# Patient Record
Sex: Male | Born: 1960 | Hispanic: Yes | Marital: Married | Smoking: Never smoker
Health system: Southern US, Community
[De-identification: ages and names within clinical notes are randomized; demographics above are authoritative.]

## PROBLEM LIST (undated history)

## (undated) DIAGNOSIS — E669 Obesity, unspecified: Secondary | ICD-10-CM

## (undated) DIAGNOSIS — M25512 Pain in left shoulder: Secondary | ICD-10-CM

## (undated) DIAGNOSIS — R61 Generalized hyperhidrosis: Secondary | ICD-10-CM

## (undated) DIAGNOSIS — N62 Hypertrophy of breast: Secondary | ICD-10-CM

## (undated) DIAGNOSIS — E785 Hyperlipidemia, unspecified: Secondary | ICD-10-CM

## (undated) DIAGNOSIS — K219 Gastro-esophageal reflux disease without esophagitis: Secondary | ICD-10-CM

## (undated) DIAGNOSIS — J45909 Unspecified asthma, uncomplicated: Secondary | ICD-10-CM

## (undated) DIAGNOSIS — G4730 Sleep apnea, unspecified: Secondary | ICD-10-CM

## (undated) DIAGNOSIS — R0602 Shortness of breath: Secondary | ICD-10-CM

## (undated) DIAGNOSIS — I10 Essential (primary) hypertension: Secondary | ICD-10-CM

## (undated) DIAGNOSIS — E119 Type 2 diabetes mellitus without complications: Secondary | ICD-10-CM

## (undated) DIAGNOSIS — Z9989 Dependence on other enabling machines and devices: Secondary | ICD-10-CM

## (undated) DIAGNOSIS — G4733 Obstructive sleep apnea (adult) (pediatric): Secondary | ICD-10-CM

## (undated) DIAGNOSIS — S3991XA Unspecified injury of abdomen, initial encounter: Secondary | ICD-10-CM

## (undated) HISTORY — DX: Pain in left shoulder: M25.512

## (undated) HISTORY — DX: Obstructive sleep apnea (adult) (pediatric): G47.33

## (undated) HISTORY — DX: Obesity, unspecified: E66.9

## (undated) HISTORY — DX: Shortness of breath: R06.02

## (undated) HISTORY — DX: Unspecified asthma, uncomplicated: J45.909

## (undated) HISTORY — DX: Type 2 diabetes mellitus without complications: E11.9

## (undated) HISTORY — DX: Hyperlipidemia, unspecified: E78.5

## (undated) HISTORY — DX: Gastro-esophageal reflux disease without esophagitis: K21.9

## (undated) HISTORY — DX: Unspecified injury of abdomen, initial encounter: S39.91XA

## (undated) HISTORY — DX: Sleep apnea, unspecified: G47.30

## (undated) HISTORY — DX: Generalized hyperhidrosis: R61

## (undated) HISTORY — DX: Dependence on other enabling machines and devices: Z99.89

## (undated) HISTORY — DX: Hypertrophy of breast: N62

---

## 2005-03-06 ENCOUNTER — Ambulatory Visit: Payer: Self-pay | Admitting: Internal Medicine

## 2005-03-07 ENCOUNTER — Ambulatory Visit: Payer: Self-pay | Admitting: Internal Medicine

## 2005-03-11 ENCOUNTER — Encounter: Admission: RE | Admit: 2005-03-11 | Discharge: 2005-03-11 | Payer: Self-pay | Admitting: Internal Medicine

## 2005-03-11 ENCOUNTER — Ambulatory Visit: Payer: Self-pay | Admitting: Internal Medicine

## 2005-04-03 ENCOUNTER — Ambulatory Visit: Payer: Self-pay | Admitting: Cardiology

## 2005-04-05 ENCOUNTER — Ambulatory Visit: Payer: Self-pay

## 2005-04-08 ENCOUNTER — Ambulatory Visit: Payer: Self-pay

## 2005-04-12 ENCOUNTER — Ambulatory Visit: Payer: Self-pay | Admitting: Cardiology

## 2005-04-16 ENCOUNTER — Ambulatory Visit: Payer: Self-pay | Admitting: Internal Medicine

## 2005-04-16 ENCOUNTER — Ambulatory Visit: Payer: Self-pay | Admitting: Cardiology

## 2005-04-16 ENCOUNTER — Ambulatory Visit (HOSPITAL_COMMUNITY): Admission: RE | Admit: 2005-04-16 | Discharge: 2005-04-16 | Payer: Self-pay | Admitting: Internal Medicine

## 2005-04-22 ENCOUNTER — Ambulatory Visit: Payer: Self-pay | Admitting: Cardiology

## 2005-04-22 ENCOUNTER — Inpatient Hospital Stay (HOSPITAL_BASED_OUTPATIENT_CLINIC_OR_DEPARTMENT_OTHER): Admission: RE | Admit: 2005-04-22 | Discharge: 2005-04-22 | Payer: Self-pay | Admitting: Cardiology

## 2005-05-08 ENCOUNTER — Ambulatory Visit: Payer: Self-pay | Admitting: Cardiology

## 2005-05-20 ENCOUNTER — Ambulatory Visit: Payer: Self-pay | Admitting: Hospitalist

## 2005-06-03 ENCOUNTER — Ambulatory Visit: Payer: Self-pay | Admitting: Internal Medicine

## 2006-06-07 DIAGNOSIS — R079 Chest pain, unspecified: Secondary | ICD-10-CM | POA: Insufficient documentation

## 2006-06-07 DIAGNOSIS — R0602 Shortness of breath: Secondary | ICD-10-CM | POA: Insufficient documentation

## 2006-06-07 DIAGNOSIS — E785 Hyperlipidemia, unspecified: Secondary | ICD-10-CM | POA: Insufficient documentation

## 2006-06-07 DIAGNOSIS — N62 Hypertrophy of breast: Secondary | ICD-10-CM | POA: Insufficient documentation

## 2006-09-03 ENCOUNTER — Telehealth: Payer: Self-pay | Admitting: Internal Medicine

## 2006-09-29 ENCOUNTER — Ambulatory Visit: Payer: Self-pay | Admitting: Hospitalist

## 2006-09-29 ENCOUNTER — Encounter (INDEPENDENT_AMBULATORY_CARE_PROVIDER_SITE_OTHER): Payer: Self-pay | Admitting: Unknown Physician Specialty

## 2006-09-29 DIAGNOSIS — R61 Generalized hyperhidrosis: Secondary | ICD-10-CM

## 2006-09-29 DIAGNOSIS — E1159 Type 2 diabetes mellitus with other circulatory complications: Secondary | ICD-10-CM | POA: Insufficient documentation

## 2006-09-29 DIAGNOSIS — I10 Essential (primary) hypertension: Secondary | ICD-10-CM

## 2006-09-29 DIAGNOSIS — M25519 Pain in unspecified shoulder: Secondary | ICD-10-CM

## 2006-09-29 LAB — CONVERTED CEMR LAB
ALT: 41 units/L (ref 0–53)
Albumin: 4.2 g/dL (ref 3.5–5.2)
CO2: 25 meq/L (ref 19–32)
Glucose, Bld: 91 mg/dL (ref 70–99)
LDL Cholesterol: 78 mg/dL (ref 0–99)
Potassium: 4 meq/L (ref 3.5–5.3)
Sodium: 140 meq/L (ref 135–145)
TSH: 1.215 microintl units/mL (ref 0.350–5.50)
Total Protein: 7.2 g/dL (ref 6.0–8.3)

## 2006-10-28 ENCOUNTER — Ambulatory Visit: Payer: Self-pay | Admitting: Pulmonary Disease

## 2006-11-18 ENCOUNTER — Ambulatory Visit (HOSPITAL_BASED_OUTPATIENT_CLINIC_OR_DEPARTMENT_OTHER): Admission: RE | Admit: 2006-11-18 | Discharge: 2006-11-18 | Payer: Self-pay | Admitting: Pulmonary Disease

## 2006-11-18 ENCOUNTER — Ambulatory Visit: Payer: Self-pay | Admitting: Pulmonary Disease

## 2006-12-18 ENCOUNTER — Ambulatory Visit: Payer: Self-pay | Admitting: Pulmonary Disease

## 2007-01-19 ENCOUNTER — Ambulatory Visit (HOSPITAL_BASED_OUTPATIENT_CLINIC_OR_DEPARTMENT_OTHER): Admission: RE | Admit: 2007-01-19 | Discharge: 2007-01-19 | Payer: Self-pay | Admitting: Pulmonary Disease

## 2007-01-19 ENCOUNTER — Ambulatory Visit: Payer: Self-pay | Admitting: Pulmonary Disease

## 2007-03-05 ENCOUNTER — Ambulatory Visit: Payer: Self-pay | Admitting: Pulmonary Disease

## 2007-05-21 ENCOUNTER — Encounter (INDEPENDENT_AMBULATORY_CARE_PROVIDER_SITE_OTHER): Payer: Self-pay | Admitting: *Deleted

## 2007-11-17 ENCOUNTER — Telehealth: Payer: Self-pay | Admitting: *Deleted

## 2007-11-27 ENCOUNTER — Encounter (INDEPENDENT_AMBULATORY_CARE_PROVIDER_SITE_OTHER): Payer: Self-pay | Admitting: *Deleted

## 2007-11-27 ENCOUNTER — Ambulatory Visit: Payer: Self-pay | Admitting: Internal Medicine

## 2007-11-27 DIAGNOSIS — G4733 Obstructive sleep apnea (adult) (pediatric): Secondary | ICD-10-CM

## 2007-11-27 LAB — CONVERTED CEMR LAB
AST: 13 units/L (ref 0–37)
Albumin: 4.4 g/dL (ref 3.5–5.2)
BUN: 13 mg/dL (ref 6–23)
Calcium: 9.4 mg/dL (ref 8.4–10.5)
Chloride: 106 meq/L (ref 96–112)
Creatinine, Ser: 0.91 mg/dL (ref 0.40–1.50)
Potassium: 4.2 meq/L (ref 3.5–5.3)
Sodium: 142 meq/L (ref 135–145)
Total Protein: 7.9 g/dL (ref 6.0–8.3)

## 2007-12-02 ENCOUNTER — Ambulatory Visit: Payer: Self-pay | Admitting: Hospitalist

## 2007-12-02 ENCOUNTER — Encounter (INDEPENDENT_AMBULATORY_CARE_PROVIDER_SITE_OTHER): Payer: Self-pay | Admitting: *Deleted

## 2007-12-02 LAB — CONVERTED CEMR LAB
Cholesterol: 140 mg/dL (ref 0–200)
HDL: 36 mg/dL — ABNORMAL LOW (ref >39)
Triglycerides: 130 mg/dL (ref <150)

## 2007-12-04 ENCOUNTER — Telehealth: Payer: Self-pay | Admitting: *Deleted

## 2008-07-06 ENCOUNTER — Telehealth: Payer: Self-pay | Admitting: Internal Medicine

## 2008-07-12 ENCOUNTER — Telehealth: Payer: Self-pay | Admitting: Internal Medicine

## 2008-10-18 ENCOUNTER — Telehealth: Payer: Self-pay | Admitting: Internal Medicine

## 2009-01-18 ENCOUNTER — Telehealth: Payer: Self-pay | Admitting: Internal Medicine

## 2009-02-23 ENCOUNTER — Encounter: Payer: Self-pay | Admitting: Internal Medicine

## 2009-02-23 ENCOUNTER — Ambulatory Visit: Payer: Self-pay | Admitting: Internal Medicine

## 2009-02-23 ENCOUNTER — Ambulatory Visit (HOSPITAL_COMMUNITY): Admission: RE | Admit: 2009-02-23 | Discharge: 2009-02-23 | Payer: Self-pay | Admitting: Internal Medicine

## 2010-03-21 ENCOUNTER — Ambulatory Visit: Payer: Self-pay | Admitting: Internal Medicine

## 2010-03-21 ENCOUNTER — Encounter: Payer: Self-pay | Admitting: Internal Medicine

## 2010-03-21 DIAGNOSIS — K219 Gastro-esophageal reflux disease without esophagitis: Secondary | ICD-10-CM

## 2010-03-21 LAB — CONVERTED CEMR LAB
ALT: 33 units/L (ref 0–53)
Albumin: 4.3 g/dL (ref 3.5–5.2)
Alkaline Phosphatase: 90 units/L (ref 39–117)
BUN: 11 mg/dL (ref 6–23)
CO2: 26 meq/L (ref 19–32)
Calcium: 9.5 mg/dL (ref 8.4–10.5)
Chloride: 105 meq/L (ref 96–112)
Cholesterol: 201 mg/dL — ABNORMAL HIGH (ref 0–200)
Creatinine, Ser: 0.89 mg/dL (ref 0.40–1.50)
Potassium: 4.6 meq/L (ref 3.5–5.3)
Sodium: 141 meq/L (ref 135–145)
Total Protein: 7.4 g/dL (ref 6.0–8.3)
VLDL: 36 mg/dL (ref 0–40)

## 2010-04-10 ENCOUNTER — Encounter: Payer: Self-pay | Admitting: Internal Medicine

## 2010-05-11 ENCOUNTER — Encounter: Payer: Self-pay | Admitting: Internal Medicine

## 2010-09-02 ENCOUNTER — Encounter: Payer: Self-pay | Admitting: Internal Medicine

## 2010-09-09 LAB — CONVERTED CEMR LAB
ALT: 29 U/L
ALT: 29 units/L (ref 0–53)
AST: 14 U/L
Albumin: 4.3 g/dL
Albumin: 4.3 g/dL (ref 3.5–5.2)
Alkaline Phosphatase: 94 U/L
BUN: 9 mg/dL
CO2: 26 meq/L
CO2: 26 meq/L (ref 19–32)
Calcium: 9.4 mg/dL
Calcium: 9.4 mg/dL (ref 8.4–10.5)
Chloride: 104 meq/L
Cholesterol: 147 mg/dL
Cholesterol: 147 mg/dL (ref 0–200)
Creatinine, Ser: 0.92 mg/dL
Glucose, Bld: 88 mg/dL
Glucose, Bld: 88 mg/dL (ref 70–99)
HCT: 49 %
HCT: 49 % (ref 39.0–52.0)
HDL: 41 mg/dL
Hemoglobin: 15.7 g/dL
Hemoglobin: 15.7 g/dL (ref 13.0–17.0)
LDL Cholesterol: 89 mg/dL
Lipase: 13 U/L
Lipase: 13 units/L (ref 0–75)
MCHC: 32 g/dL
MCV: 84.2 fL
Platelets: 273 K/uL
Potassium: 4.5 meq/L
RBC: 5.82 M/uL — ABNORMAL HIGH
RDW: 14.9 %
Sodium: 142 meq/L
Sodium: 142 meq/L (ref 135–145)
TSH: 0.774 u[IU]/mL
Total Bilirubin: 0.7 mg/dL
Total Bilirubin: 0.7 mg/dL (ref 0.3–1.2)
Total CHOL/HDL Ratio: 3.6
Total CHOL/HDL Ratio: 3.6
Total Protein: 7.2 g/dL
Total Protein: 7.2 g/dL (ref 6.0–8.3)
Triglycerides: 87 mg/dL
VLDL: 17 mg/dL
VLDL: 17 mg/dL (ref 0–40)
WBC: 11.7 10*3/microliter — ABNORMAL HIGH

## 2010-09-11 NOTE — Consult Note (Signed)
Summary: [facility]   Imported By: Louretta Parma 05/24/2010 15:57:11  _____________________________________________________________________  External Attachment:    Type:   Image     Comment:   External Document
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
## 2010-09-11 NOTE — Consult Note (Signed)
Summary: [facility]   Imported By: Louretta Parma 06/13/2010 12:21:48  _____________________________________________________________________  External Attachment:    Type:   Image     Comment:   External Document

## 2010-10-09 ENCOUNTER — Other Ambulatory Visit: Payer: Self-pay | Admitting: *Deleted

## 2010-10-09 MED ORDER — METOPROLOL TARTRATE 25 MG PO TABS: 25.0000 mg | ORAL_TABLET | Freq: Two times a day (BID) | ORAL | Status: DC

## 2010-10-09 MED ORDER — PRAVASTATIN SODIUM 80 MG PO TABS: 80.0000 mg | ORAL_TABLET | Freq: Every day | ORAL | Status: DC

## 2010-10-09 NOTE — Telephone Encounter (Signed)
Pt is 6 months overdue for appt, his daughter was instructed to make an appt for asap

## 2010-11-13 ENCOUNTER — Encounter: Payer: Self-pay | Admitting: Internal Medicine

## 2010-11-21 ENCOUNTER — Ambulatory Visit (INDEPENDENT_AMBULATORY_CARE_PROVIDER_SITE_OTHER): Payer: PRIVATE HEALTH INSURANCE | Admitting: Internal Medicine

## 2010-11-21 ENCOUNTER — Encounter: Payer: Self-pay | Admitting: Internal Medicine

## 2010-11-21 DIAGNOSIS — D72829 Elevated white blood cell count, unspecified: Secondary | ICD-10-CM

## 2010-11-21 DIAGNOSIS — E785 Hyperlipidemia, unspecified: Secondary | ICD-10-CM

## 2010-11-21 DIAGNOSIS — I10 Essential (primary) hypertension: Secondary | ICD-10-CM

## 2010-11-21 DIAGNOSIS — L83 Acanthosis nigricans: Secondary | ICD-10-CM

## 2010-11-21 DIAGNOSIS — K219 Gastro-esophageal reflux disease without esophagitis: Secondary | ICD-10-CM

## 2010-11-21 LAB — COMPREHENSIVE METABOLIC PANEL
Albumin: 4.3 g/dL (ref 3.5–5.2)
Calcium: 9.4 mg/dL (ref 8.4–10.5)
Total Protein: 6.9 g/dL (ref 6.0–8.3)

## 2010-11-21 LAB — GLUCOSE, CAPILLARY: Glucose-Capillary: 100 mg/dL — ABNORMAL HIGH (ref 70–99)

## 2010-11-21 MED ORDER — ATORVASTATIN CALCIUM 20 MG PO TABS: 20.0000 mg | ORAL_TABLET | Freq: Every day | ORAL | Status: DC

## 2010-11-21 MED ORDER — PANTOPRAZOLE SODIUM 40 MG PO TBEC: 40.0000 mg | DELAYED_RELEASE_TABLET | Freq: Every day | ORAL | Status: DC

## 2010-11-21 NOTE — Assessment & Plan Note (Signed)
Patient has been having acanthosis nigrans (AN) for the past 3 months, Some people are born with AN. It is sometimes caused by a hormonal or glandular disorder, such as diabetes. Eating too much of the wrong foods, especially starches and sugars, raises insulin levels. Most patients with AN have a high insulin level. Increased insulin activates insulin receptors in the skin and forces them to grow abnormally. This may help cause AN. Reducing insulin by a special diet can lead to a rapid improvement of the skin problem. Both sexes are affected equally. Rarely, AN is associated with a tumor. The type of AN associated with malignancy more often occurs in elderly people. However, cases have been reported in children with a rare kidney cancer called Wilms' tumor. Malignant AN affects all races equally.  We'll check a basic metabolic panel, complete blood count and liver function tests as a basic start, and we'll act further based on the results.

## 2010-11-21 NOTE — Assessment & Plan Note (Signed)
The patient was on pravastatin, however his LDL remains uncontrolled, I switched him to Lipitor, we'll recheck his fasting lipid panel in several months.

## 2010-11-21 NOTE — Assessment & Plan Note (Signed)
Patient has not been taking his medications regularly, I have encouraged patient to take metoprolol twice a day we'll reevaluate her next followup as patient may need adjustment of medication to obtain a target blood pressure

## 2010-11-21 NOTE — Progress Notes (Signed)
  Subjective:    Patient ID: Brian Reilly, male    DOB: November 12, 1960, 50 y.o.   MRN: [id number]  HPI  Patient is a 50 year old male with a past medical history below presents to clinic for routine followup, today he complains of darkening under his axillary region consistent with acanthosis nigricans. The patient is nondiabetic. Denies any fever chills, denies any weight loss or any other red flag symptoms.   Review of Systems  [all other systems reviewed and are negative       Objective:   Physical Exam  Constitutional: He is oriented to person, place, and time. He appears well-developed and well-nourished.  HENT:  Head: Normocephalic and atraumatic.  Eyes: Pupils are equal, round, and reactive to light.  Neck: Normal range of motion. No JVD present. No thyromegaly present.  Cardiovascular: Normal rate, regular rhythm and normal heart sounds.   Pulmonary/Chest: Effort normal and breath sounds normal. He has no wheezes. He has no rales.  Abdominal: Soft. Bowel sounds are normal. There is no tenderness. There is no rebound.  Musculoskeletal: Normal range of motion. He exhibits no edema.  Neurological: He is alert and oriented to person, place, and time.  Skin: Skin is warm and dry.             Assessment & Plan:

## 2010-11-21 NOTE — Patient Instructions (Signed)
Acanthosis Nigricans (AN) Acanthosis nigricans (AN) is a disorder that may begin at any age, including birth. It causes velvety, light brown, black, or grayish markings on the skin. They are usually found on the:  Face.   Neck.   Armpits.   Inner thighs.   Groin.  AN can be noncancerous (benign) or associated with cancer (malignant). Most often, AN is a benign condition. Benign AN is primarily associated with being overweight. In young people, insulin resistance is the most common association with AN. Insulin is the hormone that controls your blood sugar. Insulin resistance occurs when the body does not use its insulin properly. Benign AN may cause social problems, since the person may appear as if he or she has poor hygiene.  CAUSES Some people are born with AN. It is sometimes caused by a hormonal or glandular disorder, such as diabetes. Eating too much of the wrong foods, especially starches and sugars, raises insulin levels. Most patients with AN have a high insulin level. Increased insulin activates insulin receptors in the skin and forces them to grow abnormally. This may help cause AN. Reducing insulin by a special diet can lead to a rapid improvement of the skin problem. Both sexes are affected equally. Rarely, AN is associated with a tumor. The type of AN associated with malignancy more often occurs in elderly people. However, cases have been reported in children with a rare kidney cancer called Wilms' tumor. Malignant AN affects all races equally. SYMPTOMS AN usually does not cause symptoms. Most people who have AN are bothered primarily by its appearance. DIAGNOSIS When AN develops in people who are not overweight, medical tests are often done to find the cause. When AN is associated with malignancy, it is unusually severe. In those cases, AN can be seen in additional places, such as the lips or hands. AN associated with malignancy is linked to major problems because it is caused by the  presence of a cancer. The tumor is often aggressive and destructive. Benign AN has a good outcome. It is easily treated with good results. TREATMENT  Treatment to improve the appearance of AN includes prescription medicines (retinoids, 20% urea, alpha hydroxy acids, salicylic acid).   If overweight, avoiding starchy foods and sugars that raise the insulin level can help. Losing weight will also help decrease the appearance of AN tremendously.   Oral medicines are available that help decrease high insulin.  HOME CARE INSTRUCTIONS  If you are overweight, exercise and watch your diet to lose the extra weight.   Use medicines prescribed by your caregiver as instructed.  SEEK MEDICAL CARE IF: You develop an unexplained case of AN in adulthood. Document Released: 07/29/2005 Document Re-Released: 01/16/2010 [facility] Patient Information 2011 [place], [place].

## 2010-11-22 LAB — CBC WITH DIFFERENTIAL/PLATELET
Basophils Absolute: 0 10*3/uL (ref 0.0–0.1)
Basophils Relative: 0 % (ref 0–1)
Eosinophils Relative: 3 % (ref 0–5)
Lymphocytes Relative: 28 % (ref 12–46)
Lymphs Abs: 3.3 10*3/uL (ref 0.7–4.0)
MCH: 27.2 pg (ref 26.0–34.0)
MCHC: 32.8 g/dL (ref 30.0–36.0)
Neutrophils Relative %: 60 % (ref 43–77)
Platelets: 269 10*3/uL (ref 150–400)

## 2010-12-25 NOTE — Assessment & Plan Note (Signed)
[facility]                             PULMONARY OFFICE NOTE   Brian Reilly, Brian Reilly                      MRN:          [id number]  DATE:03/05/2007                            DOB:          26-Dec-1960    I saw Brian Reilly in followup today for his severe obstructive sleep  apnea.   Since his last visit, he has undergone a CPAP titration study which was  done on January 19, 2007, and he was titrated to a pressure setting of 12  with a reduction in his apnea-hypopnea index to 0.  He was observed in  both REM sleep and supine sleep at this pressure setting.   He has since been started on CPAP with a full face mask and heated  humidification.  He says that he feels like the mask is too tight around  his cheeks but otherwise is not having any difficulty using his mask.  He says he is using the machine on a nightly basis for the entire night  and has noticed a significant improvement in his sleep quality as well  as his energy level during the day.  He has also started a diet and  exercise program and has lost approximately 10 pounds since his initial  visit in May.  I have discussed with him various techniques to try and  improve the fitting of his mask.  If he is still having difficulty with  this, I have advised him to contact his home care company to try an  alternative mask.  Otherwise I have encouraged him to maintain  compliance with his CPAP as well as to keep up with his diet, exercise,  and weight reduction program.   I will follow up with him in 4-6 months.     Coralyn Helling, MD  Electronically Signed    VS/MedQ  DD: 03/05/2007  DT: 03/05/2007  Job #: [id number]   cc:   Artist Beach, MD

## 2010-12-25 NOTE — Assessment & Plan Note (Signed)
[facility]                             PULMONARY OFFICE NOTE   RIAD, WAGLEY                      MRN:          [id number]  DATE:12/18/2006                            DOB:          1960-10-06    PULMONARY FOLLOWUP VISIT   I saw Mr. Stratton in followup today after he had undergone his overnight  polysomnogram, which was done on November 18, 2006.   This showed that he had evidence for severe obstructive sleep apnea with  an overall apnea/hypopnea index of 51.9 and an oxygen nadir of 73%.  He  was scheduled for a split night study protocol, but did not meet  protocol criteria.   I reviewed the results of his sleep study with him.  I had discussed the  adverse consequences of untreated sleep apnea, including increased risk  of hypertension, coronary artery disease, cerebrovascular disease, and  diabetes.  I had also discussed with him the importance of diet,  exercise, and weight reduction, as well as the avoidance of alcohol and  sedatives.  Driving precautions were reviewed as well.  I had also  reviewed various treatment options for his sleep apnea, including CPAP  therapy, oral appliance, and surgical intervention.  Given the severity  of his sleep apnea with the significance of his oxygen desaturation, I  feel that CPAP therapy would be his best option.   I will, therefore, refer him back to the sleep lab for a CPAP titration  study.  I will initiate him on CPAP after review of this, and follow up  with him in the office to discuss this issue further.     Coralyn Helling, MD  Electronically Signed    VS/MedQ  DD: 12/18/2006  DT: 12/18/2006  Job #: [id number]   cc:   Artist Beach, MD

## 2010-12-25 NOTE — Procedures (Signed)
NAME:  Brian Reilly, POUNDERS NO.:  [id number]   MEDICAL RECORD NO.:  [id number]          PATIENT TYPE:  OUT   LOCATION:  SLEEP CENTER                 FACILITY:  [facility]   PHYSICIAN:  Coralyn Helling, MD        DATE OF BIRTH:  [birth year]   DATE OF STUDY:  01/19/2007                            NOCTURNAL POLYSOMNOGRAM   REFERRING PHYSICIAN:  Coralyn Helling, MD   INDICATION FOR STUDY:  This is an individual who had undergone an  overnight polysomnogram November 18, 2006 and was found to have an  apnea/hypopnea index of 51.9 and an oxygen saturation nadir of 73%.  He  is referred to the sleep lab for a CPAP titration study.   EPWORTH SLEEPINESS SCORE:  Fifteen.   MEDICATIONS:  Protonix, nitroglycerin, Lipitor, aspirin, and Metoprolol.   SLEEP ARCHITECTURE:  Total recording time was 453 minutes.  Total sleep  time was 306 minutes.  Sleep efficiency was 68% which is reduced.  Sleep  latency is 21 minutes. REM latency was 153 minutes.  The study was  notable for the lack of slow wave sleep.  The patient appeared to have  difficulty with sleep initiation initially due to respiratory events.  The patient slept in both the supine and non supine position.   RESPIRATORY DATA:  The average respiratory was 12.  The patient was  titrated from a CPAP pressure setting of 6 to 12 cm of water.  At a CPAP  pressure setting of 12 cm of water the apnea/hypopnea index was reduced  to 0.  At this pressure setting he was observed in both REM sleep and  supine sleep and snoring was eliminated.  It did however appear that he  continued to have some degree of air flow limitation during REM sleep  but this did not meet criteria to score as an apneic or hypopneic event.   OXYGEN DATA:  The baseline oxygenation was 95%. The oxygen saturation  nadir was 89%.  At a CPAP pressure setting of 12 cm of water the mean  oxygenation during non REM sleep was 94.5%, the mean oxygenation during  REM sleep was 94.5%, the  minimal oxygenation during non REM sleep was  92%, and the minimal oxygenation during REM sleep was 92%.   CARDIAC DATA:  The average heart rate was 63 and the rhythm strip showed  normal sinus rhythm with sinus bradycardic and PACs.   MOVEMENT-PARASOMNIA:  The periodic limb movement index was 0.  No other  abnormal behaviors were noted.   IMPRESSIONS-RECOMMENDATIONS:  This is a CPAP titration study.  At a CPAP  pressure setting of 12 cm of water the apnea/hypopnea index was reduced  to 0. At this pressure setting the patient was observed in both REM  sleep and supine sleep.  He did appear to have continued air flow  limitation during REM sleep but this did not meet  criteria to score as an apneic/hypopneic event.  If he is still having  difficulty after being initiated on CPAP therapy then he may need to  have a further titration study done.      Coralyn Helling, MD  Diplomat,  American Board of Sleep Medicine  Electronically Signed     VS/MEDQ  D:  01/24/2007 11:34:03  T:  01/24/2007 21:52:34  Job:  [id number]

## 2010-12-28 NOTE — Procedures (Signed)
NAME:  Brian Reilly, Brian Reilly NO.:  [id number]   MEDICAL RECORD NO.:  [id number]          PATIENT TYPE:  OUT   LOCATION:  SLEEP CENTER                 FACILITY:  [facility]   PHYSICIAN:  Coralyn Helling, MD        DATE OF BIRTH:  [birth year]   DATE OF STUDY:  11/18/2006                            NOCTURNAL POLYSOMNOGRAM   REFERRING PHYSICIAN:  Coralyn Helling, MD   FACILITY:  [facility].   INDICATION FOR STUDY:  This individual has a history of hypertension,  snoring, and excessive daytime sleepiness.  He is referred to the sleep  lab for evaluation of hypersomnia with obstructive sleep apnea.   EPWORTH SLEEPINESS SCORE:  15.   MEDICATIONS:  Protonix, Lipitor, metoprolol, nitroglycerin, and aspirin.   SLEEP ARCHITECTURE:  Total recording time was 475 minutes.  Total sleep  time was 329 minutes.  Sleep efficiency was 69% which is reduced.  Sleep  latency was 9.5 minutes. REM latency was 306 minutes which is prolonged.  This study was notable for a lack of slow wave sleep and a reduction in  the percentage of REM sleep to 12% of the study.  The patient was  scheduled for a split night study protocol; however, due to insufficient  sleep during the first half of the study, he did not meet protocol  criteria.   RESPIRATORY DATA:  The average respiratory rate was 15.  The overall  apnea/hypopnea index was 51.9.  The events were exclusively obstructive  in nature.  Loud snoring was noted by the technician.  The non-supine  Apnea/hypopnea index was 55.6.  The supine Apnea/hypopnea index was  34.9.  The non-REM Apnea/hypopnea index was 47.1.  The REM  Apnea/hypopnea index was 85.9.   OXYGEN DATA:  The baseline oxygenation was 92%.  The oxygen saturation  nadir was 73%.  The patient spent a total of 369 minutes with an oxygen  saturation between 91-100%, 104 minutes with an oxygen saturation  between 81-90%, and 0.4 minutes with an oxygen saturation between 71-  80%.   CARDIAC DATA:  The average heart rate was 56, and the rhythm strip  showed normal sinus rhythm with sinus bradycardia.   MOVEMENT-PARASOMNIA:  The periodic limb movement index was zero.   IMPRESSIONS-RECOMMENDATIONS:  This study shows evidence for severe  obstructive sleep apnea as demonstrated by an Apnea/hypopnea index of  51.9 and oxygen saturation nadir of 73%.  The patient should be  counseled with regards to the importance of diet, exercise, and weight  reduction.  Given the severity of his sleep  apnea in addition to the significant oxygen desaturations, consideration  should be given to having patient undergo CPAP therapy.      Coralyn Helling, MD  Diplomat, American Board of Sleep Medicine  Electronically Signed     VS/MEDQ  D:  11/23/2006 15:03:16  T:  11/23/2006 17:13:15  Job:  [id number]

## 2010-12-28 NOTE — Cardiovascular Report (Signed)
NAME:  Brian Reilly, TOOKER NO.:  [id number]   MEDICAL RECORD NO.:  [id number]          PATIENT TYPE:  OIB   LOCATION:  6501                         FACILITY:  [facility]   PHYSICIAN:  Rollene Rotunda, M.D.   DATE OF BIRTH:  1961-07-15   DATE OF PROCEDURE:  04/22/2005  DATE OF DISCHARGE:                              CARDIAC CATHETERIZATION   PRIMARY CARE PHYSICIAN:  Artist Beach, M.D., [facility].   PROCEDURE:  Left heart catheterization/coronary arteriography.   INDICATION:  Patient with an abnormal Cardiolite, suggesting mild ischemia  inferior wall at the base.  The patient had chest pain.   DESCRIPTION OF PROCEDURE:  Left heart catheterization was performed via the  right femoral artery and the artery was cannulated using an anterior wall  puncture.  A #4 French arterial sheath was inserted via modified Seldinger  technique.  Preformed Judkins and pigtail catheters were utilized.  The  patient tolerated the procedure well and left the lab in stable condition.   HEMODYNAMIC RESULTS:  LV 124/102, aorta 129/88.   Coronaries:  The left main was normal.  The LAD was normal.  The first  diagonal was large and normal.  The second diagonal was small and normal.  The third diagonal was small and normal.  The circumflex in the AV groove  was normal.  There was a large ramus intermedius which was normal.  The OM1  was large and normal.  The OM2 was large and normal.  The right coronary  artery was a large dominant vessel.  There was a moderate sized PDA which  was normal.  There was a moderate to small posterolateral which was normal.   LEFT VENTRICULOGRAM:  A left ventriculogram was obtained in the RAO  projection.  EF was 65% with normal wall motion.   CONCLUSION:  Normal coronaries.  Normal left ventricular function.   PLAN:  No further cardiac workup is suggested.  The patient can return to  their primary care physician for further workup of  non-anginal chest  discomfort.  He should continue aggressive coronary risk reduction.           ______________________________  Rollene Rotunda, M.D.     JH/MEDQ  D:  04/22/2005  T:  04/22/2005  Job:  [id number]
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
## 2011-06-07 ENCOUNTER — Other Ambulatory Visit: Payer: Self-pay | Admitting: Internal Medicine

## 2011-07-05 ENCOUNTER — Other Ambulatory Visit: Payer: Self-pay | Admitting: Internal Medicine

## 2011-07-09 ENCOUNTER — Other Ambulatory Visit: Payer: Self-pay | Admitting: *Deleted

## 2011-07-10 MED ORDER — METOPROLOL TARTRATE 25 MG PO TABS: 25.0000 mg | ORAL_TABLET | Freq: Two times a day (BID) | ORAL | Status: DC

## 2011-10-01 ENCOUNTER — Other Ambulatory Visit: Payer: Self-pay | Admitting: Internal Medicine

## 2011-10-16 ENCOUNTER — Ambulatory Visit (INDEPENDENT_AMBULATORY_CARE_PROVIDER_SITE_OTHER): Payer: PRIVATE HEALTH INSURANCE | Admitting: Internal Medicine

## 2011-10-16 ENCOUNTER — Encounter: Payer: Self-pay | Admitting: Internal Medicine

## 2011-10-16 VITALS — BP 140/83 | HR 66 | Temp 98.4°F | Ht 74.0 in | Wt 358.7 lb

## 2011-10-16 DIAGNOSIS — K219 Gastro-esophageal reflux disease without esophagitis: Secondary | ICD-10-CM

## 2011-10-16 DIAGNOSIS — E785 Hyperlipidemia, unspecified: Secondary | ICD-10-CM

## 2011-10-16 DIAGNOSIS — I10 Essential (primary) hypertension: Secondary | ICD-10-CM

## 2011-10-16 LAB — COMPLETE METABOLIC PANEL WITH GFR
ALT: 25 U/L (ref 0–53)
AST: 15 U/L (ref 0–37)
Alkaline Phosphatase: 103 U/L (ref 39–117)
BUN: 8 mg/dL (ref 6–23)
CO2: 24 mEq/L (ref 19–32)
Calcium: 9.1 mg/dL (ref 8.4–10.5)
Chloride: 106 mEq/L (ref 96–112)
Creat: 0.73 mg/dL (ref 0.50–1.35)
GFR, Est Non African American: >89 mL/min
Glucose, Bld: 106 mg/dL — ABNORMAL HIGH (ref 70–99)
Sodium: 142 mEq/L (ref 135–145)
Total Protein: 6.8 g/dL (ref 6.0–8.3)

## 2011-10-16 LAB — LIPID PANEL
HDL: 39 mg/dL — ABNORMAL LOW (ref >39)
Total CHOL/HDL Ratio: 3.9 Ratio
Triglycerides: 92 mg/dL (ref <150)

## 2011-10-16 MED ORDER — PANTOPRAZOLE SODIUM 40 MG PO TBEC: 40.0000 mg | DELAYED_RELEASE_TABLET | Freq: Every day | ORAL | Status: DC

## 2011-10-16 MED ORDER — METOPROLOL TARTRATE 25 MG PO TABS: 25.0000 mg | ORAL_TABLET | Freq: Two times a day (BID) | ORAL | Status: DC

## 2011-10-16 MED ORDER — SIMVASTATIN 40 MG PO TABS: 40.0000 mg | ORAL_TABLET | Freq: Every evening | ORAL | Status: DC

## 2011-10-16 NOTE — Assessment & Plan Note (Signed)
Refill medication today. 

## 2011-10-16 NOTE — Progress Notes (Signed)
  Subjective:    Patient ID: Brian Reilly, male    DOB: 1960-12-29, 51 y.o.   MRN: [id number]  HPI  Mr Brian Reilly is here for a regular check up.  His BP is well controlled at this time.  Patient had difficulty with metoprolol refills and is requesting a year supply.   Review of Systems  Constitutional: Negative for fever, activity change and appetite change.  HENT: Negative for sore throat.   Respiratory: Negative for cough and shortness of breath.   Cardiovascular: Negative for chest pain and leg swelling.  Gastrointestinal: Negative for nausea, abdominal pain, diarrhea, constipation and abdominal distention.  Genitourinary: Negative for frequency, hematuria and difficulty urinating.  Neurological: Negative for dizziness and headaches.  Psychiatric/Behavioral: Negative for suicidal ideas and behavioral problems.       Objective:   Physical Exam  Constitutional: He is oriented to person, place, and time. He appears well-developed and well-nourished.  HENT:  Head: Normocephalic and atraumatic.  Eyes: Conjunctivae and EOM are normal. Pupils are equal, round, and reactive to light. No scleral icterus.  Neck: Normal range of motion. Neck supple. No JVD present. No thyromegaly present.  Cardiovascular: Normal rate, regular rhythm, normal heart sounds and intact distal pulses.  Exam reveals no gallop and no friction rub.   No murmur heard. Pulmonary/Chest: Effort normal and breath sounds normal. No respiratory distress. He has no wheezes. He has no rales.  Abdominal: Soft. Bowel sounds are normal. He exhibits no distension and no mass. There is no tenderness. There is no rebound and no guarding.  Musculoskeletal: Normal range of motion. He exhibits no edema and no tenderness.  Lymphadenopathy:    He has no cervical adenopathy.  Neurological: He is alert and oriented to person, place, and time.  Psychiatric: He has a normal mood and affect. His behavior is normal.            Assessment & Plan:

## 2011-10-16 NOTE — Patient Instructions (Signed)
Obesidad (Obesity) Se define la obesidad como tener un ndice de [facility]) de 30 o ms. Para calcular su IMC, divida su peso en libras sobre su altura en pulgadas al cuadrado y multiplique eso por 703. Las enfermedades ms importantes relacionadas con la obesidad a largo plazo incluyen:  Ictus.   Enfermedades cardacas.   Diabetes.   Distintos tipo de cncer.   Artritis.  La obesidad tambin complica la recuperacin de otros problemas mdicos.  CAUSAS  Historia de obesidad en sus padres.   Desequilibrio de hormona tiroidea.   Factores ambientales tales como el exceso de ingesta de caloras y sedentarismo.  TRATAMIENTO Un programa de prdida de peso saludable incluye:  Una dieta baja en caloras basada en las necesidades calricas individuales.   Aumento de la actividad fsica (ejercicio).  Un programa de ejercicios es tan importante como una dieta baja en caloras.  Los medicamentos para bajar de peso deberan utilizarse slo bajo supervisin de su mdico. Estos medicamentos ayudan, pero slo si se utilizan con programas ejercicio y Production assistant, radio. Los medicamentos pueden tener efectos adversos que incluyen nerviosismo, nuseas, dolor abdominal, diarrea, dolor de [place], adormecimiento y depresin.  Un programa de prdida de peso no saludable incluye:  Ayunar.   Programas de dieta que estn a la moda.   Suplementos y drogas.  Estas opciones fallan al lograr controlar el peso a largo plazo.  INSTRUCCIONES PARA EL CUIDADO DOMICILIARIO Para ayudarlo a realizar los cambios necesarios en su dieta:   Realice los ejercicios slo en la forma indicada por el profesional que lo asiste.   Mantenga un registro diario de todo lo que come. Existen muchos sitios web gratuitos que lo ayudarn a Administrator, sports. Puede ser til [employer] que consume para determinar si las porciones que est comiendo son del tamao correcto.   Utilice recetarios de alimentos bajos en caloras o tome  clases especiales de cocina.   Evite el alcohol. Tome ms agua y bebidas sin caloras.   Tome las vitaminas y los suplementos segn se lo ha prescrito el profesional que lo asiste.   Los grupos de apoyo de prdida de [place], los dietistas registrados, orientadores y Publishing rights manager para reduccin del estrs tambin puede ser de [place].  Document Released: 07/29/2005 Document Revised: 07/18/2011 [facility] Patient Information 2012 [place], [place].

## 2011-10-16 NOTE — Assessment & Plan Note (Signed)
Well controlled. BMP today. Patient advised to lose weight. RTC in 1 year.

## 2011-10-16 NOTE — Assessment & Plan Note (Signed)
Check lipid profile today and hepatic function. Will change to zocor 40 as patient is concerned about the cost of medication.

## 2012-02-08 ENCOUNTER — Ambulatory Visit (INDEPENDENT_AMBULATORY_CARE_PROVIDER_SITE_OTHER): Payer: Commercial Managed Care - PPO | Admitting: Internal Medicine

## 2012-02-08 VITALS — BP 148/93 | HR 70 | Temp 98.4°F | Resp 24 | Ht 74.0 in | Wt 359.0 lb

## 2012-02-08 DIAGNOSIS — I10 Essential (primary) hypertension: Secondary | ICD-10-CM

## 2012-02-08 DIAGNOSIS — R079 Chest pain, unspecified: Secondary | ICD-10-CM

## 2012-02-08 DIAGNOSIS — M79601 Pain in right arm: Secondary | ICD-10-CM

## 2012-02-08 DIAGNOSIS — R209 Unspecified disturbances of skin sensation: Secondary | ICD-10-CM

## 2012-02-08 LAB — COMPREHENSIVE METABOLIC PANEL
ALT: 47 U/L (ref 0–53)
AST: 28 U/L (ref 0–37)
Albumin: 4.3 g/dL (ref 3.5–5.2)
Alkaline Phosphatase: 102 U/L (ref 39–117)
CO2: 27 mEq/L (ref 19–32)
Calcium: 9.4 mg/dL (ref 8.4–10.5)
Chloride: 105 mEq/L (ref 96–112)
Total Bilirubin: 0.8 mg/dL (ref 0.3–1.2)
Total Protein: 7.2 g/dL (ref 6.0–8.3)

## 2012-02-08 LAB — POCT CBC
Granulocyte percent: 61.4 %G (ref 37–80)
MCH, POC: 26.4 pg — AB (ref 27–31.2)
MID (cbc): 0.6 (ref 0–0.9)
POC Granulocyte: 5.8 (ref 2–6.9)
POC MID %: 6.8 %M (ref 0–12)
RBC: 6.03 M/uL (ref 4.69–6.13)

## 2012-02-08 MED ORDER — MELOXICAM 15 MG PO TABS: 15.0000 mg | ORAL_TABLET | Freq: Every day | ORAL | Status: DC

## 2012-02-08 MED ORDER — CYCLOBENZAPRINE HCL 10 MG PO TABS: 10.0000 mg | ORAL_TABLET | Freq: Every day | ORAL | Status: AC

## 2012-02-08 NOTE — Progress Notes (Addendum)
Subjective:    Patient ID: Brian Reilly, male    DOB: [birth year], 51 y.o.   MRN: [id number] Called to see patient urgently due to chest pain and numbness HPIThree-day history of chest tightness with numbness in the left shoulder arm and hand that comes and goes/also has some numbness in the right arm and hand intermittently Has mild dyspnea on exertion but this is unchanged History of hypertension and hyperlipidemia but no heart problems today History of sleep apnea uses mask Has morbid obesity Has reflux No history of diabetes Current symptoms do not include diaphoresis, actual chest pain, shortness of breath at rest, weakness or dizziness, voice changes, dysphasia, memory changes, loss of strength in the upper or lower extremities No history of a neck injury   Review of Systems Primary care [facility] Patient Active Problem List  Diagnosis  . HYPERLIPIDEMIA  . MORBID OBESITY  . OBSTRUCTIVE SLEEP APNEA  . HYPERTENSION, ESSENTIAL NOS  . GERD  . Acanthosis nigricans       Objective:   Physical Exam Filed Vitals:   02/08/12 0925  BP: 148/93  Pulse: 70  Temp: 98.4 F (36.9 C)  Resp: 24  Weight 359 pounds In no acute distress Pupils equal round reactive to light and accommodation/EOMs conjugate No thyromegaly Neck has discomfort with extension but has good flexion Mildly tender in the left trapezius Heart is regular without murmurs rubs or gallops Lungs are clear There is 1+ pitting edema of the lower Extremities without hyperpigmentation Peripheral pulses full Regulars 2 through 12 intact There is no decrease in sensation at the current moment to exam Of either arm Tinel's is negative Motor function good in all extremities Deep tendon reflexes are symmetrical Cerebellar intact Gait normal  EKG within normal limits Results for orders placed in visit on 02/08/12  POCT CBC      Component Value Range   WBC 9.4  4.6 - 10.2 K/uL   Lymph, poc 3.0   0.6 - 3.4   POC LYMPH PERCENT 31.8  10 - 50 %L   MID (cbc) 0.6  0 - 0.9   POC MID % 6.8  0 - 12 %M   POC Granulocyte 5.8  2 - 6.9   Granulocyte percent 61.4  37 - 80 %G   RBC 6.03  4.69 - 6.13 M/uL   Hemoglobin 15.9  14.1 - 18.1 g/dL   HCT, POC 40.9  81.1 - 53.7 %   MCV 87.1  80 - 97 fL   MCH, POC 26.4 (*) 27 - 31.2 pg   MCHC 30.3 (*) 31.8 - 35.4 g/dL   RDW, POC 91.4     Platelet Count, POC 225  142 - 424 K/uL   MPV 11.4  0 - 99.8 fL  GLUCOSE, POCT (MANUAL RESULT ENTRY)      Component Value Range   POC Glucose 119 (*) 70 - 99 mg/dl    Assessment & Plan:  Problem #1 chest pain Problem #2 paresthesias-uncertain etiology  Patient Instructions  Your EKG is normal and there is no indication of heart problems your neurological exam is intact and there is no indication of a stroke These intermittent symptoms suggests I nerve pressure and the most likely origin would be your neck with a bony problem like arthritis We have done 2 other tests to consider metabolic causes and we'll call you with those results in the next 2 days for mail you a letter if everything is normal If the medications  prescribed do not work quickly, then the next step would be a neurological evaluation And you may arrange this by going back to the internal medicine clinic at [facility] to see your regular doctor  Awaiting TSH and CMET Meds ordered this encounter  Medications  . meloxicam (MOBIC) 15 MG tablet    Sig: Take 1 tablet (15 mg total) by mouth daily.    Dispense:  30 tablet    Refill:  0  . cyclobenzaprine (FLEXERIL) 10 MG tablet    Sig: Take 1 tablet (10 mg total) by mouth at bedtime.    Dispense:  30 tablet    Refill:  0   Reevaluate symptoms in one week if not improved with medication and range of motion exercises

## 2012-02-08 NOTE — Addendum Note (Signed)
Addended by: Tonye Pearson on: 02/08/2012 12:27 PM   Modules accepted: Level of Service

## 2012-02-08 NOTE — Patient Instructions (Addendum)
Your EKG is normal and there is no indication of heart problems your neurological exam is intact and there is no indication of a stroke These intermittent symptoms suggests I nerve pressure and the most likely origin would be your neck with a bony problem like arthritis We have done 2 other tests to consider metabolic causes and we'll call you with those results in the next 2 days for mail you a letter if everything is normal If the medications prescribed do not work quickly, then the next step would be a neurological evaluation And you may arrange this by going back to the internal medicine clinic at [facility] to see your regular doctor

## 2012-02-11 ENCOUNTER — Encounter: Payer: Self-pay | Admitting: Internal Medicine

## 2012-04-20 ENCOUNTER — Emergency Department (HOSPITAL_COMMUNITY)
Admission: EM | Admit: 2012-04-20 | Discharge: 2012-04-20 | Disposition: A | Discharge status: Nursing Home (Includes ICF) | Payer: Commercial Managed Care - PPO | Source: Home / Self Care | Attending: Family Medicine | Admitting: Family Medicine

## 2012-04-20 ENCOUNTER — Encounter (HOSPITAL_COMMUNITY): Payer: Self-pay | Admitting: *Deleted

## 2012-04-20 DIAGNOSIS — K219 Gastro-esophageal reflux disease without esophagitis: Secondary | ICD-10-CM | POA: Insufficient documentation

## 2012-04-20 DIAGNOSIS — E669 Obesity, unspecified: Secondary | ICD-10-CM | POA: Insufficient documentation

## 2012-04-20 DIAGNOSIS — L03119 Cellulitis of unspecified part of limb: Secondary | ICD-10-CM | POA: Insufficient documentation

## 2012-04-20 DIAGNOSIS — E785 Hyperlipidemia, unspecified: Secondary | ICD-10-CM | POA: Insufficient documentation

## 2012-04-20 DIAGNOSIS — R600 Localized edema: Secondary | ICD-10-CM

## 2012-04-20 DIAGNOSIS — L02419 Cutaneous abscess of limb, unspecified: Secondary | ICD-10-CM | POA: Insufficient documentation

## 2012-04-20 DIAGNOSIS — R609 Edema, unspecified: Secondary | ICD-10-CM

## 2012-04-20 DIAGNOSIS — G4730 Sleep apnea, unspecified: Secondary | ICD-10-CM | POA: Insufficient documentation

## 2012-04-20 LAB — URINALYSIS, ROUTINE W REFLEX MICROSCOPIC
Hgb urine dipstick: NEGATIVE
Leukocytes, UA: NEGATIVE
Protein, ur: 30 mg/dL — AB
Specific Gravity, Urine: 1.036 — ABNORMAL HIGH (ref 1.005–1.030)
pH: 6 (ref 5.0–8.0)

## 2012-04-20 LAB — COMPREHENSIVE METABOLIC PANEL
AST: 27 U/L (ref 0–37)
BUN: 12 mg/dL (ref 6–23)
Chloride: 101 mEq/L (ref 96–112)
Creatinine, Ser: 0.76 mg/dL (ref 0.50–1.35)
GFR calc Af Amer: >90 mL/min (ref >90)
Glucose, Bld: 214 mg/dL — ABNORMAL HIGH (ref 70–99)
Potassium: 3.4 mEq/L — ABNORMAL LOW (ref 3.5–5.1)
Sodium: 137 mEq/L (ref 135–145)

## 2012-04-20 LAB — CBC WITH DIFFERENTIAL/PLATELET
Eosinophils Absolute: 0.2 10*3/uL (ref 0.0–0.7)
Eosinophils Relative: 2 % (ref 0–5)
MCHC: 33.3 g/dL (ref 30.0–36.0)
Neutrophils Relative %: 60 % (ref 43–77)

## 2012-04-20 NOTE — ED Notes (Signed)
Pt  Reports  About  1  Week  Of  Swelling  Of  both his  L;ower  Legs    -  He  denys  Any  Chest pain  He  denys  Any     Shortness of  Breath         He  Ambulated to  The  Exam  Room  With a  Slow  Steady  Gait   -  He  Is  Speaking in  Complete  Slow  sentances      He  Has    A  blister to his  r  Lower  Shin area  denys  Any  specefic  Burn or  Event    He  Has  Some  Edema of the  extremitys  present
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
## 2012-04-20 NOTE — ED Notes (Signed)
The pt has had bi-lateral lower extremity swelling and redness for several days.  Blister to the rt lower leg for 3 days

## 2012-04-21 ENCOUNTER — Emergency Department (HOSPITAL_COMMUNITY)
Admission: EM | Admit: 2012-04-21 | Discharge: 2012-04-21 | Disposition: A | Discharge status: Home / Self Care | Payer: Commercial Managed Care - PPO | Attending: Emergency Medicine | Admitting: Emergency Medicine

## 2012-04-21 DIAGNOSIS — L03115 Cellulitis of right lower limb: Secondary | ICD-10-CM

## 2012-04-21 LAB — PRO B NATRIURETIC PEPTIDE: Pro B Natriuretic peptide (BNP): 12.3 pg/mL (ref 0–125)

## 2012-04-21 MED ORDER — CEPHALEXIN 500 MG PO CAPS: 500.0000 mg | ORAL_CAPSULE | Freq: Four times a day (QID) | ORAL | Status: AC

## 2012-04-21 MED ORDER — SULFAMETHOXAZOLE-TRIMETHOPRIM 800-160 MG PO TABS: 1.0000 | ORAL_TABLET | Freq: Two times a day (BID) | ORAL | Status: AC

## 2012-04-21 MED ORDER — CEFTRIAXONE SODIUM 1 G IJ SOLR
1.0000 g | Freq: Once | INTRAMUSCULAR | Status: AC
  Administered 2012-04-21: 1 g via INTRAMUSCULAR
  Filled 2012-04-21: qty 10

## 2012-04-21 MED ORDER — LIDOCAINE HCL (PF) 1 % IJ SOLN
INTRAMUSCULAR | Status: AC
  Administered 2012-04-21: 2 mL
  Filled 2012-04-21: qty 5

## 2012-04-21 MED ORDER — NAPROXEN 500 MG PO TABS: 500.0000 mg | ORAL_TABLET | Freq: Two times a day (BID) | ORAL | Status: DC

## 2012-04-21 MED ORDER — SULFAMETHOXAZOLE-TMP DS 800-160 MG PO TABS
1.0000 | ORAL_TABLET | Freq: Once | ORAL | Status: AC
  Administered 2012-04-21: 1 via ORAL
  Filled 2012-04-21: qty 1
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
## 2012-04-21 NOTE — ED Notes (Signed)
Pt. Sent here for eval of bilateral leg swelling. Pt. Legs swollen, blister on right lower extremity, with redness surrounding.

## 2012-05-08 ENCOUNTER — Encounter: Payer: Self-pay | Admitting: Internal Medicine

## 2012-05-08 ENCOUNTER — Ambulatory Visit (INDEPENDENT_AMBULATORY_CARE_PROVIDER_SITE_OTHER): Payer: Commercial Managed Care - PPO | Admitting: Internal Medicine

## 2012-05-08 ENCOUNTER — Encounter: Payer: Commercial Managed Care - PPO | Admitting: Internal Medicine

## 2012-05-08 ENCOUNTER — Other Ambulatory Visit: Payer: Self-pay | Admitting: Internal Medicine

## 2012-05-08 VITALS — BP 131/84 | HR 75 | Temp 98.3°F | Ht 74.0 in | Wt 379.1 lb

## 2012-05-08 DIAGNOSIS — R600 Localized edema: Secondary | ICD-10-CM

## 2012-05-08 DIAGNOSIS — I10 Essential (primary) hypertension: Secondary | ICD-10-CM

## 2012-05-08 DIAGNOSIS — R609 Edema, unspecified: Secondary | ICD-10-CM

## 2012-05-08 DIAGNOSIS — G4733 Obstructive sleep apnea (adult) (pediatric): Secondary | ICD-10-CM

## 2012-05-08 DIAGNOSIS — E876 Hypokalemia: Secondary | ICD-10-CM

## 2012-05-08 DIAGNOSIS — K219 Gastro-esophageal reflux disease without esophagitis: Secondary | ICD-10-CM

## 2012-05-08 DIAGNOSIS — E785 Hyperlipidemia, unspecified: Secondary | ICD-10-CM

## 2012-05-08 MED ORDER — POTASSIUM CHLORIDE ER 10 MEQ PO TBCR: 10.0000 meq | EXTENDED_RELEASE_TABLET | Freq: Every day | ORAL | Status: DC

## 2012-05-08 MED ORDER — FUROSEMIDE 40 MG PO TABS: 40.0000 mg | ORAL_TABLET | Freq: Every day | ORAL | Status: DC

## 2012-05-08 NOTE — Patient Instructions (Addendum)
Call clinic Monday to schedule follow up appointment First week October  Please go get your echo and b/l venous doppler before your visit with me  Take the Lasix daily 40mg  daily and potassium pills daily until you come see [place] next week  Stop taking Metoprolol until your follow up visit  If you get dizzy or light-headed call or come to clinic sooner  If swelling gets worse, call or come to clinic or go to ED

## 2012-05-09 LAB — COMPREHENSIVE METABOLIC PANEL
ALT: 67 U/L — ABNORMAL HIGH (ref 0–53)
AST: 34 U/L (ref 0–37)
CO2: 26 mEq/L (ref 19–32)
Calcium: 9.2 mg/dL (ref 8.4–10.5)
Chloride: 105 mEq/L (ref 96–112)
Sodium: 139 mEq/L (ref 135–145)
Total Protein: 7 g/dL (ref 6.0–8.3)

## 2012-05-09 LAB — CBC
Platelets: 257 10*3/uL (ref 150–400)
RBC: 5.65 MIL/uL (ref 4.22–5.81)
RDW: 14 % (ref 11.5–15.5)
WBC: 10.3 10*3/uL (ref 4.0–10.5)

## 2012-05-09 LAB — TSH: TSH: 1.71 u[IU]/mL (ref 0.350–4.500)

## 2012-05-10 DIAGNOSIS — R600 Localized edema: Secondary | ICD-10-CM | POA: Insufficient documentation

## 2012-05-10 DIAGNOSIS — E876 Hypokalemia: Secondary | ICD-10-CM | POA: Insufficient documentation

## 2012-05-10 NOTE — Progress Notes (Signed)
I saw, examined, and discussed the patient with Dr Virgina Organ and agree with the note contained here. Increase LE edema and DOE. He has had a recent UA with only 30 protein so doubt nephrotic syndrome as cause of volume overload. LFT's nl except new slight increase in ALT. TSH recently nl. Recent EKG did not meet criteria for chamber enlargement but 2010 CXR suggested vol overload in pattern suggesting Pul HTN. Needs ECHO. OK to check LE dopplers to R/O DVT since R leg edema worse than left. Recent pro-BNP normal but will try trial of low dose lasix since if this is r heart failure he will be pre-load dependent. Close F/U needed.

## 2012-05-10 NOTE — Assessment & Plan Note (Signed)
On zocor, advised on weight loss and modifying diet. LDL 95 7 months prior.   -continue zocor

## 2012-05-10 NOTE — Assessment & Plan Note (Addendum)
Uses cpap  F/u echo
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
## 2012-05-10 NOTE — Assessment & Plan Note (Signed)
Potassium noted to be 3.4 on 04/20/12, will recheck today  -f/u K -Kdur daily until f/u visit next visit-recheck on f/u -continue to monitor

## 2012-05-10 NOTE — Assessment & Plan Note (Addendum)
x2 months, right worse than left. Tight skin lower extremities. Doppler obtained distal pulses. Dried up lesions x2 on RLE. Able to walk. Concerning for possible vascular insufficiency vs cellulitis vs. chf?.  Seen in ED 04/21/12, left same day, apparently completed 10 day course of Keflex since ED visit.   -f/u lower extremity doppler -lasix 40mg  daily until follow up visit early next week--if worsening, advised to go to ED or call or come to clinic asap -f/u echo and labs

## 2012-05-10 NOTE — Assessment & Plan Note (Signed)
Continue protonix  

## 2012-05-10 NOTE — Assessment & Plan Note (Signed)
Starting lasix 40mg  daily until follow up given lower extremity edema, will hold metoprolol until follow up  -continue to monitor

## 2012-05-11 NOTE — Addendum Note (Signed)
Addended by: Army Fossa on: 05/11/2012 12:19 PM   Modules accepted: Orders

## 2012-05-11 NOTE — Addendum Note (Signed)
Addended by: Bufford Spikes on: 05/11/2012 02:40 PM   Modules accepted: Orders

## 2012-05-12 ENCOUNTER — Other Ambulatory Visit: Payer: Self-pay | Admitting: Internal Medicine

## 2012-05-12 DIAGNOSIS — R748 Abnormal levels of other serum enzymes: Secondary | ICD-10-CM

## 2012-05-19 ENCOUNTER — Ambulatory Visit (HOSPITAL_COMMUNITY)
Admission: RE | Admit: 2012-05-19 | Discharge: 2012-05-19 | Disposition: A | Discharge status: Home / Self Care | Payer: Commercial Managed Care - PPO | Source: Ambulatory Visit | Attending: Internal Medicine | Admitting: Internal Medicine

## 2012-05-19 ENCOUNTER — Encounter (HOSPITAL_COMMUNITY): Payer: Self-pay | Admitting: *Deleted

## 2012-05-19 ENCOUNTER — Telehealth: Payer: Self-pay | Admitting: *Deleted

## 2012-05-19 DIAGNOSIS — R600 Localized edema: Secondary | ICD-10-CM

## 2012-05-19 DIAGNOSIS — I10 Essential (primary) hypertension: Secondary | ICD-10-CM

## 2012-05-19 DIAGNOSIS — M79609 Pain in unspecified limb: Secondary | ICD-10-CM

## 2012-05-19 DIAGNOSIS — R0989 Other specified symptoms and signs involving the circulatory and respiratory systems: Secondary | ICD-10-CM | POA: Insufficient documentation

## 2012-05-19 DIAGNOSIS — R0609 Other forms of dyspnea: Secondary | ICD-10-CM | POA: Insufficient documentation

## 2012-05-19 DIAGNOSIS — M7989 Other specified soft tissue disorders: Secondary | ICD-10-CM

## 2012-05-19 NOTE — Progress Notes (Signed)
  Echocardiogram 2D Echocardiogram has been performed.  Brian Reilly 05/19/2012, 3:52 PM

## 2012-05-19 NOTE — Progress Notes (Signed)
*  Preliminary Results* Bilateral lower extremity venous duplex completed. Technically limited study due to patient body habitus and depth of vessels. There is no obvious evidence of deep vein thrombosis bilaterally, however most of bilateral femoral veins were not visualized, therefore deep vein thrombosis cannot be excluded in these vessels.  05/19/2012 5:24 PM Gertie Fey, RDMS, RDCS

## 2012-05-19 NOTE — Telephone Encounter (Signed)
Call from [place] from Vascular lab.  Pt's Doppler was limited.  No obvious evidence of DVT bilaterally.  No evidence of Baker Cyst.  Dr. Rogelia Boga was called and report given.  RTC to [place] pt was allowed to leave given to follow up as planned.  Angelina Ok, RN 05/19/2012 4:10 PM.

## 2012-05-20 ENCOUNTER — Ambulatory Visit (INDEPENDENT_AMBULATORY_CARE_PROVIDER_SITE_OTHER): Payer: Commercial Managed Care - PPO | Admitting: Internal Medicine

## 2012-05-20 ENCOUNTER — Encounter: Payer: Self-pay | Admitting: Internal Medicine

## 2012-05-20 VITALS — BP 152/98 | HR 92 | Temp 98.5°F | Ht 74.0 in | Wt 370.4 lb

## 2012-05-20 DIAGNOSIS — I10 Essential (primary) hypertension: Secondary | ICD-10-CM

## 2012-05-20 DIAGNOSIS — R748 Abnormal levels of other serum enzymes: Secondary | ICD-10-CM

## 2012-05-20 DIAGNOSIS — R609 Edema, unspecified: Secondary | ICD-10-CM

## 2012-05-20 DIAGNOSIS — Z79899 Other long term (current) drug therapy: Secondary | ICD-10-CM

## 2012-05-20 DIAGNOSIS — R600 Localized edema: Secondary | ICD-10-CM

## 2012-05-20 LAB — BASIC METABOLIC PANEL
BUN: 10 mg/dL (ref 6–23)
CO2: 28 mEq/L (ref 19–32)
Calcium: 9.4 mg/dL (ref 8.4–10.5)
Chloride: 103 mEq/L (ref 96–112)
Creat: 0.87 mg/dL (ref 0.50–1.35)
Glucose, Bld: 183 mg/dL — ABNORMAL HIGH (ref 70–99)
Potassium: 4.2 mEq/L (ref 3.5–5.3)
Sodium: 139 mEq/L (ref 135–145)

## 2012-05-20 LAB — HEPATIC FUNCTION PANEL
ALT: 67 U/L — ABNORMAL HIGH (ref 0–53)
AST: 42 U/L — ABNORMAL HIGH (ref 0–37)
Albumin: 4.1 g/dL (ref 3.5–5.2)
Alkaline Phosphatase: 132 U/L — ABNORMAL HIGH (ref 39–117)
Bilirubin, Direct: 0.1 mg/dL (ref 0.0–0.3)
Indirect Bilirubin: 0.4 mg/dL (ref 0.0–0.9)
Total Bilirubin: 0.5 mg/dL (ref 0.3–1.2)
Total Protein: 7.4 g/dL (ref 6.0–8.3)

## 2012-05-20 MED ORDER — METOPROLOL TARTRATE 25 MG PO TABS: 25.0000 mg | ORAL_TABLET | Freq: Two times a day (BID) | ORAL | Status: DC

## 2012-05-20 MED ORDER — FUROSEMIDE 40 MG PO TABS: 40.0000 mg | ORAL_TABLET | Freq: Every day | ORAL | Status: DC

## 2012-05-20 NOTE — Progress Notes (Signed)
  Subjective:    Patient ID: Brian Reilly, male    DOB: 1961-02-11, 51 y.o.   MRN: [id number]  HPI Pt has hypertension, OSA, hyperlipidemia and was recently evaluated in clinic for LE edema and dyspnea.  Had LE dopplers to r/o DVT and 2D ECHO to assess for CHF and presents today for results.   Review of Systems  Constitutional: Negative for fatigue.  HENT: Negative for congestion.   Respiratory: Negative for shortness of breath.   Cardiovascular: Negative for chest pain and leg swelling.       LE edema improved  Musculoskeletal: Negative for arthralgias.  Neurological: Negative for dizziness and weakness.       Objective:   Physical Exam  Constitutional: He is oriented to person, place, and time. He appears well-developed and well-nourished.       Obese, predominately Spanish speaking but understands English to a large extent  HENT:  Head: Normocephalic and atraumatic.  Eyes: Conjunctivae normal and EOM are normal. Pupils are equal, round, and reactive to light.  Cardiovascular: Normal rate, regular rhythm and normal heart sounds.   Pulmonary/Chest: Effort normal and breath sounds normal.  Abdominal: Soft. Bowel sounds are normal.       obese  Musculoskeletal: Normal range of motion. He exhibits no edema and no tenderness.  Neurological: He is alert and oriented to person, place, and time.  Skin: Skin is warm and dry.  Psychiatric: He has a normal mood and affect.          Assessment & Plan:  1. Hypertension: resume metoprolol, unclear why this was held initially 2. LE edema: resolved with Lasix -cont Lasix 3. Dispo: f/u with PCP

## 2012-05-20 NOTE — Assessment & Plan Note (Signed)
Above goal today 152/98 pulse 92 bpm, not on antihypertensives today, advised to hold metoprolol at prior visit, ECHO unremarkable -resume metoprolol 25 mg qd -cont Lasix 40 mg qd

## 2012-05-20 NOTE — Assessment & Plan Note (Signed)
Negative LE Dopplers, only trace LE edema today on exam -cont Lasix 40 mg -check kidney function

## 2012-05-20 NOTE — Patient Instructions (Signed)
The ultrasound of your legs and heart did not show problems. Continue the Lasix medication. Restart the metoprolol. Follow-up with PCP in 3 months.

## 2012-05-26 ENCOUNTER — Ambulatory Visit (HOSPITAL_COMMUNITY): Payer: Commercial Managed Care - PPO

## 2012-06-15 NOTE — Addendum Note (Signed)
Addended by: Bufford Spikes on: 06/15/2012 04:19 PM   Modules accepted: Orders

## 2012-07-28 ENCOUNTER — Encounter: Payer: Self-pay | Admitting: Internal Medicine

## 2012-08-31 ENCOUNTER — Ambulatory Visit (INDEPENDENT_AMBULATORY_CARE_PROVIDER_SITE_OTHER): Payer: Commercial Managed Care - PPO | Admitting: Physician Assistant

## 2012-08-31 VITALS — BP 164/105 | HR 88 | Temp 98.7°F | Resp 18 | Wt 335.0 lb

## 2012-08-31 DIAGNOSIS — N4889 Other specified disorders of penis: Secondary | ICD-10-CM

## 2012-08-31 DIAGNOSIS — Z113 Encounter for screening for infections with a predominantly sexual mode of transmission: Secondary | ICD-10-CM

## 2012-08-31 DIAGNOSIS — N489 Disorder of penis, unspecified: Secondary | ICD-10-CM

## 2012-08-31 DIAGNOSIS — B009 Herpesviral infection, unspecified: Secondary | ICD-10-CM

## 2012-08-31 DIAGNOSIS — I10 Essential (primary) hypertension: Secondary | ICD-10-CM

## 2012-08-31 MED ORDER — VALACYCLOVIR HCL 1 G PO TABS: 1000.0000 mg | ORAL_TABLET | Freq: Two times a day (BID) | ORAL | Status: DC

## 2012-08-31 NOTE — Patient Instructions (Addendum)
Begin taking the valacyclovir today.  Be sure to take all of the medication.  I will let you know when your labs are back.  If you are not better after you finish this medication, come back in.  Make sure that any sexual partners are aware that they may have been exposed.     Herpes genital (Genital Herpes) El herpes genital es una enfermedad de transmisin sexual. Esto significa que se transmite al Pharmacologist relaciones sexuales con una persona infectada. No hay cura para el herpes genital. El tiempo que transcurre entre un episodio y Therapist, art puede ser desde algunos meses [employer]. El virus puede vivir en una persona, pero no ocasionar sntomas (problemas). Esta infeccin puede pasarse a un beb cuando desciende por el canal del parto (vagina). En un recin nacido puede causar daos en el sistema nervioso central y en los ojos, e incluso la [place]. Generalmente el virus que causa el herpes genital es el virus VHS 2. El virus que ocasiona el herpes bucal casi siempre es el VHS 1. El diagnstico (determinar cul es el problema) puede realizarse por medio de un cultivo. SNTOMAS Generalmente los sntomas de dolor y picazn aparecen [address] a una semana despus del contacto. Comienza como diminutas ampollas que progresan a pequeas lceras dolorosas que luego forman [place] y se curan despus de [employer]. Afecta la zona genital externa, la vagina, el cuello del tero, el pene, la zona anal, las nalgas y los muslos. INSTRUCCIONES PARA EL CUIDADO DOMICILIARIO  Mantenga la regin de las lceras seca y limpia.  Tome los medicamentos como se le indic. Los medicamentos antivirales pueden acelerar la curacin. Ellos no evitarn las recurrencias ni curarn esta infeccin. Estos medicamentos tambin pueden tomarse para la supresin si se producen recurrencias frecuentes.  Mientras la infeccin est activa, es contagiosa. Evite todos los contactos sexuales durante las infecciones en [place].  Los  preservativos pueden ser tiles para prevenir la diseminacin del virus del herpes.  Practique el sexo seguro.  Lvese las manos cuidadosamente despus de tocar la zona genital.  Evite tocarse los ojos despus de tocarse la zona genital.  Informe al profesional que lo asiste si tiene herpes genital y [place]. Es su responsabilidad asegurar que en este embarazo el beb llegue a un buen trmino.  Utilice los medicamentos de venta libre o de prescripcin para Chief Technology Officer, Environmental health practitioner o la [place], segn se lo indique el profesional que lo asiste. SOLICITE ATENCIN MDICA SI:  Tiene recurrencia de la infeccin.  No responde o no obtiene [employer].  Aparece un nuevo dolor o secreciones que sean diferentes de la infeccin original.  Usted tiene una temperatura oral de ms de 102 F (38.9 C).  Presenta dolor abdominal.  Comienza a sentir dolor en los ojos o signos de infeccin ocular. Document Released: 05/08/2005 Document Revised: 10/21/2011 [facility] Patient Information 2013 [place], [place].

## 2012-08-31 NOTE — Progress Notes (Signed)
Subjective:    Patient ID: Brian Reilly, male    DOB: [birth year], 52 y.o.   MRN: [id number]  HPI   Brian Reilly is a 52 yr old Spanish speaking male.  His English is limited, and it is difficult to obtain the history.  He comes today with several concerns.  (1)  Has concern for infection in his right leg (?)  He states that he was treated for an infection of his leg several months ago.  Occasionally has swelling in his legs for which he takes lasix.  Denies pain, redness, warmth but states he feels like there is an infection in the right leg.  Left leg is ok.  States he had a fever of 100.70F about two weeks ago, but has not had fever or chills since.  Able to bear weight.     (2)  Also has concern for an infection of his penis.  States he noted some bumps about two weeks ago.  They are painful.  He has never had this before.  Denies discharge.  Denies urinary symptoms, except for frequency associated with Lasix.  States there are "no problems inside".  It is difficult to obtain a sexual history due to the language barrier, but he states he is only sexually active with his wife. States that he hasn't had sex in [place], but then states that he is currently sexually active.  (3)  Reported to triager that he had a sore throat.  States this happens occasionally, but that his throat is not bothering him today.  (4)  Pt is hypertensive today, states he takes his medications faithfully.  Denies HA, blurred vision, CP, DOE.  Review of Systems  Constitutional: Positive for fever (100.70F, two weeks ago).  HENT: Positive for sore throat (occasionally). Negative for ear pain, congestion, rhinorrhea, sneezing and sinus pressure.   Respiratory: Negative.   Cardiovascular: Negative.   Gastrointestinal: Negative.   Genitourinary: Positive for frequency, genital sores and penile pain. Negative for dysuria, hematuria, penile swelling, scrotal swelling, difficulty urinating and testicular pain.  Musculoskeletal:  Negative.   Skin: Negative.   Neurological: Negative.        Objective:   Physical Exam  Vitals reviewed. Constitutional: He is oriented to person, place, and time. He appears well-developed and well-nourished. No distress.  HENT:  Head: Normocephalic and atraumatic.  Mouth/Throat: Uvula is midline, oropharynx is clear and moist and mucous membranes are normal.  Eyes: Conjunctivae normal are normal. No scleral icterus.  Neck: Neck supple.  Cardiovascular: Normal rate, regular rhythm and normal heart sounds.  Exam reveals no gallop and no friction rub.   No murmur heard. Pulses:      Radial pulses are 2+ on the right side, and 2+ on the left side.       Dorsalis pedis pulses are 2+ on the right side, and 2+ on the left side.       Posterior tibial pulses are 2+ on the right side, and 2+ on the left side.  Pulmonary/Chest: Effort normal and breath sounds normal. He has no wheezes. He has no rales.  Genitourinary: Testes normal.    Penile tenderness present. No discharge found.       Several tender erythematous lesions at the superior coronal ridge and at the frenulum; no vesicles; no chancre; no discharge from the meatus  Musculoskeletal:       Right lower leg: Normal.       Left lower leg: Normal.  Negative Homan sign  Lymphadenopathy:    He has no cervical adenopathy.  Neurological: He is alert and oriented to person, place, and time. He has normal strength. No sensory deficit.  Reflex Scores:      Patellar reflexes are 2+ on the right side and 2+ on the left side.      Achilles reflexes are 2+ on the right side and 2+ on the left side. Skin: Skin is warm and dry.  Psychiatric: He has a normal mood and affect. His behavior is normal.     Filed Vitals:   08/31/12 1436  BP: 164/105  Pulse: 88  Temp: 98.7 F (37.1 C)  Resp: 18        Assessment & Plan:   1. Penile lesion  RPR, Herpes simplex virus culture  2. Screen for sexually transmitted diseases   GC/chlamydia probe amp, urine, HIV antibody, RPR, Herpes simplex virus culture  3. Herpetic lesion  valACYclovir (VALTREX) 1000 MG tablet  4. Hypertension      Brian Reilly is a pleasant 52 yr old male here with several concerns.    The right leg appears normal - ROM, strength, sensation, and DTRs all normal.  Negative Homan's.  There is no evidence of cellulitis.  Reassured pt that I do think he has infection.  Encouraged him to continue using the Lasix as directed for LE edema.    On GU exam, pt with several erythematous lesions on the glans of the penis.  They are not vesicular at this time, but I wonder if they were vesicular at the onset and have now been unroofed and ulcerated.  Viral culture taken.  Discussed with pt that I feel this could be an HSV infection.  He then agreed to further STD testing.  HIV, uriprobe, and RPR also sent.  Encouraged him to share this with any sexual partners, so that they can be tested.  Will treat HSV presumptively with Valtrex 1000mg  BID x 10 days.  Will follow up on labs.  Instructed pt to RTC if not improving with Valtrex.  Pt hypertensive today at 164/105.  Asymptomatic.  Encouraged pt to follow-up with PCP in the next 2-4 weeks to discuss HTN.

## 2012-09-01 LAB — GC/CHLAMYDIA PROBE AMP, URINE
Chlamydia, Swab/Urine, PCR: NEGATIVE
GC Probe Amp, Urine: NEGATIVE

## 2012-09-02 LAB — HERPES SIMPLEX VIRUS CULTURE: Organism ID, Bacteria: NOT DETECTED

## 2012-10-01 ENCOUNTER — Ambulatory Visit (INDEPENDENT_AMBULATORY_CARE_PROVIDER_SITE_OTHER): Payer: Commercial Managed Care - PPO | Admitting: Internal Medicine

## 2012-10-01 ENCOUNTER — Encounter: Payer: Self-pay | Admitting: Internal Medicine

## 2012-10-01 VITALS — BP 147/98 | HR 75 | Temp 98.2°F | Ht 75.0 in | Wt 329.0 lb

## 2012-10-01 DIAGNOSIS — IMO0002 Reserved for concepts with insufficient information to code with codable children: Secondary | ICD-10-CM

## 2012-10-01 DIAGNOSIS — B009 Herpesviral infection, unspecified: Secondary | ICD-10-CM

## 2012-10-01 DIAGNOSIS — I10 Essential (primary) hypertension: Secondary | ICD-10-CM

## 2012-10-01 DIAGNOSIS — N476 Balanoposthitis: Secondary | ICD-10-CM

## 2012-10-01 DIAGNOSIS — E118 Type 2 diabetes mellitus with unspecified complications: Secondary | ICD-10-CM

## 2012-10-01 DIAGNOSIS — K219 Gastro-esophageal reflux disease without esophagitis: Secondary | ICD-10-CM

## 2012-10-01 DIAGNOSIS — R7302 Impaired glucose tolerance (oral): Secondary | ICD-10-CM

## 2012-10-01 DIAGNOSIS — N481 Balanitis: Secondary | ICD-10-CM

## 2012-10-01 DIAGNOSIS — E785 Hyperlipidemia, unspecified: Secondary | ICD-10-CM

## 2012-10-01 LAB — CBC WITH DIFFERENTIAL/PLATELET
Basophils Absolute: 0 10*3/uL (ref 0.0–0.1)
Basophils Relative: 0 % (ref 0–1)
HCT: 49.2 % (ref 39.0–52.0)
Lymphocytes Relative: 28 % (ref 12–46)
MCHC: 33.1 g/dL (ref 30.0–36.0)
Monocytes Absolute: 0.7 10*3/uL (ref 0.1–1.0)
Neutro Abs: 5.5 10*3/uL (ref 1.7–7.7)
Neutrophils Relative %: 63 % (ref 43–77)
Platelets: 224 10*3/uL (ref 150–400)
RDW: 13.5 % (ref 11.5–15.5)
WBC: 8.9 10*3/uL (ref 4.0–10.5)

## 2012-10-01 LAB — COMPLETE METABOLIC PANEL WITH GFR
Albumin: 4 g/dL (ref 3.5–5.2)
Alkaline Phosphatase: 131 U/L — ABNORMAL HIGH (ref 39–117)
BUN: 8 mg/dL (ref 6–23)
CO2: 27 mEq/L (ref 19–32)
Calcium: 9.5 mg/dL (ref 8.4–10.5)
Chloride: 103 mEq/L (ref 96–112)
GFR, Est African American: >89 mL/min
GFR, Est Non African American: >89 mL/min
Glucose, Bld: 268 mg/dL — ABNORMAL HIGH (ref 70–99)
Potassium: 4.4 mEq/L (ref 3.5–5.3)
Sodium: 140 mEq/L (ref 135–145)
Total Protein: 6.8 g/dL (ref 6.0–8.3)

## 2012-10-01 LAB — LIPID PANEL
Cholesterol: 195 mg/dL (ref 0–200)
LDL Cholesterol: 134 mg/dL — ABNORMAL HIGH (ref 0–99)
VLDL: 20 mg/dL (ref 0–40)

## 2012-10-01 MED ORDER — HYDROCHLOROTHIAZIDE 12.5 MG PO CAPS: 12.5000 mg | ORAL_CAPSULE | Freq: Every day | ORAL | Status: DC

## 2012-10-01 MED ORDER — CLOTRIMAZOLE 1 % EX CREA: TOPICAL_CREAM | Freq: Two times a day (BID) | CUTANEOUS | Status: DC

## 2012-10-01 MED ORDER — VALACYCLOVIR HCL 1 G PO TABS: 1000.0000 mg | ORAL_TABLET | Freq: Two times a day (BID) | ORAL | Status: DC

## 2012-10-01 MED ORDER — PANTOPRAZOLE SODIUM 40 MG PO TBEC: 40.0000 mg | DELAYED_RELEASE_TABLET | Freq: Every day | ORAL | Status: DC

## 2012-10-01 MED ORDER — SIMVASTATIN 40 MG PO TABS: 40.0000 mg | ORAL_TABLET | Freq: Every evening | ORAL | Status: DC

## 2012-10-01 MED ORDER — METOPROLOL TARTRATE 25 MG PO TABS: 25.0000 mg | ORAL_TABLET | Freq: Two times a day (BID) | ORAL | Status: DC

## 2012-10-01 NOTE — Progress Notes (Signed)
  Subjective:    Patient ID: Brian Reilly, male    DOB: Oct 30, 1960, 52 y.o.   MRN: [id number]  HPI Today is the first time I establish a patient-physician relationship with Brian Reilly.   States he went to urgent care for lesions on his penis.  He states they started about a month ago.  He states they were painful.  He states they were vesicular in nature, then flattened out into a red base.  He also states he had diffuse erythema of the head of the penis and some maceration of his foreskin. He states he took Valtrex and it improved slightly.  He states he is married, but his wife recently arrived from [place].  He remembers having sexual relations with her once, then these lesions occurred.   Denies CP, SOB.  Denies melena, hematochezia. Denies tobacco use. Denies alcohol use currently, but quit 20 years ago (was a heavy drinker).  Review of Systems     Objective:   Physical Exam Filed Vitals:   10/01/12 0910  BP: 147/98  Pulse: 75  Temp:    GEN: AAOx3, NAD. HEENT: EOMI, PERRLA, no icterus. CV: S1S2, no m/r/g, RRR PULM: CTA bilat. ABD/GI: Soft, NT, obese, +BS, no guarding. LE/UE: 2/4 pulses, no c/c/e. NEURO: CN II-XII intact, no focal deficits. GU: Scattered erythema, some scattered macerated skin on foreskin.       Assessment & Plan:  52 yr. Old male w/ pmhx significant for HL, morbid besity Body mass index is 41.12 kg/(m^2)., HTN, presents for follow up. 1) HSV infection vs. Balanitis: repeat course Valtrex 1000 mg po q12hrs x 7 days.  I am also concerned about candidal infection. Clotrimazole 1% solution applied twice daily for 10 days.   2) Possible DM: Check A1C. Check BMP. 3) HTN: Add HCTZ 12.5 mg PO qdaily. 4) HL: LFT's, Lipid panel today. Continue statin. 5) Health Maintenance: States he's had colonoscopy, I need records.  Does not want flu vaccine.  Return in 1 month.  [name]  [facility] 10/02/12 Reviewed laboratory studies from yesterday.  He has  type 2 DM with a HbA1c of 11.7%.  His penile infection is likely a candidal balanitis due to his uncontrolled DM. I discussed therapy for this with him. He is willing to start oral therapy with metformin, but I advised him he will likely need insulin until we control him better.  He is willing to speak with our diabetes educator next Monday at around 4:30 pm, I will send her a message to see if we can arrange this. He will need to see me sooner, preferably in two weeks to further discuss therapy changes including cholesterol and hypertension control.  [name]

## 2012-10-01 NOTE — Patient Instructions (Addendum)
Take clotrimazole cream twice daily for 10 days. Return in 1 month. Start HCTZ for blood pressure. Take another course of Valtrex twice daily for 10 days. Blood work today.

## 2012-10-02 DIAGNOSIS — E1165 Type 2 diabetes mellitus with hyperglycemia: Secondary | ICD-10-CM | POA: Insufficient documentation

## 2012-10-02 LAB — URINALYSIS, MICROSCOPIC ONLY: Squamous Epithelial / LPF: NONE SEEN

## 2012-10-02 LAB — URINALYSIS, ROUTINE W REFLEX MICROSCOPIC
Bilirubin Urine: NEGATIVE
Nitrite: NEGATIVE
Protein, ur: NEGATIVE mg/dL
Specific Gravity, Urine: >1.03 — ABNORMAL HIGH (ref 1.005–1.030)
Urobilinogen, UA: 1 mg/dL (ref 0.0–1.0)

## 2012-10-02 MED ORDER — METFORMIN HCL 850 MG PO TABS: 850.0000 mg | ORAL_TABLET | Freq: Two times a day (BID) | ORAL | Status: DC

## 2012-10-05 ENCOUNTER — Ambulatory Visit (INDEPENDENT_AMBULATORY_CARE_PROVIDER_SITE_OTHER): Payer: Commercial Managed Care - PPO | Admitting: Dietician

## 2012-10-05 DIAGNOSIS — E118 Type 2 diabetes mellitus with unspecified complications: Secondary | ICD-10-CM

## 2012-10-05 DIAGNOSIS — E1165 Type 2 diabetes mellitus with hyperglycemia: Secondary | ICD-10-CM

## 2012-10-06 ENCOUNTER — Other Ambulatory Visit: Payer: Self-pay | Admitting: Dietician

## 2012-10-06 DIAGNOSIS — E1165 Type 2 diabetes mellitus with hyperglycemia: Secondary | ICD-10-CM

## 2012-10-06 MED ORDER — GLUCOSE BLOOD VI STRP: ORAL_STRIP | Status: DC

## 2012-10-06 MED ORDER — ONETOUCH DELICA LANCETS FINE MISC: 1.0000 | Freq: Every day | Status: DC

## 2012-10-06 MED ORDER — ONETOUCH ULTRA MINI W/DEVICE KIT: PACK | Status: DC

## 2012-10-06 NOTE — Telephone Encounter (Signed)
Patient provided with meter sample yesterday. Needs strips & lancets. Front office in process of rescheduling.

## 2012-10-06 NOTE — Telephone Encounter (Signed)
Great, thank you!

## 2012-10-06 NOTE — Progress Notes (Signed)
Diabetes Self-Management Education Visit Number: First/Initial  10/06/2012 Brian Reilly, identified by name and date of birth, is a 52 y.o. male with Diabetes Type: Type 2.  Other people present during visit:      ASSESSMENT  Patient Concerns:  Glycemic Control  Lab Results: LDL Cholesterol  Date Value Range Status  10/01/2012 134* 0 - 99 mg/dL Final           Total Cholesterol/HDL Ratio:CHD Risk                            Coronary Heart Disease Risk Table                                            Men       Women              1/2 Average Risk              3.4        3.3                  Average Risk              5.0        4.4               2X Average Risk              9.6        7.1               3X Average Risk             23.4       11.0     Use the calculated Patient Ratio above and the CHD Risk table      to determine the patient's CHD Risk.     ATP III Classification (LDL):           < 100        mg/dL         Optimal          100 - 129     mg/dL         Near or Above Optimal          130 - 159     mg/dL         Borderline High          160 - 189     mg/dL         High           > 190        mg/dL         Very High           Hemoglobin A1C  Date Value Range Status  10/01/2012 11.7* <5.7 % Final                                                                                According to the ADA Clinical Practice Recommendations  for 2011, when     HbA1c is used as a screening test:             >=6.5%   Diagnostic of Diabetes Mellitus                (if abnormal result is confirmed)           5.7-6.4%   Increased risk of developing Diabetes Mellitus           References:Diagnosis and Classification of Diabetes Mellitus,Diabetes     Care,2011,34(Suppl 1):S62-S69 and Standards of Medical Care in             Diabetes - 2011,Diabetes Care,2011,34 (Suppl 1):S11-S61.           No family history on file. History  Substance Use Topics  . Smoking status: Never  Smoker   . Smokeless tobacco: Not on file  . Alcohol Use: No    Support Systems:    need to assess at future visit Special Needs:  Other (comment)  Prior DM Education:  no Daily Foot Exams: No Patient Belief / Attitude about Diabetes:  Diabetes can be controlled  Assessment comments:  Patient had not gotten diabetes medicine and had a hard time believing he has diabetes.   Diet Recall: does not eat vegetables, mostly rice beans, plantains, yucca and meat- chicken and beef  Individualized Plan for Diabetes Self-Management Training:  Patient individualized diabetes plan discussed today with patient and includes: what is Diabetes, Nutrition, medications, monitoring, physical activity, how to handle highs and lows, Special care for my body when I have diabetes, Dealing daily with diabetes  Education Topics Reviewed with Patient Today:  Topic Points Discussed  Disease State Factors that contribute to the development of diabetes  Nutrition Management    Physical Activity and Exercise    Medications    Monitoring Taught/evaluated SMBG with ONETOUCH ULTRAMINI meter. Purpose and frequency of SMBG.;Identified appropriate SMBG and A1C goals.  Acute Complications    Chronic Complications    Psychosocial Adjustment    Goal Setting    Preconception Care (if applicable)      PATIENTS GOALS   Learning Objective(s):   (state what blood sugar range is healthy)  Goal The patient agrees to:  Nutrition Other (comment)  Physical Activity    Medications    Monitoring test my blood glucose as discussed (note x per day with comment)  Problem Solving    Reducing Risk    Health Coping     Patient Self-Evaluation of Goals (Subsequent Visits)  Goal The patient rates self as meeting goals (% of time)  Nutrition    Physical Activity    Medications    Monitoring    Problem Solving    Reducing Risk    Health Coping     PERSONALIZED PLAN / SUPPORT  Self-Management Support:    need to assess  at future visit   ______________________________________________________________________  Outcomes  Expected Outcomes:  Demonstrated interest in learning. Expect positive outcomes Self-Care Barriers:  English as a second language;Lack of material resources;Other (comment) Education material provided: yes If problems or questions, patient to contact team via:  Phone Time in: 1630     Time out: 1715 Future DSME appointment: - 4-6 wks   Plyler, Lupita Leash 10/06/2012 9:58 AM

## 2012-10-08 ENCOUNTER — Encounter: Payer: Self-pay | Admitting: Internal Medicine

## 2012-10-08 ENCOUNTER — Ambulatory Visit (INDEPENDENT_AMBULATORY_CARE_PROVIDER_SITE_OTHER): Payer: Commercial Managed Care - PPO | Admitting: Internal Medicine

## 2012-10-08 VITALS — BP 140/96 | HR 65 | Temp 98.0°F | Ht 75.0 in | Wt 328.1 lb

## 2012-10-08 DIAGNOSIS — E118 Type 2 diabetes mellitus with unspecified complications: Secondary | ICD-10-CM

## 2012-10-08 DIAGNOSIS — K219 Gastro-esophageal reflux disease without esophagitis: Secondary | ICD-10-CM

## 2012-10-08 DIAGNOSIS — E119 Type 2 diabetes mellitus without complications: Secondary | ICD-10-CM

## 2012-10-08 DIAGNOSIS — B009 Herpesviral infection, unspecified: Secondary | ICD-10-CM

## 2012-10-08 DIAGNOSIS — I10 Essential (primary) hypertension: Secondary | ICD-10-CM

## 2012-10-08 DIAGNOSIS — E785 Hyperlipidemia, unspecified: Secondary | ICD-10-CM

## 2012-10-08 DIAGNOSIS — N481 Balanitis: Secondary | ICD-10-CM

## 2012-10-08 DIAGNOSIS — Z79899 Other long term (current) drug therapy: Secondary | ICD-10-CM

## 2012-10-08 LAB — GLUCOSE, CAPILLARY: Glucose-Capillary: 186 mg/dL — ABNORMAL HIGH (ref 70–99)

## 2012-10-08 LAB — POCT GLYCOSYLATED HEMOGLOBIN (HGB A1C): Hemoglobin A1C: 10.8

## 2012-10-08 MED ORDER — SIMVASTATIN 40 MG PO TABS: 40.0000 mg | ORAL_TABLET | Freq: Every evening | ORAL | Status: DC

## 2012-10-08 MED ORDER — PANTOPRAZOLE SODIUM 40 MG PO TBEC: 40.0000 mg | DELAYED_RELEASE_TABLET | Freq: Every day | ORAL | Status: DC

## 2012-10-08 NOTE — Progress Notes (Signed)
  Subjective:    Patient ID: Brian Reilly, male    DOB: [birth year], 52 y.o.   MRN: [id number]  HPI He presents today to further discuss his diabetic therapy.  He has spoken to our diabetes educator and understands the possible need for insulin therapy in the near future. He is reluctant to start this if necessary. States he has only started taking HCTZ this morning, as he just filled it. He has also not yet started to take metformin 850 mg po bid, but I see he had it filled on 2/26.He brings me a log of his BG, I see fasting levels of 146-202.  From looking at his values, and comparing this to his A1C, his uncontrolled BG may be mostly secondary to post meal hyperglycemia. He states his penile rash is improving with previously prescribed antifungal cream.  Review of Systems Limited review of systems today otherwise negative except for that stated in the HPI.    Objective:   Physical Exam Filed Vitals:   10/08/12 1121  BP: 140/96  Pulse: 65  Temp: 98 F (36.7 C)   GEN: AAOx,3, NAD HEENT: EOMI, PERRLA, no icterus. CV: S1S2, no m/r/g, RRR PULM: CTA bilat. ABD/GI: Soft, NT, +BS, no guarding. LE/UE: 2/4 pulses, no c/c/e. No lesions. NEURO: CN II-XII intact, no focal deficits.     Assessment & Plan:  52 yr. Old male with recently diagnosed DM type 2, HTN, presents for follow up discussion of newly diagnosed DM. 1) Type 2 DM: His HgbA1C was 11.7% but this does not correlate with the log he has brought me.  Repeated HgbA1C shows similar value.  This is likely mostly elevated due to post-meal hyperglycemia.  He is reluctant to start insulin, and for now wishes to try oral therapy first. He will likely not get more than a 2% drop in A1C with just metformin monotherapy and he undertstands this. We will slowly escalate therapy and will discuss insulin therapy with him on next visit. I have instructed him to check his BG at least three times daily. He has been educated on hypoglycemia. I have asked  him to please start taking metformin at prescribed dose. He will return in 2 months. I have given him instructions in spanish both spoken and written. He has no further questions.  [name]

## 2012-10-08 NOTE — Patient Instructions (Addendum)
Metformin 850 mg dos veces al dia. Brian Reilly el aparato para chequear la azucar tres veces al dia (cuando te levantes, Brian Reilly y media despues del Corporate treasurer, y [name] y media despues de la cena) Si la azucar es menos de 70, llama a clinica y come algo para Data processing manager. Regresa [employer].

## 2012-10-28 ENCOUNTER — Other Ambulatory Visit: Payer: Self-pay | Admitting: Internal Medicine

## 2012-11-05 ENCOUNTER — Ambulatory Visit: Payer: Commercial Managed Care - PPO | Admitting: Internal Medicine

## 2012-11-16 ENCOUNTER — Encounter: Payer: Self-pay | Admitting: Internal Medicine

## 2012-11-16 DIAGNOSIS — R269 Unspecified abnormalities of gait and mobility: Secondary | ICD-10-CM | POA: Insufficient documentation

## 2012-12-08 ENCOUNTER — Ambulatory Visit: Payer: Commercial Managed Care - PPO

## 2012-12-08 ENCOUNTER — Ambulatory Visit (INDEPENDENT_AMBULATORY_CARE_PROVIDER_SITE_OTHER): Payer: Commercial Managed Care - PPO | Admitting: Family Medicine

## 2012-12-08 VITALS — BP 135/85 | HR 102 | Temp 99.2°F | Resp 20 | Ht 74.0 in | Wt 326.0 lb

## 2012-12-08 DIAGNOSIS — R109 Unspecified abdominal pain: Secondary | ICD-10-CM

## 2012-12-08 DIAGNOSIS — N12 Tubulo-interstitial nephritis, not specified as acute or chronic: Secondary | ICD-10-CM

## 2012-12-08 LAB — POCT URINALYSIS DIPSTICK
Bilirubin, UA: NEGATIVE
Glucose, UA: NEGATIVE
Nitrite, UA: NEGATIVE
Urobilinogen, UA: 0.2

## 2012-12-08 LAB — POCT CBC
Granulocyte percent: 80.3 %G — AB (ref 37–80)
HCT, POC: 48.1 % (ref 43.5–53.7)
Hemoglobin: 15.6 g/dL (ref 14.1–18.1)
Lymph, poc: 2.6 (ref 0.6–3.4)
MCV: 84.9 fL (ref 80–97)
POC LYMPH PERCENT: 13.9 %L (ref 10–50)
RDW, POC: 14.8 %

## 2012-12-08 LAB — POCT UA - MICROSCOPIC ONLY: Yeast, UA: NEGATIVE

## 2012-12-08 MED ORDER — HYDROCODONE-ACETAMINOPHEN 5-325 MG PO TABS: ORAL_TABLET | ORAL | Status: DC

## 2012-12-08 MED ORDER — CIPROFLOXACIN HCL 500 MG PO TABS: 500.0000 mg | ORAL_TABLET | Freq: Two times a day (BID) | ORAL | Status: DC

## 2012-12-08 NOTE — Patient Instructions (Addendum)
You have a kidney infection called a polynephritis. Drink lots of water to make yourself pee very often.  If you are doing worse at anytime, or start running a high fever, come back in here or go to the emergency room if necessary  If you do not feel a lot better by Thursday come back here to get rechecked. I will be off but wanted the other doctors we'll recheck you.  If you are doing better, I want you to return in about one week for a recheck of your urine.  Take the ciprofloxacin antibiotic one pill twice a day, at breakfast and supper  Take the hydrocodone pain pills only when needed for bad pain.  Stay off of work for Wednesday and Thursday.   Pielonefritis - Adultos  (Pyelonephritis, Adult)  La pielonefritis es una infeccin del rin. Hay dos tipos principales de pielonefritis:   Una infeccin que se inicia rpidamente sin sntomas previos (pielonefritis [place]).  Infecciones que persisten por un largo perodo (pielonefritis crnica). CAUSAS  Hay dos causas principales:   Pasaje de bacterias desde la vejiga al rin. Este problema aparece especialmente en mujeres embarazadas. La orina en la vejiga puede infectarse por diferentes causas, por ejemplo:  Inflamacin de la prstata (prostatitis).  Durante las [employer].  Infeccin en la vejiga (cistitis).  Pasaje de bacterias desde la sangre hacia el rin. Las enfermedades que aumentan el riesgo son:   Diabetes.  Clculos renales o en la vescula.  Cncer.  Un catter colocado en la vejiga.  Otras anormalidades del rin o de Engineer, mining. SNTOMAS   Dolor abdominal  Dolor en la zona del costado o flanco.  Grant Ruts.  Escalofros.  Malestar estomacal.  Sangre en la orina Larose Kells).  Necesidad frecuente de orinar  Necesidad intensa o persistente de Geographical information systems officer.  Sensacin de ardor o pinchazos al [employer]. DIAGNSTICO  El mdico diagnosticar una infeccin en su rin basndose en los  sntomas. Tambin tomar [place].  TRATAMIENTO  Generalmente el tratamiento depende de la gravedad de la infeccin.   Si la infeccin es leve y se diagnostica a tiempo, el mdico lo tratar con antibiticos por va oral y lo dejar irse a su casa.  Si la infeccin es ms grave, la bacteria podra haber ingresado al torrente sanguneo. Esto requerir antibiticos por va intravenosa y Administrator, arts en el hospital. Los sntomas pueden incluir:  Fiebre alta.  Dolor intenso en un costado del cuerpo.  Escalofros  An despus de Financial risk analyst hospital, el mdico podr indicarle antibiticos por va oral durante cierto perodo de [place].  Podr prescribirle otros tratamientos segn la causa de la infeccin. INSTRUCCIONES PARA EL CUIDADO EN EL HOGAR   Tome los antibiticos como se le indic. Tmelos todos, aunque se sienta mejor.  Concurra para Education officer, environmental un control y asegurarse de que la infeccin ha desaparecido.  Debe ingerir gran cantidad de lquido para mantener la orina de tono claro o color amarillo plido.  Tome medicamentos para la vejiga si siente urgencia para Geographical information systems officer o lo hace con mucha frecuencia. SOLICITE ATENCIN MDICA DE INMEDIATO SI:   Tiene fiebre o sntomas persistentes durante ms de 2  3 das.  Tiene fiebre y los sntomas [address].  No puede tomar los antibiticos ni ingerir lquidos.  Comienza a sentir escalofros.  Siente debilidad extrema o se desmaya.  No mejora despus de 2 das de [place]. ASEGRESE DE QUE:   Comprende estas instrucciones.  Controlar su  enfermedad.  Solicitar ayuda de inmediato si no mejora o empeora. Document Released: 05/08/2005 Document Revised: 01/28/2012 [facility] Patient Information 2013 [place], [place].

## 2012-12-08 NOTE — Progress Notes (Signed)
Subjective: 3 days ago the patient has some blood through his urine. He then had flank pain in the left flank. He's had some pain in his left lower abdomen. He has some pain in the right lower abdomen. He's not been running any fevers. No nausea or vomiting. He didn't feel bad enough to work today.  Objective:   Mild to moderate left CVA tenderness. Mild right lower quadrant tenderness moderate left lower quadrant tenderness  [facility] reading (PRIMARY) by  Dr. Alwyn Ren Nonspecific KUB. One little calcification visible in the right renal area. No ureteral calcifications can be noted.   Results for orders placed in visit on 12/08/12  POCT UA - MICROSCOPIC ONLY      Result Value Range   WBC, Ur, HPF, POC tntc     RBC, urine, microscopic tntc     Bacteria, U Microscopic neg     Mucus, UA neg     Epithelial cells, urine per micros 0-3     Crystals, Ur, HPF, POC neg     Casts, Ur, LPF, POC neg     Yeast, UA neg    POCT URINALYSIS DIPSTICK      Result Value Range   Color, UA yellow     Clarity, UA cloudy     Glucose, UA neg     Bilirubin, UA neg     Ketones, UA neg     Spec Grav, UA 1.015     Blood, UA large     pH, UA 6.0     Protein, UA 100     Urobilinogen, UA 0.2     Nitrite, UA neg     Leukocytes, UA moderate (2+)    POCT CBC      Result Value Range   WBC 19.0 (*) 4.6 - 10.2 K/uL   Lymph, poc 2.6  0.6 - 3.4   POC LYMPH PERCENT 13.9  10 - 50 %L   MID (cbc) 1.1 (*) 0 - 0.9   POC MID % 5.8  0 - 12 %M   POC Granulocyte 15.3 (*) 2 - 6.9   Granulocyte percent 80.3 (*) 37 - 80 %G   RBC 5.66  4.69 - 6.13 M/uL   Hemoglobin 15.6  14.1 - 18.1 g/dL   HCT, POC 16.1  09.6 - 53.7 %   MCV 84.9  80 - 97 fL   MCH, POC 27.6  27 - 31.2 pg   MCHC 32.4  31.8 - 35.4 g/dL   RDW, POC 04.5     Platelet Count, POC 335  142 - 424 K/uL   MPV 10.4  0 - 99.8 fL   diagnoses: Acute polynephritis Possible kidney stones  Plan: Urine culture Ciprofloxacin 500 mg twice a day Norco 5 for  pain  Return in 2 days for recheck, sooner if worse  Consider renal ultrasound if not doing much better

## 2012-12-09 LAB — COMPREHENSIVE METABOLIC PANEL
Albumin: 3.9 g/dL (ref 3.5–5.2)
Alkaline Phosphatase: 83 U/L (ref 39–117)
BUN: 8 mg/dL (ref 6–23)
CO2: 24 mEq/L (ref 19–32)
Calcium: 9.2 mg/dL (ref 8.4–10.5)
Glucose, Bld: 97 mg/dL (ref 70–99)
Potassium: 3.9 mEq/L (ref 3.5–5.3)
Total Protein: 7.1 g/dL (ref 6.0–8.3)

## 2012-12-10 ENCOUNTER — Ambulatory Visit (INDEPENDENT_AMBULATORY_CARE_PROVIDER_SITE_OTHER): Payer: Commercial Managed Care - PPO | Admitting: Emergency Medicine

## 2012-12-10 VITALS — BP 131/83 | HR 77 | Temp 98.6°F | Resp 16 | Ht 74.0 in | Wt 326.0 lb

## 2012-12-10 DIAGNOSIS — N10 Acute tubulo-interstitial nephritis: Secondary | ICD-10-CM

## 2012-12-10 NOTE — Patient Instructions (Addendum)
  Pielonefritis - Adultos  (Pyelonephritis, Adult)  La pielonefritis es una infeccin del rin. Hay dos tipos principales de pielonefritis:   Una infeccin que se inicia rpidamente sin sntomas previos (pielonefritis [place]).  Infecciones que persisten por un largo perodo (pielonefritis crnica). CAUSAS  Hay dos causas principales:   Pasaje de bacterias desde la vejiga al rin. Este problema aparece especialmente en mujeres embarazadas. La orina en la vejiga puede infectarse por diferentes causas, por ejemplo:  Inflamacin de la prstata (prostatitis).  Durante las [employer].  Infeccin en la vejiga (cistitis).  Pasaje de bacterias desde la sangre hacia el rin. Las enfermedades que aumentan el riesgo son:   Diabetes.  Clculos renales o en la vescula.  Cncer.  Un catter colocado en la vejiga.  Otras anormalidades del rin o de Engineer, mining. SNTOMAS   Dolor abdominal  Dolor en la zona del costado o flanco.  Grant Ruts.  Escalofros.  Malestar estomacal.  Sangre en la orina Larose Kells).  Necesidad frecuente de orinar  Necesidad intensa o persistente de Geographical information systems officer.  Sensacin de ardor o pinchazos al [employer]. DIAGNSTICO  El mdico diagnosticar una infeccin en su rin basndose en los sntomas. Tambin tomar [place].  TRATAMIENTO  Generalmente el tratamiento depende de la gravedad de la infeccin.   Si la infeccin es leve y se diagnostica a tiempo, el mdico lo tratar con antibiticos por va oral y lo dejar irse a su casa.  Si la infeccin es ms grave, la bacteria podra haber ingresado al torrente sanguneo. Esto requerir antibiticos por va intravenosa y Administrator, arts en el hospital. Los sntomas pueden incluir:  Fiebre alta.  Dolor intenso en un costado del cuerpo.  Escalofros  An despus de Financial risk analyst hospital, el mdico podr indicarle antibiticos por va oral durante cierto perodo de  [place].  Podr prescribirle otros tratamientos segn la causa de la infeccin. INSTRUCCIONES PARA EL CUIDADO EN EL HOGAR   Tome los antibiticos como se le indic. Tmelos todos, aunque se sienta mejor.  Concurra para Education officer, environmental un control y asegurarse de que la infeccin ha desaparecido.  Debe ingerir gran cantidad de lquido para mantener la orina de tono claro o color amarillo plido.  Tome medicamentos para la vejiga si siente urgencia para Geographical information systems officer o lo hace con mucha frecuencia. SOLICITE ATENCIN MDICA DE INMEDIATO SI:   Tiene fiebre o sntomas persistentes durante ms de 2  3 das.  Tiene fiebre y los sntomas [address].  No puede tomar los antibiticos ni ingerir lquidos.  Comienza a sentir escalofros.  Siente debilidad extrema o se desmaya.  No mejora despus de 2 das de [place]. ASEGRESE DE QUE:   Comprende estas instrucciones.  Controlar su enfermedad.  Solicitar ayuda de inmediato si no mejora o empeora. Document Released: 05/08/2005 Document Revised: 01/28/2012 [facility] Patient Information 2013 [place], [place].
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
## 2012-12-11 NOTE — Progress Notes (Signed)
Reviewed and agree.

## 2012-12-12 LAB — URINE CULTURE: Colony Count: 90000

## 2012-12-16 ENCOUNTER — Other Ambulatory Visit: Payer: Self-pay | Admitting: *Deleted

## 2012-12-16 MED ORDER — SULFAMETHOXAZOLE-TRIMETHOPRIM 800-160 MG PO TABS: 1.0000 | ORAL_TABLET | Freq: Two times a day (BID) | ORAL | Status: DC

## 2012-12-17 ENCOUNTER — Ambulatory Visit: Payer: Commercial Managed Care - PPO | Admitting: Internal Medicine

## 2013-01-19 ENCOUNTER — Encounter: Payer: Self-pay | Admitting: Dietician

## 2013-02-11 ENCOUNTER — Other Ambulatory Visit: Payer: Self-pay | Admitting: *Deleted

## 2013-02-11 DIAGNOSIS — E1165 Type 2 diabetes mellitus with hyperglycemia: Secondary | ICD-10-CM

## 2013-02-11 DIAGNOSIS — E785 Hyperlipidemia, unspecified: Secondary | ICD-10-CM

## 2013-02-11 DIAGNOSIS — I10 Essential (primary) hypertension: Secondary | ICD-10-CM

## 2013-02-11 DIAGNOSIS — K219 Gastro-esophageal reflux disease without esophagitis: Secondary | ICD-10-CM

## 2013-02-11 DIAGNOSIS — R7302 Impaired glucose tolerance (oral): Secondary | ICD-10-CM

## 2013-02-11 DIAGNOSIS — B009 Herpesviral infection, unspecified: Secondary | ICD-10-CM

## 2013-02-11 DIAGNOSIS — N481 Balanitis: Secondary | ICD-10-CM

## 2013-02-15 MED ORDER — PANTOPRAZOLE SODIUM 40 MG PO TBEC: 40.0000 mg | DELAYED_RELEASE_TABLET | Freq: Every day | ORAL | Status: DC

## 2013-02-18 ENCOUNTER — Other Ambulatory Visit: Payer: Self-pay | Admitting: *Deleted

## 2013-02-18 ENCOUNTER — Other Ambulatory Visit: Payer: Self-pay

## 2013-02-18 DIAGNOSIS — IMO0002 Reserved for concepts with insufficient information to code with codable children: Secondary | ICD-10-CM

## 2013-02-18 DIAGNOSIS — R7302 Impaired glucose tolerance (oral): Secondary | ICD-10-CM

## 2013-02-18 DIAGNOSIS — B009 Herpesviral infection, unspecified: Secondary | ICD-10-CM

## 2013-02-18 DIAGNOSIS — E1165 Type 2 diabetes mellitus with hyperglycemia: Secondary | ICD-10-CM

## 2013-02-18 DIAGNOSIS — E785 Hyperlipidemia, unspecified: Secondary | ICD-10-CM

## 2013-02-18 DIAGNOSIS — I10 Essential (primary) hypertension: Secondary | ICD-10-CM

## 2013-02-18 DIAGNOSIS — K219 Gastro-esophageal reflux disease without esophagitis: Secondary | ICD-10-CM

## 2013-02-18 DIAGNOSIS — N481 Balanitis: Secondary | ICD-10-CM

## 2013-02-18 MED ORDER — METOPROLOL TARTRATE 25 MG PO TABS: 25.0000 mg | ORAL_TABLET | Freq: Two times a day (BID) | ORAL | Status: DC

## 2013-02-18 MED ORDER — SIMVASTATIN 40 MG PO TABS: 40.0000 mg | ORAL_TABLET | Freq: Every evening | ORAL | Status: DC

## 2013-02-18 MED ORDER — METFORMIN HCL 850 MG PO TABS: 850.0000 mg | ORAL_TABLET | Freq: Two times a day (BID) | ORAL | Status: DC

## 2013-02-18 MED ORDER — HYDROCHLOROTHIAZIDE 12.5 MG PO CAPS: 12.5000 mg | ORAL_CAPSULE | Freq: Every day | ORAL | Status: DC

## 2013-02-25 ENCOUNTER — Encounter: Payer: Self-pay | Admitting: Internal Medicine

## 2013-02-25 ENCOUNTER — Ambulatory Visit (INDEPENDENT_AMBULATORY_CARE_PROVIDER_SITE_OTHER): Payer: Commercial Managed Care - PPO | Admitting: Internal Medicine

## 2013-02-25 VITALS — BP 130/86 | HR 68 | Temp 98.0°F | Ht 74.5 in | Wt 343.4 lb

## 2013-02-25 DIAGNOSIS — R600 Localized edema: Secondary | ICD-10-CM

## 2013-02-25 DIAGNOSIS — K219 Gastro-esophageal reflux disease without esophagitis: Secondary | ICD-10-CM

## 2013-02-25 DIAGNOSIS — I10 Essential (primary) hypertension: Secondary | ICD-10-CM

## 2013-02-25 DIAGNOSIS — R609 Edema, unspecified: Secondary | ICD-10-CM

## 2013-02-25 DIAGNOSIS — E785 Hyperlipidemia, unspecified: Secondary | ICD-10-CM

## 2013-02-25 DIAGNOSIS — E1165 Type 2 diabetes mellitus with hyperglycemia: Secondary | ICD-10-CM

## 2013-02-25 LAB — GLUCOSE, CAPILLARY: Glucose-Capillary: 159 mg/dL — ABNORMAL HIGH (ref 70–99)

## 2013-02-25 MED ORDER — ONETOUCH DELICA LANCETS FINE MISC: 1.0000 | Freq: Every day | Status: DC

## 2013-02-25 NOTE — Assessment & Plan Note (Addendum)
I had seen him last year when he presented with weight gain and LE edema. He lost almost 50 lbs on lasix (now off). His ECHO, LE dopplers, creatinine, liver fxn, and TSH were all nl. The swelling resolved. He now thinks his legs are bigger. There is no edema on exam. Weight is up a bit from 326 in April to 343 today. We never found a reason so might be 2/2 CVI. He is to call if weight increases or legs swell. Will repeat micro alb next visit.

## 2013-02-25 NOTE — Progress Notes (Signed)
  Subjective:    Patient ID: Brian Reilly, male    DOB: February 03, 1961, 52 y.o.   MRN: [id number]  HPI  Please see the A&P for the status of the pt's chronic medical problems.  Review of Systems  Constitutional: Positive for unexpected weight change.  Cardiovascular: Positive for leg swelling.  Genitourinary: Negative for dysuria and discharge.  Musculoskeletal: Negative for back pain.  Neurological: Positive for numbness.       Objective:   Physical Exam  Constitutional: He is oriented to person, place, and time. He appears well-developed and well-nourished. No distress.  HENT:  Head: Normocephalic and atraumatic.  Right Ear: External ear normal.  Left Ear: External ear normal.  Nose: Nose normal.  Eyes: Conjunctivae and EOM are normal.  Cardiovascular: Normal rate, regular rhythm and normal heart sounds.   Pulmonary/Chest: Effort normal.  Abdominal: Bowel sounds are normal.  Musculoskeletal: Normal range of motion. He exhibits no edema and no tenderness.  Neurological: He is alert and oriented to person, place, and time.  Skin: Skin is warm and dry. He is not diaphoretic.  Psychiatric: He has a normal mood and affect. His behavior is normal. Judgment and thought content normal.          Assessment & Plan:

## 2013-02-25 NOTE — Assessment & Plan Note (Signed)
He is taking his PPI and is asymptomatic.

## 2013-02-25 NOTE — Patient Instructions (Signed)
1. Your sugar is great!! 2. Continue to take your metformin. 3. I called [facility] and you have refills on all of your medicines 4. See Dr Kem Kays in three months 5. Call us if your legs get big

## 2013-02-25 NOTE — Assessment & Plan Note (Addendum)
Lab Results  Component Value Date   HGBA1C 5.6 02/25/2013    Reviewed his CBG log and checking once or twice a day. Fasting AM (100 - 104) and 1 hour post dinner (104 - 147). A1c has decreased from 10.8 to 5.6! Taking metformin BID and no side effects. Called [facility] and is getting the med regularly and has refills. Foot exam today. Due to language barrier, I did not discuss other DM preventative issues. He will return in 3 months.   Will need microalb on next visit as had had protienuria on dipstick but was sxs with pyleo.

## 2013-02-25 NOTE — Assessment & Plan Note (Signed)
Called [facility] and [place] getting the statin regularly. Not at goal. Can repeat and escalate therapy at next visit.

## 2013-02-25 NOTE — Assessment & Plan Note (Signed)
BP well controlled. Called [facility] and is getting his metoprolol 25 BID, HCTZ 12.5 QD regularly.

## 2013-08-26 LAB — HM DIABETES EYE EXAM

## 2013-09-23 ENCOUNTER — Encounter: Payer: Self-pay | Admitting: Internal Medicine

## 2013-09-23 ENCOUNTER — Ambulatory Visit (INDEPENDENT_AMBULATORY_CARE_PROVIDER_SITE_OTHER): Payer: Commercial Managed Care - PPO | Admitting: Internal Medicine

## 2013-09-23 VITALS — BP 162/94 | HR 86 | Temp 97.5°F | Ht 74.5 in | Wt 353.7 lb

## 2013-09-23 DIAGNOSIS — E785 Hyperlipidemia, unspecified: Secondary | ICD-10-CM

## 2013-09-23 DIAGNOSIS — E1165 Type 2 diabetes mellitus with hyperglycemia: Secondary | ICD-10-CM

## 2013-09-23 DIAGNOSIS — E118 Type 2 diabetes mellitus with unspecified complications: Principal | ICD-10-CM

## 2013-09-23 DIAGNOSIS — N476 Balanoposthitis: Secondary | ICD-10-CM

## 2013-09-23 DIAGNOSIS — B009 Herpesviral infection, unspecified: Secondary | ICD-10-CM

## 2013-09-23 DIAGNOSIS — IMO0002 Reserved for concepts with insufficient information to code with codable children: Secondary | ICD-10-CM

## 2013-09-23 DIAGNOSIS — K219 Gastro-esophageal reflux disease without esophagitis: Secondary | ICD-10-CM

## 2013-09-23 DIAGNOSIS — R7309 Other abnormal glucose: Secondary | ICD-10-CM

## 2013-09-23 DIAGNOSIS — I10 Essential (primary) hypertension: Secondary | ICD-10-CM

## 2013-09-23 DIAGNOSIS — E119 Type 2 diabetes mellitus without complications: Secondary | ICD-10-CM

## 2013-09-23 DIAGNOSIS — N481 Balanitis: Secondary | ICD-10-CM

## 2013-09-23 DIAGNOSIS — R7302 Impaired glucose tolerance (oral): Secondary | ICD-10-CM

## 2013-09-23 LAB — HM DIABETES EYE EXAM

## 2013-09-23 LAB — COMPLETE METABOLIC PANEL WITH GFR
ALT: 46 U/L (ref 0–53)
AST: 26 U/L (ref 0–37)
Albumin: 4.3 g/dL (ref 3.5–5.2)
Alkaline Phosphatase: 96 U/L (ref 39–117)
BUN: 7 mg/dL (ref 6–23)
CALCIUM: 9.6 mg/dL (ref 8.4–10.5)
CHLORIDE: 99 meq/L (ref 96–112)
CO2: 30 meq/L (ref 19–32)
CREATININE: 0.74 mg/dL (ref 0.50–1.35)
GFR, Est African American: >89 mL/min
GLUCOSE: 122 mg/dL — AB (ref 70–99)
Potassium: 4.3 mEq/L (ref 3.5–5.3)
Sodium: 139 mEq/L (ref 135–145)
Total Bilirubin: 0.5 mg/dL (ref 0.2–1.2)
Total Protein: 7.4 g/dL (ref 6.0–8.3)

## 2013-09-23 LAB — CBC WITH DIFFERENTIAL/PLATELET
Basophils Absolute: 0.1 10*3/uL (ref 0.0–0.1)
Basophils Relative: 1 % (ref 0–1)
EOS ABS: 0.7 10*3/uL (ref 0.0–0.7)
Eosinophils Relative: 6 % — ABNORMAL HIGH (ref 0–5)
HEMATOCRIT: 49 % (ref 39.0–52.0)
HEMOGLOBIN: 16.3 g/dL (ref 13.0–17.0)
LYMPHS ABS: 2.9 10*3/uL (ref 0.7–4.0)
Lymphocytes Relative: 25 % (ref 12–46)
MCH: 27.8 pg (ref 26.0–34.0)
MCHC: 33.3 g/dL (ref 30.0–36.0)
MCV: 83.5 fL (ref 78.0–100.0)
MONO ABS: 0.8 10*3/uL (ref 0.1–1.0)
Monocytes Relative: 7 % (ref 3–12)
Neutro Abs: 7 10*3/uL (ref 1.7–7.7)
Neutrophils Relative %: 61 % (ref 43–77)
Platelets: 309 10*3/uL (ref 150–400)
RBC: 5.87 MIL/uL — ABNORMAL HIGH (ref 4.22–5.81)
RDW: 14.3 % (ref 11.5–15.5)
WBC: 11.6 10*3/uL — AB (ref 4.0–10.5)

## 2013-09-23 LAB — LIPID PANEL
CHOL/HDL RATIO: 4.1 ratio
Cholesterol: 171 mg/dL (ref 0–200)
HDL: 42 mg/dL (ref >39)
LDL CALC: 100 mg/dL — AB (ref 0–99)
Triglycerides: 146 mg/dL (ref <150)
VLDL: 29 mg/dL (ref 0–40)

## 2013-09-23 LAB — POCT GLYCOSYLATED HEMOGLOBIN (HGB A1C): Hemoglobin A1C: 6.3

## 2013-09-23 LAB — GLUCOSE, CAPILLARY: Glucose-Capillary: 154 mg/dL — ABNORMAL HIGH (ref 70–99)

## 2013-09-23 MED ORDER — METOPROLOL TARTRATE 25 MG PO TABS: 25.0000 mg | ORAL_TABLET | Freq: Two times a day (BID) | ORAL | Status: DC

## 2013-09-23 MED ORDER — NITROGLYCERIN 0.4 MG SL SUBL: SUBLINGUAL_TABLET | SUBLINGUAL | Status: DC

## 2013-09-23 MED ORDER — PANTOPRAZOLE SODIUM 40 MG PO TBEC: 40.0000 mg | DELAYED_RELEASE_TABLET | Freq: Every day | ORAL | Status: DC

## 2013-09-23 MED ORDER — HYDROCHLOROTHIAZIDE 12.5 MG PO CAPS: 12.5000 mg | ORAL_CAPSULE | Freq: Every day | ORAL | Status: DC

## 2013-09-23 MED ORDER — METFORMIN HCL 850 MG PO TABS: 850.0000 mg | ORAL_TABLET | Freq: Two times a day (BID) | ORAL | Status: DC

## 2013-09-23 MED ORDER — ASPIRIN 81 MG PO TBEC: 81.0000 mg | DELAYED_RELEASE_TABLET | Freq: Every day | ORAL | Status: DC

## 2013-09-23 MED ORDER — LISINOPRIL 2.5 MG PO TABS: 2.5000 mg | ORAL_TABLET | Freq: Every day | ORAL | Status: DC

## 2013-09-23 MED ORDER — ROSUVASTATIN CALCIUM 20 MG PO TABS: 20.0000 mg | ORAL_TABLET | Freq: Every day | ORAL | Status: DC

## 2013-09-23 NOTE — Patient Instructions (Addendum)
-  Start lisinopril 2.5 mg daily. -Stop stimvastatin -Start rosuvastatin 20 mg daily. -Blood work today. -Return in 2 weeks for repeat blood work (potassium). -Return to clinic in 3 months for follow up.

## 2013-09-23 NOTE — Progress Notes (Signed)
   Subjective:    Patient ID: Brian Reilly, male    DOB: 02/28/1961, 53 y.o.   MRN: [id number]  HPI Feels well today. Discussed his current treatment and answered questions. Admits to some slight leg swelling at the end of the day which resolves. Denies PND, orthopnea. He checks his BG rarely but denies hypoglycemia.   Review of Systems  Constitutional: Negative for fever and chills.  HENT: Negative for trouble swallowing.   Respiratory: Negative for chest tightness and shortness of breath.   Cardiovascular: Positive for leg swelling. Negative for chest pain.  Gastrointestinal: Negative for nausea, vomiting and blood in stool.  Genitourinary: Negative for dysuria, urgency, decreased urine volume, discharge, penile swelling, scrotal swelling, difficulty urinating and penile pain.  Neurological: Negative for syncope and weakness.  Psychiatric/Behavioral: Negative for confusion, dysphoric mood and decreased concentration.       Objective:   Physical Exam  Constitutional: He is oriented to person, place, and time. He appears well-developed and well-nourished.  HENT:  Head: Normocephalic and atraumatic.  Eyes: Conjunctivae and EOM are normal. Pupils are equal, round, and reactive to light. Right eye exhibits no discharge.  Neck: Normal range of motion. Neck supple. No JVD present. No thyromegaly present.  Cardiovascular: Normal rate, regular rhythm, normal heart sounds and intact distal pulses.  Exam reveals no friction rub.   No murmur heard. Pulmonary/Chest: Effort normal and breath sounds normal. No respiratory distress. He has no wheezes.  Abdominal: Soft. Bowel sounds are normal. He exhibits no distension and no mass. There is no tenderness.  Musculoskeletal: Normal range of motion.  Trace pitting edema bilat LE  Neurological: He is alert and oriented to person, place, and time.  Skin: Skin is warm.   Filed Vitals:   09/23/13 0956  BP: 162/94  Pulse: 86  Temp: 97.5 F (36.4  C)       Assessment & Plan:  53 yr. Old male with recently diagnosed DM type 2, HTN, CAD, OSA on CPAP, presents for follow up discussion of newly diagnosed DM.   1) Type 2 DM:  HgA1C is 6.3%. Continue current management. Today will obtain urine microalbumin.   2) HL: Recheck lipid panel. 10 year risk is 19.1%. Change simvastatin to rosuvastatin 20 mg daily.  3) HTN: Not on target. Add lisinopril to therapy, 2.5 mg daily.  4) GERD: Protonix daily, he wishes to continue this.  5) Obesity: Body mass index is 44.82 kg/(m^2). Educated on diet.  6) Health Maintenance: Colonoscopy performed in 2011, repeat needed in 2011.   Return in 3 months.  Jonah BlueAlejandro Jette Lewan, DO, FACP [facility] 09/23/2013, 10:52 AM

## 2013-09-24 LAB — MICROALBUMIN / CREATININE URINE RATIO
Creatinine, Urine: 134.4 mg/dL
Microalb Creat Ratio: 28.1 mg/g (ref 0.0–30.0)
Microalb, Ur: 3.77 mg/dL — ABNORMAL HIGH (ref 0.00–1.89)

## 2013-09-24 LAB — TSH: TSH: 1.055 u[IU]/mL (ref 0.350–4.500)

## 2013-09-27 ENCOUNTER — Other Ambulatory Visit: Payer: Self-pay | Admitting: Internal Medicine

## 2013-09-27 MED ORDER — SIMVASTATIN 40 MG PO TABS: 40.0000 mg | ORAL_TABLET | Freq: Every evening | ORAL | Status: DC

## 2013-10-13 ENCOUNTER — Other Ambulatory Visit (INDEPENDENT_AMBULATORY_CARE_PROVIDER_SITE_OTHER): Payer: Commercial Managed Care - PPO

## 2013-10-13 DIAGNOSIS — B009 Herpesviral infection, unspecified: Secondary | ICD-10-CM

## 2013-10-13 DIAGNOSIS — K219 Gastro-esophageal reflux disease without esophagitis: Secondary | ICD-10-CM

## 2013-10-13 DIAGNOSIS — R7302 Impaired glucose tolerance (oral): Secondary | ICD-10-CM

## 2013-10-13 DIAGNOSIS — E1165 Type 2 diabetes mellitus with hyperglycemia: Secondary | ICD-10-CM

## 2013-10-13 DIAGNOSIS — E118 Type 2 diabetes mellitus with unspecified complications: Secondary | ICD-10-CM

## 2013-10-13 DIAGNOSIS — N481 Balanitis: Secondary | ICD-10-CM

## 2013-10-13 DIAGNOSIS — I10 Essential (primary) hypertension: Secondary | ICD-10-CM

## 2013-10-13 DIAGNOSIS — IMO0002 Reserved for concepts with insufficient information to code with codable children: Secondary | ICD-10-CM

## 2013-10-13 DIAGNOSIS — E785 Hyperlipidemia, unspecified: Secondary | ICD-10-CM

## 2013-10-13 LAB — BASIC METABOLIC PANEL WITH GFR
BUN: 8 mg/dL (ref 6–23)
CHLORIDE: 102 meq/L (ref 96–112)
CO2: 28 mEq/L (ref 19–32)
CREATININE: 0.81 mg/dL (ref 0.50–1.35)
Calcium: 9.5 mg/dL (ref 8.4–10.5)
GFR, Est African American: >89 mL/min
GFR, Est Non African American: >89 mL/min
Glucose, Bld: 117 mg/dL — ABNORMAL HIGH (ref 70–99)
Potassium: 4.5 mEq/L (ref 3.5–5.3)
Sodium: 138 mEq/L (ref 135–145)

## 2013-12-20 ENCOUNTER — Other Ambulatory Visit: Payer: Self-pay | Admitting: Internal Medicine

## 2014-02-15 ENCOUNTER — Other Ambulatory Visit: Payer: Self-pay | Admitting: Internal Medicine

## 2014-03-23 ENCOUNTER — Other Ambulatory Visit: Payer: Self-pay | Admitting: *Deleted

## 2014-03-23 DIAGNOSIS — IMO0002 Reserved for concepts with insufficient information to code with codable children: Secondary | ICD-10-CM

## 2014-03-23 DIAGNOSIS — E1165 Type 2 diabetes mellitus with hyperglycemia: Secondary | ICD-10-CM

## 2014-03-23 DIAGNOSIS — I10 Essential (primary) hypertension: Secondary | ICD-10-CM

## 2014-03-23 DIAGNOSIS — E118 Type 2 diabetes mellitus with unspecified complications: Secondary | ICD-10-CM

## 2014-03-23 DIAGNOSIS — E785 Hyperlipidemia, unspecified: Secondary | ICD-10-CM

## 2014-03-23 DIAGNOSIS — R7302 Impaired glucose tolerance (oral): Secondary | ICD-10-CM

## 2014-03-23 DIAGNOSIS — B009 Herpesviral infection, unspecified: Secondary | ICD-10-CM

## 2014-03-23 DIAGNOSIS — N481 Balanitis: Secondary | ICD-10-CM

## 2014-03-24 MED ORDER — PANTOPRAZOLE SODIUM 40 MG PO TBEC: 40.0000 mg | DELAYED_RELEASE_TABLET | Freq: Every day | ORAL | Status: DC

## 2014-04-22 ENCOUNTER — Other Ambulatory Visit: Payer: Self-pay | Admitting: Internal Medicine

## 2014-04-28 ENCOUNTER — Ambulatory Visit (INDEPENDENT_AMBULATORY_CARE_PROVIDER_SITE_OTHER): Payer: Commercial Managed Care - PPO | Admitting: Physician Assistant

## 2014-04-28 VITALS — BP 130/80 | HR 72 | Temp 98.8°F | Resp 16 | Ht 74.0 in | Wt 360.4 lb

## 2014-04-28 DIAGNOSIS — S335XXA Sprain of ligaments of lumbar spine, initial encounter: Secondary | ICD-10-CM

## 2014-04-28 DIAGNOSIS — S39012A Strain of muscle, fascia and tendon of lower back, initial encounter: Secondary | ICD-10-CM

## 2014-04-28 MED ORDER — MELOXICAM 15 MG PO TABS: 15.0000 mg | ORAL_TABLET | Freq: Every day | ORAL | Status: DC

## 2014-04-28 MED ORDER — CYCLOBENZAPRINE HCL 5 MG PO TABS: 5.0000 mg | ORAL_TABLET | Freq: Three times a day (TID) | ORAL | Status: DC | PRN

## 2014-04-28 NOTE — Progress Notes (Signed)
   Subjective:    Patient ID: Brian Reilly, male    DOB: [birth year], 53 y.o.   MRN: [id number]  HPI  Pt presents to clinic with low back pain for the last 3 days that is slightly more on the right side.  He did not have an injury that he knows of.  He has worsening pain with movement - bending and standing from sitting position.  He has had no B/B incontinence.  The pain is in the same location as well it started but it feels slight worse as time has gone on.    Review of Systems  Constitutional: Negative for fever and chills.  Genitourinary: Negative for dysuria, urgency and frequency.  Musculoskeletal: Positive for back pain.       Objective:   Physical Exam  Vitals reviewed. Constitutional: He is oriented to person, place, and time. He appears well-developed and well-nourished.  HENT:  Head: Normocephalic and atraumatic.  Right Ear: External ear normal.  Left Ear: External ear normal.  Pulmonary/Chest: Effort normal.  Musculoskeletal:       Lumbar back: He exhibits spasm (para lumbar spinal area). He exhibits normal range of motion, no tenderness and no bony tenderness.  Pt is able to have FROM of lumbar spine but he has pain between erect and 90 degree flexion in his lumbar spine. Neg SLR  Neurological: He is alert and oriented to person, place, and time. He has normal strength and normal reflexes. No cranial nerve deficit or sensory deficit.  Skin: Skin is warm and dry.  Psychiatric: He has a normal mood and affect. His behavior is normal. Judgment and thought content normal.       Assessment & Plan:  Lumbar strain, initial encounter - Plan: cyclobenzaprine (FLEXERIL) 5 MG tablet, meloxicam (MOBIC) 15 MG tablet  Heat and stretches.  Recheck 1 week is no better sooner if worse.  Benny Lennert PA-C  [facility] 04/28/2014 8:13 PM

## 2014-04-28 NOTE — Patient Instructions (Signed)

## 2014-05-10 ENCOUNTER — Encounter: Payer: Self-pay | Admitting: *Deleted

## 2014-05-31 ENCOUNTER — Emergency Department (HOSPITAL_COMMUNITY)
Admission: EM | Admit: 2014-05-31 | Discharge: 2014-05-31 | Disposition: A | Discharge status: Home / Self Care | Payer: Commercial Managed Care - PPO | Attending: Emergency Medicine | Admitting: Emergency Medicine

## 2014-05-31 ENCOUNTER — Emergency Department (HOSPITAL_COMMUNITY): Payer: Commercial Managed Care - PPO

## 2014-05-31 ENCOUNTER — Encounter (HOSPITAL_COMMUNITY): Payer: Self-pay | Admitting: Emergency Medicine

## 2014-05-31 DIAGNOSIS — W01198A Fall on same level from slipping, tripping and stumbling with subsequent striking against other object, initial encounter: Secondary | ICD-10-CM | POA: Diagnosis not present

## 2014-05-31 DIAGNOSIS — Z23 Encounter for immunization: Secondary | ICD-10-CM | POA: Diagnosis not present

## 2014-05-31 DIAGNOSIS — E669 Obesity, unspecified: Secondary | ICD-10-CM | POA: Insufficient documentation

## 2014-05-31 DIAGNOSIS — Y9289 Other specified places as the place of occurrence of the external cause: Secondary | ICD-10-CM | POA: Diagnosis not present

## 2014-05-31 DIAGNOSIS — S62639A Displaced fracture of distal phalanx of unspecified finger, initial encounter for closed fracture: Secondary | ICD-10-CM

## 2014-05-31 DIAGNOSIS — Z7982 Long term (current) use of aspirin: Secondary | ICD-10-CM | POA: Diagnosis not present

## 2014-05-31 DIAGNOSIS — S62661A Nondisplaced fracture of distal phalanx of left index finger, initial encounter for closed fracture: Secondary | ICD-10-CM | POA: Diagnosis not present

## 2014-05-31 DIAGNOSIS — Y9389 Activity, other specified: Secondary | ICD-10-CM | POA: Insufficient documentation

## 2014-05-31 DIAGNOSIS — Z87448 Personal history of other diseases of urinary system: Secondary | ICD-10-CM | POA: Diagnosis not present

## 2014-05-31 DIAGNOSIS — E785 Hyperlipidemia, unspecified: Secondary | ICD-10-CM | POA: Insufficient documentation

## 2014-05-31 DIAGNOSIS — Z791 Long term (current) use of non-steroidal anti-inflammatories (NSAID): Secondary | ICD-10-CM | POA: Diagnosis not present

## 2014-05-31 DIAGNOSIS — Z8669 Personal history of other diseases of the nervous system and sense organs: Secondary | ICD-10-CM | POA: Diagnosis not present

## 2014-05-31 DIAGNOSIS — S61211A Laceration without foreign body of left index finger without damage to nail, initial encounter: Secondary | ICD-10-CM | POA: Insufficient documentation

## 2014-05-31 DIAGNOSIS — S61219A Laceration without foreign body of unspecified finger without damage to nail, initial encounter: Secondary | ICD-10-CM

## 2014-05-31 DIAGNOSIS — Z79899 Other long term (current) drug therapy: Secondary | ICD-10-CM | POA: Insufficient documentation

## 2014-05-31 DIAGNOSIS — S6992XA Unspecified injury of left wrist, hand and finger(s), initial encounter: Secondary | ICD-10-CM | POA: Diagnosis present

## 2014-05-31 DIAGNOSIS — K219 Gastro-esophageal reflux disease without esophagitis: Secondary | ICD-10-CM | POA: Insufficient documentation

## 2014-05-31 DIAGNOSIS — S20311A Abrasion of right front wall of thorax, initial encounter: Secondary | ICD-10-CM | POA: Diagnosis not present

## 2014-05-31 MED ORDER — CEPHALEXIN 500 MG PO CAPS: 500.0000 mg | ORAL_CAPSULE | Freq: Four times a day (QID) | ORAL | Status: DC

## 2014-05-31 MED ORDER — LIDOCAINE HCL 1 % IJ SOLN
10.0000 mL | Freq: Once | INTRAMUSCULAR | Status: AC
  Administered 2014-05-31: 10 mL

## 2014-05-31 MED ORDER — TRAMADOL-ACETAMINOPHEN 37.5-325 MG PO TABS: 1.0000 | ORAL_TABLET | Freq: Four times a day (QID) | ORAL | Status: DC | PRN

## 2014-05-31 MED ORDER — OXYCODONE-ACETAMINOPHEN 5-325 MG PO TABS
2.0000 | ORAL_TABLET | Freq: Once | ORAL | Status: AC
  Administered 2014-05-31: 2 via ORAL
  Filled 2014-05-31: qty 2

## 2014-05-31 MED ORDER — TETANUS-DIPHTH-ACELL PERTUSSIS 5-2.5-18.5 LF-MCG/0.5 IM SUSP
0.5000 mL | Freq: Once | INTRAMUSCULAR | Status: AC
Start: 2014-05-31 — End: 2014-05-31
  Administered 2014-05-31: 0.5 mL via INTRAMUSCULAR
  Filled 2014-05-31: qty 0.5

## 2014-05-31 NOTE — ED Notes (Signed)
Pt returned from xray

## 2014-05-31 NOTE — ED Provider Notes (Signed)
Medical screening examination/treatment/procedure(s) were performed by non-physician practitioner and as supervising physician I was immediately available for consultation/collaboration.   EKG Interpretation None        Kawan Valladolid H Imani Fiebelkorn, MD 05/31/14 2334 

## 2014-05-31 NOTE — Discharge Instructions (Signed)
Take the prescribed medication as directed.  Wear splint and keep stitches clean and dry until removal in 1 week.  Sutures may be removed by your primary care physician. Follow-up with Dr. Amanda PeaGramig regarding finger fracture. Return to the ED for new or worsening symptoms.
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
## 2014-06-02 ENCOUNTER — Emergency Department (HOSPITAL_COMMUNITY)
Admission: EM | Admit: 2014-06-02 | Discharge: 2014-06-02 | Disposition: A | Discharge status: Home / Self Care | Payer: Commercial Managed Care - PPO | Attending: Emergency Medicine | Admitting: Emergency Medicine

## 2014-06-02 ENCOUNTER — Encounter (HOSPITAL_COMMUNITY): Payer: Self-pay | Admitting: Emergency Medicine

## 2014-06-02 DIAGNOSIS — Z8739 Personal history of other diseases of the musculoskeletal system and connective tissue: Secondary | ICD-10-CM | POA: Insufficient documentation

## 2014-06-02 DIAGNOSIS — Y9389 Activity, other specified: Secondary | ICD-10-CM | POA: Diagnosis not present

## 2014-06-02 DIAGNOSIS — E785 Hyperlipidemia, unspecified: Secondary | ICD-10-CM | POA: Diagnosis not present

## 2014-06-02 DIAGNOSIS — Z791 Long term (current) use of non-steroidal anti-inflammatories (NSAID): Secondary | ICD-10-CM | POA: Diagnosis not present

## 2014-06-02 DIAGNOSIS — G4730 Sleep apnea, unspecified: Secondary | ICD-10-CM | POA: Diagnosis not present

## 2014-06-02 DIAGNOSIS — S61219A Laceration without foreign body of unspecified finger without damage to nail, initial encounter: Secondary | ICD-10-CM

## 2014-06-02 DIAGNOSIS — S61211A Laceration without foreign body of left index finger without damage to nail, initial encounter: Secondary | ICD-10-CM | POA: Insufficient documentation

## 2014-06-02 DIAGNOSIS — Z87438 Personal history of other diseases of male genital organs: Secondary | ICD-10-CM | POA: Insufficient documentation

## 2014-06-02 DIAGNOSIS — E669 Obesity, unspecified: Secondary | ICD-10-CM | POA: Diagnosis not present

## 2014-06-02 DIAGNOSIS — Z8781 Personal history of (healed) traumatic fracture: Secondary | ICD-10-CM | POA: Diagnosis not present

## 2014-06-02 DIAGNOSIS — Y92524 Gas station as the place of occurrence of the external cause: Secondary | ICD-10-CM | POA: Diagnosis not present

## 2014-06-02 DIAGNOSIS — K219 Gastro-esophageal reflux disease without esophagitis: Secondary | ICD-10-CM | POA: Diagnosis not present

## 2014-06-02 DIAGNOSIS — Z7982 Long term (current) use of aspirin: Secondary | ICD-10-CM | POA: Diagnosis not present

## 2014-06-02 DIAGNOSIS — Z79899 Other long term (current) drug therapy: Secondary | ICD-10-CM | POA: Insufficient documentation

## 2014-06-02 DIAGNOSIS — W01198A Fall on same level from slipping, tripping and stumbling with subsequent striking against other object, initial encounter: Secondary | ICD-10-CM | POA: Diagnosis not present

## 2014-06-02 MED ORDER — LIDOCAINE HCL (PF) 1 % IJ SOLN
5.0000 mL | Freq: Once | INTRAMUSCULAR | Status: AC
  Administered 2014-06-02: 5 mL
  Filled 2014-06-02 (×2): qty 5

## 2014-06-02 NOTE — ED Notes (Signed)
Pt states he was here two days ago for finger lac and closed fracture of distal phalanx.  Pt states that when he was changing the bandage, the sutures opened back up.  Pt also c/o increased pain radiating out to mid hand.  And pt also states that the ortho he was referred to, is not able to see him.

## 2014-06-02 NOTE — Discharge Instructions (Signed)
Take your Keflex (antibiotic) as prescribed.  You were prescribed this at your last visit.  Follow up with the Hand Surgeon and the Primary Care Physician that you were referred to at your last visit.

## 2014-06-02 NOTE — ED Notes (Signed)
Ortho Tech paged.
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
## 2014-06-05 NOTE — ED Provider Notes (Signed)
Medical screening examination/treatment/procedure(s) were performed by non-physician practitioner and as supervising physician I was immediately available for consultation/collaboration.   EKG Interpretation None       Derwood KaplanAnkit Charletha Dalpe, MD 06/05/14 [phone]

## 2014-06-07 ENCOUNTER — Ambulatory Visit: Payer: Commercial Managed Care - PPO | Admitting: Internal Medicine

## 2014-06-08 ENCOUNTER — Ambulatory Visit: Payer: Commercial Managed Care - PPO | Admitting: Internal Medicine

## 2014-06-20 ENCOUNTER — Ambulatory Visit: Payer: Commercial Managed Care - PPO | Admitting: Internal Medicine

## 2014-06-20 ENCOUNTER — Ambulatory Visit (INDEPENDENT_AMBULATORY_CARE_PROVIDER_SITE_OTHER): Payer: Commercial Managed Care - PPO | Admitting: Internal Medicine

## 2014-06-20 ENCOUNTER — Encounter: Payer: Self-pay | Admitting: Internal Medicine

## 2014-06-20 VITALS — BP 148/90 | HR 77 | Temp 97.0°F | Ht 74.4 in | Wt 352.1 lb

## 2014-06-20 DIAGNOSIS — Z23 Encounter for immunization: Secondary | ICD-10-CM

## 2014-06-20 DIAGNOSIS — I10 Essential (primary) hypertension: Secondary | ICD-10-CM

## 2014-06-20 DIAGNOSIS — E119 Type 2 diabetes mellitus without complications: Secondary | ICD-10-CM

## 2014-06-20 DIAGNOSIS — Z0000 Encounter for general adult medical examination without abnormal findings: Secondary | ICD-10-CM | POA: Insufficient documentation

## 2014-06-20 DIAGNOSIS — S62661D Nondisplaced fracture of distal phalanx of left index finger, subsequent encounter for fracture with routine healing: Secondary | ICD-10-CM

## 2014-06-20 LAB — GLUCOSE, CAPILLARY: Glucose-Capillary: 116 mg/dL — ABNORMAL HIGH (ref 70–99)

## 2014-06-20 LAB — POCT GLYCOSYLATED HEMOGLOBIN (HGB A1C): Hemoglobin A1C: 6.3

## 2014-06-20 MED ORDER — LISINOPRIL 2.5 MG PO TABS: ORAL_TABLET | ORAL | Status: DC

## 2014-06-20 MED ORDER — HYDROCHLOROTHIAZIDE 12.5 MG PO CAPS: 12.5000 mg | ORAL_CAPSULE | Freq: Every day | ORAL | Status: DC

## 2014-06-20 MED ORDER — METOPROLOL TARTRATE 25 MG PO TABS: 25.0000 mg | ORAL_TABLET | Freq: Two times a day (BID) | ORAL | Status: AC
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
## 2014-06-20 NOTE — Assessment & Plan Note (Signed)
Has lost some weight but advised to continue to lose weight

## 2014-06-20 NOTE — Patient Instructions (Signed)
General Instructions:  Please bring your medicines with you each time you come to clinic.  Medicines may include prescription medications, over-the-counter medications, herbal remedies, eye drops, vitamins, or other pills.  reinicie sus medicamentos para la presin arterial : 2.5 mg de lisinopril diario y 12.5 mg de hidroclorotiazida al da y 25 mg de metoprolol dos veces al da  continuar metformina  por favor trabajar en la prdida de peso  regresar a la clnica en 2 semanas para el control de la presin arterial  continuar el seguimiento con el mdico ortopdico  Progress Toward Treatment Goals:  Treatment Goal 06/20/2014  Hemoglobin A1C at goal  Blood pressure at goal  Prevent falls deteriorated    Self Care Goals & Plans:  Self Care Goal 09/23/2013  Manage my medications take my medicines as prescribed; bring my medications to every visit; refill my medications on time  Monitor my health -  Eat healthy foods drink diet soda or water instead of juice or soda; eat more vegetables; eat foods that are low in salt; eat baked foods instead of fried foods  Other -    Home Blood Glucose Monitoring 06/20/2014  Check my blood sugar -  When to check my blood sugar before meals     Care Management & Community Referrals:  Referral 10/08/2012  Referrals made for care management support diabetes educator

## 2014-06-20 NOTE — Assessment & Plan Note (Signed)
Flu vaccine today, foot exam and a1c check today  Pneumococcal vaccine on next visit

## 2014-06-20 NOTE — Assessment & Plan Note (Signed)
Lab Results  Component Value Date   HGBA1C 6.3 06/20/2014   HGBA1C 6.3 09/23/2013   HGBA1C 5.6 02/25/2013    Assessment: Diabetes control: good control (HgbA1C at goal) Progress toward A1C goal:  at goal Comments: on metformin  Plan: Medications:  continue current medications metformin 850mg  bid Home glucose monitoring: Frequency:   Timing: before meals Instruction/counseling given: other instruction/counseling: weight loss Educational resources provided:   Self management tools provided:   Other plans: recheck a1c 3 months. Foot exam today

## 2014-06-20 NOTE — Assessment & Plan Note (Signed)
Following up with [facility]. Stitches have been removed. Completed course of keflex. Still has laceration of lower index finger palmer surface with healing. No visible drainage or erythema, but pain and edema still present.   -follow up with ortho, request records for pcp to review

## 2014-06-20 NOTE — Assessment & Plan Note (Signed)
BP Readings from Last 3 Encounters:  06/20/14 148/90  06/02/14 160/94  05/31/14 155/88   Lab Results  Component Value Date   NA 138 10/13/2013   K 4.5 10/13/2013   CREATININE 0.81 10/13/2013    Assessment: Blood pressure control: mildly elevated Progress toward BP goal:  at goal Comments: has not taken BP medications hctz and lisinopril for at least 2 weeks  Plan: Medications:  restart hctz 12.5mg  and lisinopril 2.5mg  and continue metoprolol 25mg  bid Educational resources provided:   Self management tools provided:   Other plans: recheck 2 week

## 2014-06-27 NOTE — Progress Notes (Signed)
Patient ID: Brian Reilly, male   DOB: 01/19/1961, 53 y.o.   MRN: [id number] [facility] Attending  Case discussed with Dr. Virgina OrganQureshi soon after the resident saw the patient.  We reviewed the resident's history and exam and pertinent patient test results.  I agree with the assessment, diagnosis, and plan of care documented in the resident's note.

## 2014-07-05 ENCOUNTER — Ambulatory Visit (INDEPENDENT_AMBULATORY_CARE_PROVIDER_SITE_OTHER): Payer: Commercial Managed Care - PPO | Admitting: Internal Medicine

## 2014-07-05 ENCOUNTER — Encounter: Payer: Self-pay | Admitting: Internal Medicine

## 2014-07-05 VITALS — BP 134/86 | HR 77 | Temp 98.4°F | Ht 74.0 in | Wt 350.9 lb

## 2014-07-05 DIAGNOSIS — E119 Type 2 diabetes mellitus without complications: Secondary | ICD-10-CM

## 2014-07-05 DIAGNOSIS — I10 Essential (primary) hypertension: Secondary | ICD-10-CM

## 2014-07-05 DIAGNOSIS — Z0000 Encounter for general adult medical examination without abnormal findings: Secondary | ICD-10-CM

## 2014-07-05 DIAGNOSIS — S62661D Nondisplaced fracture of distal phalanx of left index finger, subsequent encounter for fracture with routine healing: Secondary | ICD-10-CM

## 2014-07-05 LAB — GLUCOSE, CAPILLARY: Glucose-Capillary: 100 mg/dL — ABNORMAL HIGH (ref 70–99)

## 2014-07-05 NOTE — Assessment & Plan Note (Signed)
Patient received pneumococcal vaccine today.

## 2014-07-05 NOTE — Assessment & Plan Note (Signed)
Lab Results  Component Value Date   HGBA1C 6.3 06/20/2014   HGBA1C 6.3 09/23/2013   HGBA1C 5.6 02/25/2013     Assessment: Diabetes control: good control (HgbA1C at goal) Progress toward A1C goal:  at goal  Plan: Medications:  continue current medications: Metformin 850 mg twice a day Home glucose monitoring: Frequency:   daily

## 2014-07-05 NOTE — Assessment & Plan Note (Signed)
BP Readings from Last 3 Encounters:  07/05/14 134/86  06/20/14 148/90  06/02/14 160/94    Lab Results  Component Value Date   NA 138 10/13/2013   K 4.5 10/13/2013   CREATININE 0.81 10/13/2013    Assessment: Blood pressure control: controlled Progress toward BP goal:  at goal Comments: Patient was previously not taking his hydrochlorothiazide and lisinopril. Patient was resumed on hydrochlorothiazide 12.5, lisinopril 2.5 mg, and metoprolol 25 mg twice a day 2 weeks ago. Patient back in clinic for a blood pressure check.  Plan: Medications:  continue current medications

## 2014-07-05 NOTE — Assessment & Plan Note (Signed)
Patient with a history of laceration on his left finger status post stitching and a course of Keflex. Patient has persistent pain and pruritus along the lesion, though there is no surrounding drainage, erythema, or significant tenderness. The area is significant for induration is consistent with a keloid.   - Patient to follow-up with orthopedics in 2-1/2 weeks.  - Topical Benadryl as needed for pruritus  - Counseled patient on expectations regarding the natural course of healing which will include pruritus and pain, especially at a highly mobile area such as the interphalangeal joint.

## 2014-07-05 NOTE — Patient Instructions (Signed)
Please continue taking your medications for your blood pressure. You could use benadryl cream that you can get at the pharmacy over the counter that may help with some of the itching and pain.   General Instructions:   Please bring your medicines with you each time you come to clinic.  Medicines may include prescription medications, over-the-counter medications, herbal remedies, eye drops, vitamins, or other pills.   Progress Toward Treatment Goals:  Treatment Goal 07/05/2014  Hemoglobin A1C at goal  Blood pressure at goal  Prevent falls -    Self Care Goals & Plans:  Self Care Goal 07/05/2014  Manage my medications take my medicines as prescribed; bring my medications to every visit; refill my medications on time  Monitor my health -  Eat healthy foods drink diet soda or water instead of juice or soda; eat more vegetables; eat foods that are low in salt; eat baked foods instead of fried foods; eat fruit for snacks and desserts  Other -    Home Blood Glucose Monitoring 06/20/2014  Check my blood sugar -  When to check my blood sugar before meals     Care Management & Community Referrals:  Referral 10/08/2012  Referrals made for care management support diabetes educator

## 2014-07-05 NOTE — Progress Notes (Signed)
   Subjective:    Patient ID: Brian Reilly, male    DOB: 03/01/1961, 53 y.o.   MRN: [id number]  HPI  Patient is a 53 year old male with history of type 2 diabetes, hypertension, morbid obesity, recent nondisplaced fracture of the tuft of the left index finger status post fall 05/31/2014.   Please refer to separate problem-list charting for more details.  Review of Systems  Constitutional: Negative for fever and chills.  HENT: Negative for sore throat.   Respiratory: Negative for shortness of breath.   Cardiovascular: Negative for chest pain and palpitations.  Gastrointestinal: Negative for nausea, vomiting, abdominal pain, diarrhea, constipation and blood in stool.  Genitourinary: Negative for dysuria and hematuria.  Skin: Negative for rash.  Neurological: Negative for syncope.  Psychiatric/Behavioral: Negative for suicidal ideas.      Objective:   Physical Exam  Constitutional: He is oriented to person, place, and time. He appears well-developed and well-nourished. No distress.  HENT:  Head: Normocephalic and atraumatic.  Eyes: EOM are normal. Pupils are equal, round, and reactive to light. No scleral icterus.  Neck: Normal range of motion. Neck supple. No thyromegaly present.  Cardiovascular: Normal rate and regular rhythm.  Exam reveals no gallop and no friction rub.   No murmur heard. Pulmonary/Chest: Effort normal and breath sounds normal. No respiratory distress. He has no wheezes. He has no rales.  Abdominal: Soft. Bowel sounds are normal. He exhibits no distension. There is no tenderness. There is no rebound.  Musculoskeletal: Normal range of motion. He exhibits no edema.  Left finger with elliptical area of induration overlying the proximal interphalangeal joint with no surrounding erythema or tenderness  Neurological: He is alert and oriented to person, place, and time. No cranial nerve deficit.  Skin: No rash noted.      Assessment & Plan:  Please refer to separate  problem-list charting for more details.

## 2014-07-05 NOTE — Assessment & Plan Note (Signed)
Patient counseled on the importance of losing weight.

## 2014-07-06 NOTE — Addendum Note (Signed)
Addended by: Debe CoderMULLEN, Shanielle Correll B on: 07/06/2014 05:13 PM   Modules accepted: Level of Service

## 2014-07-06 NOTE — Progress Notes (Signed)
Internal Medicine Clinic Attending  I saw and evaluated the patient.  I personally confirmed the key portions of the history and exam documented by Dr. Ngo and I reviewed pertinent patient test results.  The assessment, diagnosis, and plan were formulated together and I agree with the documentation in the resident's note. 

## 2014-07-18 ENCOUNTER — Other Ambulatory Visit: Payer: Self-pay | Admitting: Internal Medicine

## 2014-08-24 ENCOUNTER — Other Ambulatory Visit: Payer: Self-pay | Admitting: Internal Medicine

## 2014-09-04 ENCOUNTER — Encounter (HOSPITAL_COMMUNITY): Payer: Self-pay | Admitting: Emergency Medicine

## 2014-09-04 ENCOUNTER — Emergency Department (HOSPITAL_COMMUNITY): Payer: Commercial Managed Care - PPO

## 2014-09-04 ENCOUNTER — Observation Stay (HOSPITAL_COMMUNITY)
Admission: EM | Admit: 2014-09-04 | Discharge: 2014-09-05 | Disposition: A | Discharge status: Home / Self Care | Payer: Commercial Managed Care - PPO | Attending: Internal Medicine | Admitting: Internal Medicine

## 2014-09-04 DIAGNOSIS — E119 Type 2 diabetes mellitus without complications: Secondary | ICD-10-CM | POA: Insufficient documentation

## 2014-09-04 DIAGNOSIS — G4730 Sleep apnea, unspecified: Secondary | ICD-10-CM | POA: Insufficient documentation

## 2014-09-04 DIAGNOSIS — R110 Nausea: Secondary | ICD-10-CM

## 2014-09-04 DIAGNOSIS — E785 Hyperlipidemia, unspecified: Secondary | ICD-10-CM | POA: Diagnosis present

## 2014-09-04 DIAGNOSIS — D72829 Elevated white blood cell count, unspecified: Secondary | ICD-10-CM | POA: Diagnosis not present

## 2014-09-04 DIAGNOSIS — E669 Obesity, unspecified: Secondary | ICD-10-CM | POA: Diagnosis not present

## 2014-09-04 DIAGNOSIS — R079 Chest pain, unspecified: Secondary | ICD-10-CM | POA: Diagnosis present

## 2014-09-04 DIAGNOSIS — Z7982 Long term (current) use of aspirin: Secondary | ICD-10-CM | POA: Diagnosis not present

## 2014-09-04 DIAGNOSIS — I517 Cardiomegaly: Secondary | ICD-10-CM | POA: Diagnosis not present

## 2014-09-04 DIAGNOSIS — IMO0002 Reserved for concepts with insufficient information to code with codable children: Secondary | ICD-10-CM

## 2014-09-04 DIAGNOSIS — E1165 Type 2 diabetes mellitus with hyperglycemia: Secondary | ICD-10-CM

## 2014-09-04 DIAGNOSIS — I491 Atrial premature depolarization: Secondary | ICD-10-CM | POA: Diagnosis not present

## 2014-09-04 DIAGNOSIS — I10 Essential (primary) hypertension: Secondary | ICD-10-CM | POA: Insufficient documentation

## 2014-09-04 DIAGNOSIS — Z794 Long term (current) use of insulin: Secondary | ICD-10-CM | POA: Diagnosis not present

## 2014-09-04 DIAGNOSIS — S39012A Strain of muscle, fascia and tendon of lower back, initial encounter: Secondary | ICD-10-CM

## 2014-09-04 DIAGNOSIS — I152 Hypertension secondary to endocrine disorders: Secondary | ICD-10-CM | POA: Diagnosis present

## 2014-09-04 DIAGNOSIS — M25512 Pain in left shoulder: Secondary | ICD-10-CM | POA: Insufficient documentation

## 2014-09-04 DIAGNOSIS — M5410 Radiculopathy, site unspecified: Secondary | ICD-10-CM

## 2014-09-04 DIAGNOSIS — K219 Gastro-esophageal reflux disease without esophagitis: Secondary | ICD-10-CM | POA: Diagnosis not present

## 2014-09-04 DIAGNOSIS — R0789 Other chest pain: Secondary | ICD-10-CM | POA: Diagnosis not present

## 2014-09-04 DIAGNOSIS — E1159 Type 2 diabetes mellitus with other circulatory complications: Secondary | ICD-10-CM | POA: Diagnosis present

## 2014-09-04 LAB — CBC
HCT: 48.4 % (ref 39.0–52.0)
Hemoglobin: 16 g/dL (ref 13.0–17.0)
MCH: 27.6 pg (ref 26.0–34.0)
MCHC: 33.1 g/dL (ref 30.0–36.0)
MCV: 83.4 fL (ref 78.0–100.0)
PLATELETS: 270 10*3/uL (ref 150–400)
RBC: 5.8 MIL/uL (ref 4.22–5.81)
RDW: 14.5 % (ref 11.5–15.5)
WBC: 12.4 10*3/uL — ABNORMAL HIGH (ref 4.0–10.5)

## 2014-09-04 LAB — BASIC METABOLIC PANEL
Anion gap: 7 (ref 5–15)
BUN: 9 mg/dL (ref 6–23)
CALCIUM: 8.7 mg/dL (ref 8.4–10.5)
CO2: 25 mmol/L (ref 19–32)
Chloride: 105 mmol/L (ref 96–112)
Creatinine, Ser: 0.78 mg/dL (ref 0.50–1.35)
GFR calc Af Amer: >90 mL/min (ref >90)
GLUCOSE: 99 mg/dL (ref 70–99)
Potassium: 3.9 mmol/L (ref 3.5–5.1)
Sodium: 137 mmol/L (ref 135–145)

## 2014-09-04 LAB — I-STAT TROPONIN, ED: TROPONIN I, POC: 0 ng/mL (ref 0.00–0.08)

## 2014-09-04 LAB — BRAIN NATRIURETIC PEPTIDE: B NATRIURETIC PEPTIDE 5: 37.5 pg/mL (ref 0.0–100.0)

## 2014-09-04 MED ORDER — MORPHINE SULFATE 4 MG/ML IJ SOLN
4.0000 mg | Freq: Once | INTRAMUSCULAR | Status: AC
  Administered 2014-09-04: 4 mg via INTRAVENOUS
  Filled 2014-09-04: qty 1

## 2014-09-04 MED ORDER — ASPIRIN 81 MG PO CHEW
324.0000 mg | CHEWABLE_TABLET | Freq: Once | ORAL | Status: AC
  Administered 2014-09-04: 324 mg via ORAL
  Filled 2014-09-04: qty 4

## 2014-09-04 NOTE — ED Notes (Signed)
Pt from home c/o left chest pain that radiates to back and left arm and nausea since yesterday.
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
## 2014-09-04 NOTE — ED Notes (Signed)
Unsuccessful x 2 IV attempts.

## 2014-09-04 NOTE — ED Notes (Signed)
[facility]

## 2014-09-05 ENCOUNTER — Observation Stay (HOSPITAL_COMMUNITY): Payer: Commercial Managed Care - PPO

## 2014-09-05 ENCOUNTER — Other Ambulatory Visit: Payer: Self-pay | Admitting: Physician Assistant

## 2014-09-05 DIAGNOSIS — Z7982 Long term (current) use of aspirin: Secondary | ICD-10-CM

## 2014-09-05 DIAGNOSIS — M25512 Pain in left shoulder: Secondary | ICD-10-CM | POA: Insufficient documentation

## 2014-09-05 DIAGNOSIS — I10 Essential (primary) hypertension: Secondary | ICD-10-CM

## 2014-09-05 DIAGNOSIS — K219 Gastro-esophageal reflux disease without esophagitis: Secondary | ICD-10-CM

## 2014-09-05 DIAGNOSIS — D72829 Elevated white blood cell count, unspecified: Secondary | ICD-10-CM

## 2014-09-05 DIAGNOSIS — E785 Hyperlipidemia, unspecified: Secondary | ICD-10-CM

## 2014-09-05 DIAGNOSIS — R0789 Other chest pain: Principal | ICD-10-CM

## 2014-09-05 DIAGNOSIS — E119 Type 2 diabetes mellitus without complications: Secondary | ICD-10-CM

## 2014-09-05 LAB — GLUCOSE, CAPILLARY
GLUCOSE-CAPILLARY: 127 mg/dL — AB (ref 70–99)
Glucose-Capillary: 172 mg/dL — ABNORMAL HIGH (ref 70–99)
Glucose-Capillary: 181 mg/dL — ABNORMAL HIGH (ref 70–99)
Glucose-Capillary: 111 mg/dL — ABNORMAL HIGH (ref 70–99)
Glucose-Capillary: 114 mg/dL — ABNORMAL HIGH (ref 70–99)

## 2014-09-05 LAB — TROPONIN I
Troponin I: <0.03 ng/mL (ref <0.031)
Troponin I: <0.03 ng/mL (ref <0.031)
Troponin I: <0.03 ng/mL (ref <0.031)

## 2014-09-05 LAB — CBC
HEMATOCRIT: 48.4 % (ref 39.0–52.0)
HEMOGLOBIN: 16.1 g/dL (ref 13.0–17.0)
MCH: 27.9 pg (ref 26.0–34.0)
MCHC: 33.3 g/dL (ref 30.0–36.0)
MCV: 83.7 fL (ref 78.0–100.0)
PLATELETS: 247 10*3/uL (ref 150–400)
RBC: 5.78 MIL/uL (ref 4.22–5.81)
RDW: 14.6 % (ref 11.5–15.5)
WBC: 11.2 10*3/uL — AB (ref 4.0–10.5)

## 2014-09-05 LAB — LIPID PANEL
CHOL/HDL RATIO: 3.3 ratio
Cholesterol: 122 mg/dL (ref 0–200)
HDL: 37 mg/dL — ABNORMAL LOW (ref >39)
LDL CALC: 71 mg/dL (ref 0–99)
Triglycerides: 72 mg/dL (ref <150)
VLDL: 14 mg/dL (ref 0–40)

## 2014-09-05 LAB — D-DIMER, QUANTITATIVE (NOT AT ARMC)

## 2014-09-05 MED ORDER — INSULIN ASPART 100 UNIT/ML ~~LOC~~ SOLN
0.0000 [IU] | Freq: Three times a day (TID) | SUBCUTANEOUS | Status: DC
  Administered 2014-09-05: 2 [IU] via SUBCUTANEOUS

## 2014-09-05 MED ORDER — CYCLOBENZAPRINE HCL 5 MG PO TABS
5.0000 mg | ORAL_TABLET | Freq: Three times a day (TID) | ORAL | Status: DC
  Administered 2014-09-05 (×2): 5 mg via ORAL
  Filled 2014-09-05 (×3): qty 1

## 2014-09-05 MED ORDER — METOPROLOL TARTRATE 25 MG PO TABS
25.0000 mg | ORAL_TABLET | Freq: Two times a day (BID) | ORAL | Status: DC
  Administered 2014-09-05 (×2): 25 mg via ORAL
  Filled 2014-09-05 (×3): qty 1

## 2014-09-05 MED ORDER — PANTOPRAZOLE SODIUM 40 MG PO TBEC
40.0000 mg | DELAYED_RELEASE_TABLET | Freq: Every day | ORAL | Status: DC
  Administered 2014-09-05: 40 mg via ORAL
  Filled 2014-09-05: qty 1

## 2014-09-05 MED ORDER — ACETAMINOPHEN 325 MG PO TABS
650.0000 mg | ORAL_TABLET | ORAL | Status: DC | PRN
  Administered 2014-09-05: 650 mg via ORAL
  Filled 2014-09-05: qty 2

## 2014-09-05 MED ORDER — INSULIN ASPART 100 UNIT/ML ~~LOC~~ SOLN
0.0000 [IU] | SUBCUTANEOUS | Status: DC
  Administered 2014-09-05: 3 [IU] via SUBCUTANEOUS

## 2014-09-05 MED ORDER — LISINOPRIL 2.5 MG PO TABS
2.5000 mg | ORAL_TABLET | Freq: Every day | ORAL | Status: DC
  Administered 2014-09-05: 2.5 mg via ORAL
  Filled 2014-09-05: qty 1

## 2014-09-05 MED ORDER — ASPIRIN EC 81 MG PO TBEC
81.0000 mg | DELAYED_RELEASE_TABLET | Freq: Every day | ORAL | Status: DC
  Administered 2014-09-05: 81 mg via ORAL
  Filled 2014-09-05: qty 1

## 2014-09-05 MED ORDER — ONDANSETRON HCL 4 MG/2ML IJ SOLN: 4.0000 mg | Freq: Four times a day (QID) | INTRAMUSCULAR | Status: DC | PRN

## 2014-09-05 MED ORDER — IBUPROFEN 200 MG PO TABS: 600.0000 mg | ORAL_TABLET | Freq: Three times a day (TID) | ORAL | Status: DC | PRN

## 2014-09-05 MED ORDER — INSULIN ASPART 100 UNIT/ML ~~LOC~~ SOLN: 0.0000 [IU] | Freq: Three times a day (TID) | SUBCUTANEOUS | Status: DC

## 2014-09-05 MED ORDER — HEPARIN SODIUM (PORCINE) 5000 UNIT/ML IJ SOLN
5000.0000 [IU] | Freq: Three times a day (TID) | INTRAMUSCULAR | Status: DC
  Administered 2014-09-05 (×2): 5000 [IU] via SUBCUTANEOUS
  Filled 2014-09-05 (×3): qty 1

## 2014-09-05 MED ORDER — IBUPROFEN 600 MG PO TABS
600.0000 mg | ORAL_TABLET | Freq: Three times a day (TID) | ORAL | Status: DC | PRN
  Administered 2014-09-05: 600 mg via ORAL
  Filled 2014-09-05 (×3): qty 1

## 2014-09-05 MED ORDER — SIMVASTATIN 40 MG PO TABS
40.0000 mg | ORAL_TABLET | Freq: Every evening | ORAL | Status: DC
  Filled 2014-09-05: qty 1

## 2014-09-05 MED ORDER — HYDROCHLOROTHIAZIDE 12.5 MG PO CAPS
12.5000 mg | ORAL_CAPSULE | Freq: Every day | ORAL | Status: DC
  Administered 2014-09-05: 12.5 mg via ORAL
  Filled 2014-09-05: qty 1

## 2014-09-05 NOTE — Progress Notes (Signed)
UR completed 

## 2014-09-05 NOTE — Progress Notes (Signed)
DC IV, DC Tele, DC Home. Discharge instructions and home medications discussed with patient. Patient denied any questions or concerns at this time. Patient leaving unit via wheelchair and appears in no acute distress.  

## 2014-09-05 NOTE — Progress Notes (Signed)
Subjective: No further chest pain. Still having pain in left shoulder and arm, relieved w/ elevation of the arm. No diaphoresis, nausea, vomiting, dizziness, lightheadedness, numbness, or tingling. Vitals stable.   Objective: Vital signs in last 24 hours: Filed Vitals:   09/05/14 0138 09/05/14 0142 09/05/14 0500 09/05/14 1033  BP: 123/57 138/67 128/78 130/79  Pulse: 82 76 67 74  Temp:   98.2 F (36.8 C)   TempSrc:   Oral   Resp: 18 18 19    Height:      Weight:      SpO2: 98% 98% 98%    Weight change:   Intake/Output Summary (Last 24 hours) at 09/05/14 1136 Last data filed at 09/05/14 0900  Gross per 24 hour  Intake     20 ml  Output    200 ml  Net   -180 ml   Physical Exam: General: Obese Hispanic male, alert, cooperative, NAD. HEENT: PERRL, EOMI. Moist mucus membranes Neck: Full range of motion without pain, supple, no lymphadenopathy or carotid bruits Lungs: Clear to ascultation bilaterally, normal work of respiration, no wheezes, rales, rhonchi Heart: RRR, no murmurs, gallops, or rubs Abdomen: Obese, soft, non-tender, non-distended, BS + Extremities: No cyanosis, clubbing, or edema Neurologic: Alert & oriented X3, cranial nerves II-XII intact, strength grossly intact, sensation intact to light touch   Lab Results: Basic Metabolic Panel:  Recent Labs Lab 09/04/14 1810  NA 137  K 3.9  CL 105  CO2 25  GLUCOSE 99  BUN 9  CREATININE 0.78  CALCIUM 8.7   CBC:  Recent Labs Lab 09/04/14 1810 09/05/14 0709  WBC 12.4* 11.2*  HGB 16.0 16.1  HCT 48.4 48.4  MCV 83.4 83.7  PLT 270 247   Cardiac Enzymes:  Recent Labs Lab 09/05/14 0044 09/05/14 0709  TROPONINI <0.03 <0.03   D-Dimer:  Recent Labs Lab 09/05/14 0140  DDIMER <0.27   CBG:  Recent Labs Lab 09/05/14 0144 09/05/14 0206 09/05/14 0559 09/05/14 0729  GLUCAP 181* 172* 111* 114*   Fasting Lipid Panel:  Recent Labs Lab 09/05/14 0140  CHOL 122  HDL 37*  LDLCALC 71  TRIG 72    CHOLHDL 3.3   Studies/Results: Dg Chest 2 View  09/04/2014   CLINICAL DATA:  Mid chest pain, radiating to the left posterior lower back for 2 days. Initial encounter.  EXAM: CHEST  2 VIEW  COMPARISON:  Chest radiograph performed 05/31/2014  FINDINGS: The lungs are well-aerated. Mild vascular congestion is noted. Minimal bibasilar opacities likely reflect atelectasis. There is no evidence of pleural effusion or pneumothorax.  The heart is mildly enlarged. No acute osseous abnormalities are seen.  IMPRESSION: Mild vascular congestion and mild cardiomegaly noted. Minimal bibasilar opacities likely reflect atelectasis. No displaced rib fracture seen.   Electronically Signed   By: Roanna RaiderJeffery  Chang M.D.   On: 09/04/2014 18:43   Medications: I have reviewed the patient's current medications. Scheduled Meds: . aspirin EC  81 mg Oral Daily  . cyclobenzaprine  5 mg Oral TID  . heparin  5,000 Units Subcutaneous 3 times per day  . hydrochlorothiazide  12.5 mg Oral Daily  . insulin aspart  0-15 Units Subcutaneous 6 times per day  . lisinopril  2.5 mg Oral Daily  . metoprolol tartrate  25 mg Oral BID  . pantoprazole  40 mg Oral Daily  . simvastatin  40 mg Oral QPM   Continuous Infusions:  PRN Meds:.acetaminophen, ibuprofen, ondansetron (ZOFRAN) IV   Assessment/Plan: 54 y/o M w/  PMHx of HLD, GERD, OSA, admitted for chest pain left shoulder pain.   Chest pain: No further chest pain. Troponins x3 negative. No significant EKG findings to suggest ischemic event. D-dimer negative, do not suspect PE or aortic dissection at this time. CXR wnl. This AM, having pain only in the left shoulder and arm. Based on description of pain, seems to be likely musculoskeletal. Patient denies neck pain, however, may also be related to cervical radiculopathy, as patient also admits to some issues w/ his left hand.  -XR of cervical spine, left shoulder -Ibuprofen 600 mg q8h prn for pain -Flexeril 5 mg tid -Continue ASA 81 mg  daily, simvastatin 40 mg qhs, protonix 40 mg daily -Follow up in [facility] clinic in 1-2 weeks.   Mild leukocytosis: Presenting WBC 12.4, now 11.2. Unclear etiology as otherwise SIRS negative.  -Repeat CBC in clinic  HTN: Normotensive.  -Continue home medications  DM type II: Mos recent HbA1c 6.3 on 06/20/14.  -ISS-M -Restart Metformin on discharge.   HLD: Lipid panel shows total cholesterol of 122, triglycerides 72, HDL 37, LDL 71.  -Continue Zocor 40 mg qhs  GERD: Stable.  -Continue Protonix  DVT/PE PPx: Heparin [place]  Dispo: Anticipated discharge today.   The patient does have a current PCP (Earl Lagos, MD) and does need an [facility] hospital follow-up appointment after discharge.  The patient does not have transportation limitations that hinder transportation to clinic appointments.  .Services Needed at time of discharge: Y = Yes, Blank = No PT:   OT:   RN:   Equipment:   Other:     LOS: 1 day   Courtney Paris, MD 09/05/2014, 11:36 AM

## 2014-09-05 NOTE — Progress Notes (Signed)
Subjective: Patient states he has pain over his left shoulder that is worsened by movement. He denies having neck pain or nausea/vomiting. 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 He sustained trauma to entire left side after a fall about 2-3 months ago with left index finger fracture that required closure with sutures.  Objective: Vital signs in last 24 hours: Filed Vitals:   09/05/14 0138 09/05/14 0142 09/05/14 0500 09/05/14 1033  BP: 123/57 138/67 128/78 130/79  Pulse: 82 76 67 74  Temp:   98.2 F (36.8 C)   TempSrc:   Oral   Resp: Height:      Weight:      SpO2: 98% 98% 98%    Weight change:   Intake/Output Summary (Last 24 hours) at 09/05/14 1110 Last data filed at 09/05/14 0900  Gross per 24 hour  Intake     20 ml  Output    200 ml  Net   -180 ml   Constitutional: Patient lying in bed in no acute distress and cooperative with exam Head: Normocephalic and atraumatic Nose: No erythema or drainage noted Mouth: No erythema or exudates, MMM Cardiovascular: RRR, S1 normal, S2 normal, no murmurs, rubs, or gallops. Radial pulse on L 2+ Pulmonary/Chest: normal respiratory effort, CTAB, no wheezes, rales, or rhonchi Abdominal: Soft, non-tender, non-distended, bowel sounds present, no masses, organomegaly, or guarding present Musculoskeletal: Stiff left PIP and DIP with difficulty flexing finger but hand grip full on left hand. Tender to palpation over left trapezius. No erythema. No supraspinatus tenderness with strength testing Skin: Warm, dry, and intact. No rash, cyanosis, or clubbing  Psychiatric: Normal mood and affect  Lab Results: Basic Metabolic Panel:  Recent Labs Lab 09/04/14 1810  NA 137  K 3.9  CL 105  CO2 25  GLUCOSE 99  BUN 9  CREATININE 0.78  CALCIUM 8.7   Liver Function Tests: No results for input(s): AST, ALT, ALKPHOS, BILITOT, PROT, ALBUMIN in the last 168  hours. No results for input(s): LIPASE, AMYLASE in the last 168 hours. No results for input(s): AMMONIA in the last 168 hours. CBC:  Recent Labs Lab 09/04/14 1810 09/05/14 0709  WBC 12.4* 11.2*  HGB 16.0 16.1  HCT 48.4 48.4  MCV 83.4 83.7  PLT 270 247   Cardiac Enzymes:  Recent Labs Lab 09/05/14 0044 09/05/14 0709  TROPONINI <0.03 <0.03   BNP: No results for input(s): PROBNP in the last 168 hours. D-Dimer:  Recent Labs Lab 09/05/14 0140  DDIMER <0.27   CBG:  Recent Labs Lab 09/05/14 0144 09/05/14 0206 09/05/14 0559 09/05/14 0729  GLUCAP 181* 172* 111* 114*   Hemoglobin A1C: No results for input(s): HGBA1C in the last 168 hours. Fasting Lipid Panel:  Recent Labs Lab 09/05/14 0140  CHOL 122  HDL 37*  LDLCALC 71  TRIG 72  CHOLHDL 3.3   Thyroid Function Tests: No results for input(s): TSH, T4TOTAL, FREET4, T3FREE, THYROIDAB in the last 168 hours. Coagulation: No results for input(s): LABPROT, INR in the last 168 hours. Anemia Panel: No results for input(s): VITAMINB12, FOLATE, FERRITIN, TIBC, IRON, RETICCTPCT in the last 168 hours. Urine Drug Screen: Drugs of Abuse  No results found for: LABOPIA, COCAINSCRNUR, LABBENZ, AMPHETMU, THCU, LABBARB  Alcohol Level: No results for input(s): ETH in the last 168 hours. Urinalysis: No results for input(s): COLORURINE, LABSPEC, PHURINE, GLUCOSEU, HGBUR, BILIRUBINUR, KETONESUR, PROTEINUR, UROBILINOGEN,  NITRITE, LEUKOCYTESUR in the last 168 hours.  Invalid input(s): APPERANCEUR  Micro Results: No results found for this or any previous visit (from the past 240 hour(s)). Studies/Results: Dg Chest 2 View  09/04/2014   CLINICAL DATA:  Mid chest pain, radiating to the left posterior lower back for 2 days. Initial encounter.  EXAM: CHEST  2 VIEW  COMPARISON:  Chest radiograph performed 05/31/2014  FINDINGS: The lungs are well-aerated. Mild vascular congestion is noted. Minimal bibasilar opacities likely reflect  atelectasis. There is no evidence of pleural effusion or pneumothorax.  The heart is mildly enlarged. No acute osseous abnormalities are seen.  IMPRESSION: Mild vascular congestion and mild cardiomegaly noted. Minimal bibasilar opacities likely reflect atelectasis. No displaced rib fracture seen.   Electronically Signed   By: Roanna Raider M.D.   On: 09/04/2014 18:43   Medications:  Scheduled Meds: . aspirin EC  81 mg Oral Daily  . cyclobenzaprine  5 mg Oral TID  . heparin  5,000 Units Subcutaneous 3 times per day  . hydrochlorothiazide  12.5 mg Oral Daily  . insulin aspart  0-15 Units Subcutaneous 6 times per day  . lisinopril  2.5 mg Oral Daily  . metoprolol tartrate  25 mg Oral BID  . pantoprazole  40 mg Oral Daily  . simvastatin  40 mg Oral QPM   Continuous Infusions:  PRN Meds:.acetaminophen, ibuprofen, ondansetron (ZOFRAN) IV  Assessment/Plan: Principal Problem:   Chest pain Active Problems:   Hyperlipidemia   Morbid obesity   Essential hypertension   GERD   Type 2 diabetes mellitus, controlled   Pain in the chest   Brian Reilly is a 54 y.o. male with PMH of HTN, DM2, HL, GERD who presented to the [facility] with left sided arm and chest pain that is most likely musculoskeletal in nature.  #Left Arm/Chest pain: ACS seems unlikely with initial negative troponin and EKG unchanged from prior in 2013. He has a TIMI score of 2 which is 8% risk at 14 days of all cause mortality, new or recurrent MI, or severe recurrent ischemia requiring urgent revascularization. Risk factors include age, HTN, DM2, HL. PE low risk with Wells score of 0 and GENEVA of 3. Aortic dissection is on differential with him describing radiation to his back but CXR did not have widened mediastinum, no hypotension or syncope, no murmur. However, L and R BP were 147/90 and 129/70, respectively. Will check d-dimer as this has 96% negative predictive value. He has been afebrile with no consolidation on CXR making PNA  unlikely although he does have mild leukocytosis 12.4. While CXR noted mild cardiomegaly, his BNP was 37.5 making heart failure unlikely. This may be musculoskeletal as he had some tenderness on palpation of L scapula and says some arm movements relieve the arm pain. GERD is possible as he is on protonix 40 mg daily but he says this is unlike his reflux. -D-dimer <0.27 -Troponin x 3 q6h <0.03 -cont ASA 81 mg daily, simvastatin 40 mg qhs, protonix 40 mg daily -zofran 4 mg iv q6hprn, acetaminophen 650 mg po q4hprn -cardiac monitoring -Start flexeril 5 mg three times daily -Start Ibuprofen 600 mg every 8 hours as needed for moderate pain -Follow up cervical spine and left shoulder x-rays  #Mild leukocytosis: Presenting WBC 12.4. Unclear etiology as otherwise SIRS negative. His only complaint is the chest, arm, and back pain and he has no consolidation on CXR. It could be reactive from an inflammation process. -CBC on 1/25 showed WBC 11.2  trending down from 12.4 on admission  #HTN: Mr Wallington's BP has been 120s-140s/70s-90s. At home he is on ASA 81 mg daily, HCTZ 12.5 mg daily, lisinopril 2.5 mg daily, metoprolol 25 mg bid, simvastatin 40 mg qhs. -Continue ASA 81 mg daily, HCTZ 12.5 mg daily, lisinopril 2.5 mg daily, metoprolol 25 mg bid, simvastatin 40 mg qhs.  #DM2: Presenting blood glucose of 99. Last hemoglobin A1c was 6.3 on 06/20/14. He was 11.7 on 09/2012. At home he takes metformin 850 mg bid. -Continue SSI -Hold home metformin  #HLD: Last lipid panel 09/23/13 with cholesterol 171, triglyceride 146, HDL 42, LDL 100. He is on simvastatin 40 mg qhs. -On 1/25 lipid panel: HDL 37 -Continue simvastatin 40 mg qhs  #GERD: At home he takes protonix 40 mg daily. -Continue protonix 40 mg daily  #Diet: Heart-healthy  #DVT PPx: heparin 5000 u [place] tid  #Code: full  Dispo: Disposition is deferred at this time, awaiting improvement of current medical problems. Anticipated discharge in approximately  1-2 day(s).   The patient does have a current PCP (Earl LagosNischal Narendra, MD) and [facility].  The patient does not have transportation limitations that hinder transportation to clinic appointments.   Services Needed at time of discharge: Y = Yes, Blank = No PT:   OT:   RN:   Equipment:   Other:      LOS: 1 day   This is a Psychologist, occupationalMedical Student Note. The care of the patient was discussed with Dr. Yetta BarreJones and the assessment and plan was formulated with their assistance. Please see their note for official documentation of the patient encounter.  Signed: Chiquita LothLina Carballo Jarely Juncaj, Med Student Internal Medicine Teaching Service Team B2  [phone] 09/05/2014, 11:10 AM

## 2014-09-05 NOTE — Discharge Instructions (Signed)
1. You have a follow up appointment scheduled as follows:  [facility]  On 09/13/2014 @ 8:15 AM  [address] [place] [address] [phone]  2. Please take all medications as prescribed.   Take Ibuprofen 600 mg every 8 hours as needed for shoulder pain.   Also take Flexeril 5 mg three times daily for muscle pain.  3. If you have worsening of your symptoms or new symptoms arise, please call the clinic ([phone]), or go to the ER immediately if symptoms are severe.    Chest Pain (Nonspecific) It is often hard to give a specific diagnosis for the cause of chest pain. There is always a chance that your pain could be related to something serious, such as a heart attack or a blood clot in the lungs. You need to follow up with your health care provider for further evaluation. CAUSES   Heartburn.  Pneumonia or bronchitis.  Anxiety or stress.  Inflammation around your heart (pericarditis) or lung (pleuritis or pleurisy).  A blood clot in the lung.  A collapsed lung (pneumothorax). It can develop suddenly on its own (spontaneous pneumothorax) or from trauma to the chest.  Shingles infection (herpes zoster virus). The chest wall is composed of bones, muscles, and cartilage. Any of these can be the source of the pain.  The bones can be bruised by injury.  The muscles or cartilage can be strained by coughing or overwork.  The cartilage can be affected by inflammation and become sore (costochondritis). DIAGNOSIS  Lab tests or other studies may be needed to find the cause of your pain. Your health care provider may have you take a test called an ambulatory electrocardiogram (ECG). An ECG records your heartbeat patterns over a 24-hour period. You may also have other tests, such as:  Transthoracic echocardiogram (TTE). During echocardiography, sound waves are used to evaluate how blood flows through your heart.  Transesophageal echocardiogram  (TEE).  Cardiac monitoring. This allows your health care provider to monitor your heart rate and rhythm in real time.  Holter monitor. This is a portable device that records your heartbeat and can help diagnose heart arrhythmias. It allows your health care provider to track your heart activity for several days, if needed.  Stress tests by exercise or by giving medicine that makes the heart beat faster. TREATMENT   Treatment depends on what may be causing your chest pain. Treatment may include:  Acid blockers for heartburn.  Anti-inflammatory medicine.  Pain medicine for inflammatory conditions.  Antibiotics if an infection is present.  You may be advised to change lifestyle habits. This includes stopping smoking and avoiding alcohol, caffeine, and chocolate.  You may be advised to keep your head raised (elevated) when sleeping. This reduces the chance of acid going backward from your stomach into your esophagus. Most of the time, nonspecific chest pain will improve within 2-3 days with rest and mild pain medicine.  HOME CARE INSTRUCTIONS   If antibiotics were prescribed, take them as directed. Finish them even if you start to feel better.  For the next few days, avoid physical activities that bring on chest pain. Continue physical activities as directed.  Do not use any tobacco products, including cigarettes, chewing tobacco, or electronic cigarettes.  Avoid drinking alcohol.  Only take medicine as directed by your health care provider.  Follow your health care provider's suggestions for further testing if your chest pain does not go away.  Keep any follow-up appointments you  made. If you do not go to an appointment, you could develop lasting (chronic) problems with pain. If there is any problem keeping an appointment, call to reschedule. SEEK MEDICAL CARE IF:   Your chest pain does not go away, even after treatment.  You have a rash with blisters on your chest.  You have  a fever. SEEK IMMEDIATE MEDICAL CARE IF:   You have increased chest pain or pain that spreads to your arm, neck, jaw, back, or abdomen.  You have shortness of breath.  You have an increasing cough, or you cough up blood.  You have severe back or abdominal pain.  You feel nauseous or vomit.  You have severe weakness.  You faint.  You have chills. This is an emergency. Do not wait to see if the pain will go away. Get medical help at once. Call your local emergency services (911 in [place] Do not drive yourself to the hospital. MAKE SURE YOU:   Understand these instructions.  Will watch your condition.  Will get help right away if you are not doing well or get worse. Document Released: 05/08/2005 Document Revised: 08/03/2013 Document Reviewed: 03/03/2008 [facility] Patient Information 2015 [place]. This information is not intended to replace advice given to you by your health care provider. Make sure you discuss any questions you have with your health care provider.
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
## 2014-09-08 ENCOUNTER — Other Ambulatory Visit: Payer: Self-pay | Admitting: Internal Medicine

## 2014-09-08 ENCOUNTER — Other Ambulatory Visit: Payer: Self-pay | Admitting: *Deleted

## 2014-09-08 DIAGNOSIS — R079 Chest pain, unspecified: Secondary | ICD-10-CM

## 2014-09-08 DIAGNOSIS — S39012A Strain of muscle, fascia and tendon of lower back, initial encounter: Secondary | ICD-10-CM

## 2014-09-08 MED ORDER — CYCLOBENZAPRINE HCL 5 MG PO TABS: 5.0000 mg | ORAL_TABLET | Freq: Three times a day (TID) | ORAL | Status: DC | PRN

## 2014-09-12 ENCOUNTER — Encounter: Payer: Self-pay | Admitting: Internal Medicine

## 2014-09-12 ENCOUNTER — Ambulatory Visit: Payer: Commercial Managed Care - PPO | Admitting: Internal Medicine

## 2014-09-12 ENCOUNTER — Ambulatory Visit (INDEPENDENT_AMBULATORY_CARE_PROVIDER_SITE_OTHER): Payer: Commercial Managed Care - PPO | Admitting: Internal Medicine

## 2014-09-12 VITALS — BP 116/82 | HR 66 | Temp 98.2°F | Wt 351.8 lb

## 2014-09-12 DIAGNOSIS — M7582 Other shoulder lesions, left shoulder: Secondary | ICD-10-CM

## 2014-09-12 DIAGNOSIS — E785 Hyperlipidemia, unspecified: Secondary | ICD-10-CM

## 2014-09-12 DIAGNOSIS — E119 Type 2 diabetes mellitus without complications: Secondary | ICD-10-CM

## 2014-09-12 MED ORDER — MELOXICAM 7.5 MG PO TABS: 7.5000 mg | ORAL_TABLET | Freq: Every day | ORAL | Status: DC

## 2014-09-12 MED ORDER — SIMVASTATIN 40 MG PO TABS: 40.0000 mg | ORAL_TABLET | Freq: Every evening | ORAL | Status: DC

## 2014-09-12 NOTE — Patient Instructions (Addendum)
General Instructions:   Thank you for bringing your medicines today. This helps us keep you safe from mistakes.

## 2014-09-13 ENCOUNTER — Ambulatory Visit: Payer: Commercial Managed Care - PPO | Admitting: Internal Medicine

## 2014-09-15 NOTE — Assessment & Plan Note (Signed)
Refilled Zocor today 

## 2014-09-15 NOTE — Progress Notes (Signed)
Internal Medicine Clinic Attending  I saw and evaluated the patient.  I personally confirmed the key portions of the history and exam documented by Dr. Patel and I reviewed pertinent patient test results.  The assessment, diagnosis, and plan were formulated together and I agree with the documentation in the resident's note.  

## 2014-09-15 NOTE — Progress Notes (Signed)
   Subjective:    Patient ID: Brian Reilly, male    DOB: 01/17/1961, 54 y.o.   MRN: [id number]  HPI Brian Reilly is a 54 year old male with DM2, HTN, morbid obesity who presents for hospital follow-up. Please see assessment & plan for documentation of each problem.   Review of Systems  Constitutional: Negative for fever.  Respiratory: Negative for shortness of breath.   Cardiovascular: Negative for chest pain.  Gastrointestinal: Negative for nausea, vomiting, abdominal pain and diarrhea.  Musculoskeletal: Positive for myalgias and neck pain.       L shoulder pain  Neurological: Negative for dizziness.       Objective:   Physical Exam Constitutional: He is oriented to person, place, and time. He appears well-developed and well-nourished. No distress.  HENT:  Head: Normocephalic and atraumatic.  Eyes: Conjunctivae are normal. Pupils are equal, round, and reactive to light.  Cardiovascular: Normal rate, regular rhythm and normal heart sounds.  Exam reveals no gallop and no friction rub.   No murmur heard. Pulmonary/Chest: Effort normal. No respiratory distress. He has no wheezes. He has no rales.  Shoulder: No pain with passive range of motion, abduction/adduction of L shoulder. Mild tenderness to deep palpation of L trapezius muscle. Abdominal: Soft. Bowel sounds are normal. He exhibits no distension. There is no tenderness.  Neurological: He is alert and oriented to person, place, and time. No cranial nerve deficit. Coordination normal.  Skin: Skin is warm and dry. He is not diaphoretic.  Psychiatric: His behavior is normal.          Assessment & Plan:

## 2014-09-15 NOTE — Assessment & Plan Note (Signed)
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  Assessment -Etiology appears more likely to be muscular, like tendinitis -Full range of motion on exam is reassuring against rotator cuff tear or adhesive capsulitis -Absence of numbness/tingling is reassuring against nerve involvement  Plan -Continue Flexeril but advise not to take prior to operating machinery or driving -Discontinue ibuprofen for meloxicam -Refer to Sports Management -Follow-up in 1 week

## 2014-09-19 ENCOUNTER — Ambulatory Visit (INDEPENDENT_AMBULATORY_CARE_PROVIDER_SITE_OTHER): Payer: Commercial Managed Care - PPO | Admitting: Internal Medicine

## 2014-09-19 ENCOUNTER — Encounter: Payer: Self-pay | Admitting: Internal Medicine

## 2014-09-19 VITALS — BP 137/83 | HR 72 | Temp 98.2°F | Wt 359.5 lb

## 2014-09-19 DIAGNOSIS — M7582 Other shoulder lesions, left shoulder: Secondary | ICD-10-CM

## 2014-09-19 DIAGNOSIS — S62661D Nondisplaced fracture of distal phalanx of left index finger, subsequent encounter for fracture with routine healing: Secondary | ICD-10-CM

## 2014-09-19 NOTE — Assessment & Plan Note (Addendum)
Overview -Reports that he has intermittent discomfort with his finger worse with hyperflexion that does not bother him when he exerts himself doing day-to-day tasks -Reports having some trauma here about 3 months ago after he sustained a fall -Denies any numbness or tingling associated with the discomfort -No pain on palpation of the affected segments  Assessment -Physical exam findings consistent with increased swelling overlying the segment just between MCP and PIP -Unsure why he may continue to be having pain in this area  Plan -Refer to sports medicine for further evaluation

## 2014-09-19 NOTE — Assessment & Plan Note (Addendum)
Overview -He reports the pain is markedly improved from last week and is now just a minor nuisance.  Assessment -Likely musculoskeletal as noted before, like tendinitis  Plan -Continue current regimen -Also explained to patient that Mobic is not a good long-term therapy and that if he needs adequate relief of symptoms that he may need to follow-up with sports medicine

## 2014-09-19 NOTE — Progress Notes (Signed)
   Subjective:    Patient ID: Brian Reilly, male    DOB: 07/21/1961, 54 y.o.   MRN: [id number]  HPI Brian Reilly is a 54 year old male with DM2, HTN, morbid obesity who presents for hospital follow-up. Please see assessment & plan for documentation of each problem.   Review of Systems  Constitutional: Negative for fever.  Respiratory: Negative for shortness of breath.   Cardiovascular: Negative for chest pain.  Gastrointestinal: Negative for nausea, vomiting, abdominal pain and diarrhea.  Neurological: Negative for dizziness.       Objective:   Physical Exam Constitutional: He is oriented to person, place, and time. He appears well-developed and well-nourished. No distress.  HENT:  Head: Normocephalic and atraumatic.  Eyes: Conjunctivae are normal. Pupils are equal, round, and reactive to light.  Cardiovascular: Normal rate, regular rhythm and normal heart sounds.  Exam reveals no gallop and no friction rub.   No murmur heard. Pulmonary/Chest: Effort normal. No respiratory distress. He has no wheezes. He has no rales.  Abdominal: Soft. Bowel sounds are normal. He exhibits no distension. There is no tenderness.  Neurological: He is alert and oriented to person, place, and time. No cranial nerve deficit. Coordination normal.  Skin: Skin is warm and dry. He is not diaphoretic.  MSK: L first finger with some swelling noted between the first MCP and PIP. No pain elicited with palpation of this segment. No pain elicited with range of motion.  Psychiatric: His behavior is normal.          Assessment & Plan:

## 2014-09-19 NOTE — Patient Instructions (Addendum)
Please try to bring all your medicines next time. This will help us keep you safe from mistakes.  Will call back for appointment with sports medicine.

## 2014-09-20 NOTE — Progress Notes (Signed)
INTERNAL MEDICINE TEACHING ATTENDING ADDENDUM - Jordynn Marcella, MD: I reviewed and discussed at the time of visit with the resident Dr. Patel, the patient's medical history, physical examination, diagnosis and results of pertinent tests and treatment and I agree with the patient's care as documented.  

## 2014-09-20 NOTE — Discharge Summary (Signed)
Name: Brian Reilly MRN: [id number] DOB: 12-May-1961 54 y.o. PCP: Brian Contes, MD  Date of Admission: 09/04/2014  5:34 PM Date of Discharge: 09/05/2014 Attending Physician: Dr. Aldine Contes, MD  Discharge Diagnosis: 1. Chest pain  Discharge Medications:   Medication List    STOP taking these medications        cephALEXin 500 MG capsule  Commonly known as:  KEFLEX     cyclobenzaprine 5 MG tablet  Commonly known as:  FLEXERIL     meloxicam 15 MG tablet  Commonly known as:  MOBIC     simvastatin 40 MG tablet  Commonly known as:  ZOCOR     traMADol-acetaminophen 37.5-325 MG per tablet  Commonly known as:  ULTRACET      TAKE these medications        aspirin 81 MG EC tablet  Take 1 tablet (81 mg total) by mouth daily.     glucose blood test strip  Commonly known as:  ONE TOUCH ULTRA TEST  Check blood sugar every morning before you eat or drink anything dx code 250.00     hydrochlorothiazide 12.5 MG capsule  Commonly known as:  MICROZIDE  Take 1 capsule (12.5 mg total) by mouth daily.     ibuprofen 200 MG tablet  Commonly known as:  ADVIL,MOTRIN  Take 3 tablets (600 mg total) by mouth every 8 (eight) hours as needed for moderate pain.     lisinopril 2.5 MG tablet  Commonly known as:  PRINIVIL,ZESTRIL  TAKE ONE TABLET BY MOUTH ONCE DAILY     metFORMIN 850 MG tablet  Commonly known as:  GLUCOPHAGE  TAKE ONE TABLET BY MOUTH TWICE DAILY WITH MEALS     metoprolol tartrate 25 MG tablet  Commonly known as:  LOPRESSOR  Take 1 tablet (25 mg total) by mouth 2 (two) times daily.     nitroGLYCERIN 0.4 MG SL tablet  Commonly known as:  NITROSTAT  Use one under tongue every 5 minutes as needed for Chest pain, no more than 3 times. If continued chest pain, call 911.     ONE TOUCH ULTRA MINI W/DEVICE Kit  Check blood sugar every morning before you eat or drink anything dx code 175.10     ONETOUCH DELICA LANCETS FINE Misc  1 each by Does not apply route daily  before breakfast. Dx code 250.00     pantoprazole 40 MG tablet  Commonly known as:  PROTONIX  Take 1 tablet (40 mg total) by mouth daily.        Disposition and follow-up:   Brian Reilly was discharged from [facility] in Good condition.  At the hospital follow up visit please address:  1.  Chest pain; most likely not cardiac etiology, workup negative. Suspect MSK vs cervical radiculopathy. Still w/ chest pain? Shoulder pain? Relief w/ NSAIDS, Flexeril?  2.  Labs / imaging needed at time of follow-up: none  3.  Pending labs/ test needing follow-up: none  Follow-up Appointments:     Follow-up Information    Follow up with [place] On 09/13/2014.   Why:  8:15 AM   Contact information:   [address] [place] [phone]       Procedures Performed:  Dg Chest 2 View  09/04/2014   CLINICAL DATA:  Mid chest pain, radiating to the left posterior lower back for 2 days. Initial encounter.  EXAM: CHEST  2 VIEW  COMPARISON:  Chest radiograph  performed 05/31/2014  FINDINGS: The lungs are well-aerated. Mild vascular congestion is noted. Minimal bibasilar opacities likely reflect atelectasis. There is no evidence of pleural effusion or pneumothorax.  The heart is mildly enlarged. No acute osseous abnormalities are seen.  IMPRESSION: Mild vascular congestion and mild cardiomegaly noted. Minimal bibasilar opacities likely reflect atelectasis. No displaced rib fracture seen.   Electronically Signed   By: Brian Reilly M.D.   On: 09/04/2014 18:43   Dg Cervical Spine Complete  09/05/2014   CLINICAL DATA:  Radiculopathy. No injury. Neck pain radiating down the LEFT shoulder.  EXAM: CERVICAL SPINE  4+ VIEWS  COMPARISON:  None.  FINDINGS: This study is technically degraded by obese body habitus.  On the lateral view of the cervical spine, C1 through C6 is visible and there is no fracture identified. Mid to lower  mild-to-moderate cervical spondylosis is present. The prevertebral soft tissues appear within normal limits. The oblique images are technically suboptimal when evaluating the RIGHT neural foramina. The LEFT neural foramina appear adequately patent. Odontoid appears within normal limits.  Swimmer's view is submitted for interpretation and the cervicothoracic junction appears within normal limits. The alignment appears within normal limits on frontal and lateral views.  IMPRESSION: Mild to moderate mid to lower cervical spondylosis. No acute osseous abnormality.   Electronically Signed   By: Brian Reilly M.D.   On: 09/05/2014 14:29   Dg Shoulder Left  09/05/2014   CLINICAL DATA:  Radiculopathy affecting the upper extremity.  EXAM: LEFT SHOULDER - 2+ VIEW  COMPARISON:  Chest radiograph 09/04/2014  FINDINGS: Small calcifications above the superior left humerus. The left shoulder is located without a fracture. Limited evaluation of the left lung. The left AC joint is intact.  IMPRESSION: No acute bone abnormality to the left shoulder.  Small calcifications in the region of the rotator cuff may represent mild calcific tendinitis.   Electronically Signed   By: Brian Reilly M.D.   On: 09/05/2014 14:26     Admission HPI: Mr Brian Reilly is a 54 year old man with HTN, DM2, HL, GERD who presented to the [facility] with left sided chest pain. 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 It is left sided and radiates to his back and left arm with one episode of associated nausea without emesis. He said the back pain is constant but the chest and arm component comes and goes. It is not related to exertion or breathing. Morphine made it feel better. He says the arm pain is positional. He has never had this pain before and says it is unlike his GERD. He denies any trauma, SOB, diaphoresis, palpitations, fevers, chills, cough, abdominal pain, diarrhea, constipation.  In the [facility], he received ASA 324 mg  daily, morphine 4 mg iv x 2.  Hospital Course by problem list:  1. Chest pain: Troponins x3 negative. No significant EKG findings to suggest ischemic event. D-dimer negative, do not suspect PE or aortic dissection at this time. CXR wnl. Day of discharge, having pain only in the left shoulder and arm. Based on description of pain, seems to be likely musculoskeletal. Patient denies neck pain, however, may also be related to cervical radiculopathy, as patient also admits to some issues w/ his left hand. XR of left shoulder showed no acute bone abnormality to the left shoulder. Did suggest Small calcifications in the region of the rotator cuff may represent mild calcific tendinitis. XR cervical spine showed mild to moderate lower  cervical spondylosis. Sent home w/ Ibuprofen 600 mg q8h prn + Flexeril 5 mg tid. Also continued ASA, Zocor, and Protonix.   Discharge Vitals:   BP 130/79 mmHg  Pulse 74  Temp(Src) 98.2 F (36.8 C) (Oral)  Resp 19  Ht 6' 3" (1.905 m)  Wt 344 lb 4.8 oz (156.173 kg)  BMI 43.03 kg/m2  SpO2 98%  Discharge Labs:  No results found for this or any previous visit (from the past 24 hour(s)).  Signed: Corky Sox, MD 09/20/2014, 12:09 PM    Services Ordered on Discharge: None Equipment Ordered on Discharge: None

## 2014-10-05 ENCOUNTER — Other Ambulatory Visit: Payer: Self-pay | Admitting: Dietician

## 2014-10-05 DIAGNOSIS — E119 Type 2 diabetes mellitus without complications: Secondary | ICD-10-CM

## 2014-10-05 NOTE — Progress Notes (Signed)
Referral for annual Diabetes Self Management Training and support requested.

## 2014-10-07 ENCOUNTER — Encounter: Payer: Self-pay | Admitting: Family Medicine

## 2014-10-07 ENCOUNTER — Ambulatory Visit (INDEPENDENT_AMBULATORY_CARE_PROVIDER_SITE_OTHER): Payer: Commercial Managed Care - PPO | Admitting: Family Medicine

## 2014-10-07 VITALS — BP 153/89 | HR 87 | Ht 74.0 in | Wt 350.0 lb

## 2014-10-07 DIAGNOSIS — S62661D Nondisplaced fracture of distal phalanx of left index finger, subsequent encounter for fracture with routine healing: Secondary | ICD-10-CM | POA: Diagnosis not present

## 2014-10-07 DIAGNOSIS — M25512 Pain in left shoulder: Secondary | ICD-10-CM

## 2014-10-07 DIAGNOSIS — M720 Palmar fascial fibromatosis [Dupuytren]: Secondary | ICD-10-CM | POA: Diagnosis not present

## 2014-10-07 MED ORDER — METHYLPREDNISOLONE ACETATE 40 MG/ML IJ SUSP
40.0000 mg | Freq: Once | INTRAMUSCULAR | Status: AC
  Administered 2014-10-07: 40 mg via INTRA_ARTICULAR

## 2014-10-07 NOTE — Patient Instructions (Addendum)
  Su dolor de hombro es un musculo llamado el 'rhomboid.' Probablamente tiene dolor porque esta trabajando mas que el otro (compensando) por anos, entoces este es un estrain. - Hemos injectadolo hoy. - Relaja un poco hoy y [place] hielo este tarde en este lugar para calmar lo. - Regresa inmediatamente si tiene mucha hinchazon, sangre, pus, o fiebre. - Haga los ejercisios 4-5 veces a la semana. - Regresa en 2-3 meses para re-evaluar.  Su dolor de dedo es porque tiene "scar tissue" y esta rigido. Tiene que continua haciendo ejercisios para flexibilidad en la casa. Tambien, tiene una "scar tissue" en su mano llamado un "contracture."  Hemos injectadolo hoy. Tambien, haga ejercisios con "tennis ball" diario.

## 2014-10-07 NOTE — Assessment & Plan Note (Signed)
Left second digit pain is likely due to scarring from laceration causing stiffness. Of note, he also has a Dupuytren's contracture that seems to arise from the flexor tendons of the third digit -Dupuytrens contracture steroid injection per procedure note -Discussed tennis ball squeeze exercises to maintain strength and increase flexibility of hand -follow-up 2 months
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
## 2014-10-07 NOTE — Assessment & Plan Note (Addendum)
Most likely a rhomboid strain given physical exam findings. Unlikely to be a rotator cuff issue given no deficits on rotator cuff exam. 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 Years of compensation and heavy use of left arm with his job likely strained the rhomboid. -IM Steroid injection of rhomboid per procedure note -Postural improvement and scapular stabilization exercises reviewed and discussed performing these 4-5 times per week. -Follow-up in 2 months.

## 2014-10-09 NOTE — Progress Notes (Signed)
[facility] Attending Note: I have seen and examined this patient. I have discussed this patient with the resident and reviewed the assessment and plan as documented above. I agree with the resident's findings and plan.  

## 2014-11-02 ENCOUNTER — Encounter: Payer: Commercial Managed Care - PPO | Admitting: Dietician

## 2014-11-02 NOTE — Addendum Note (Signed)
Addended by: Neomia DearPOWERS, Leam Madero E on: 11/02/2014 07:35 PM   Modules accepted: Orders

## 2014-12-09 ENCOUNTER — Ambulatory Visit: Payer: Commercial Managed Care - PPO | Admitting: Family Medicine

## 2015-01-04 ENCOUNTER — Other Ambulatory Visit: Payer: Self-pay | Admitting: Internal Medicine

## 2015-01-04 DIAGNOSIS — I10 Essential (primary) hypertension: Secondary | ICD-10-CM

## 2015-01-30 ENCOUNTER — Ambulatory Visit (INDEPENDENT_AMBULATORY_CARE_PROVIDER_SITE_OTHER): Payer: Commercial Managed Care - PPO | Admitting: Family Medicine

## 2015-01-30 ENCOUNTER — Ambulatory Visit (INDEPENDENT_AMBULATORY_CARE_PROVIDER_SITE_OTHER): Payer: Commercial Managed Care - PPO

## 2015-01-30 VITALS — BP 112/90 | HR 83 | Temp 99.0°F | Resp 18 | Ht 73.75 in | Wt 349.1 lb

## 2015-01-30 DIAGNOSIS — M5441 Lumbago with sciatica, right side: Secondary | ICD-10-CM

## 2015-01-30 DIAGNOSIS — E119 Type 2 diabetes mellitus without complications: Secondary | ICD-10-CM

## 2015-01-30 LAB — POCT CBC
Granulocyte percent: 67.6 %G (ref 37–80)
HEMATOCRIT: 46.9 % (ref 43.5–53.7)
HEMOGLOBIN: 15.2 g/dL (ref 14.1–18.1)
Lymph, poc: 3.4 (ref 0.6–3.4)
MCH: 26.7 pg — AB (ref 27–31.2)
MCHC: 32.3 g/dL (ref 31.8–35.4)
MCV: 82.7 fL (ref 80–97)
MID (cbc): 0.9 (ref 0–0.9)
MPV: 8.9 fL (ref 0–99.8)
POC Granulocyte: 8.9 — AB (ref 2–6.9)
POC LYMPH PERCENT: 25.8 %L (ref 10–50)
POC MID %: 6.6 % (ref 0–12)
Platelet Count, POC: 277 10*3/uL (ref 142–424)
RBC: 5.67 M/uL (ref 4.69–6.13)
RDW, POC: 14.9 %
WBC: 13.1 10*3/uL — AB (ref 4.6–10.2)

## 2015-01-30 LAB — POCT GLYCOSYLATED HEMOGLOBIN (HGB A1C): Hemoglobin A1C: 6.7

## 2015-01-30 LAB — GLUCOSE, POCT (MANUAL RESULT ENTRY): POC Glucose: 184 mg/dl — AB (ref 70–99)

## 2015-01-30 LAB — POCT SEDIMENTATION RATE: POCT SED RATE: 14 mm/hr (ref 0–22)

## 2015-01-30 MED ORDER — CYCLOBENZAPRINE HCL 10 MG PO TABS: 10.0000 mg | ORAL_TABLET | Freq: Three times a day (TID) | ORAL | Status: DC | PRN

## 2015-01-30 MED ORDER — METFORMIN HCL 850 MG PO TABS: 850.0000 mg | ORAL_TABLET | Freq: Two times a day (BID) | ORAL | Status: DC

## 2015-01-30 MED ORDER — INDOMETHACIN 50 MG PO CAPS: 50.0000 mg | ORAL_CAPSULE | Freq: Two times a day (BID) | ORAL | Status: DC

## 2015-01-30 MED ORDER — TRAMADOL HCL 50 MG PO TABS: 50.0000 mg | ORAL_TABLET | Freq: Three times a day (TID) | ORAL | Status: DC | PRN
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
## 2015-01-30 NOTE — Patient Instructions (Addendum)
Do not use the indomethacin with any other otc pain medication other than tylenol/acetaminophen - so no aleve, ibuprofen, motrin, advil, etc. If you are still having pain in 1 month you should come back - we could do a steroid shot, or refer you to PT or see if you need an MRI and to see a neurosurgeon which will be the case if the pain keeps going down your leg and makes your leg week or numb. Heat for 20 minutes twice a day and stretch after. Make sure to keep a pillow behind your low back and consider using a large foam roller to lie on (you can find these is the exercise section at [employer] - next to the handweights.   Distensin de la cintura con rehabilitacin (Low Back Strain with Rehab) [name] distensin es una lesin en la que el tendn o msculo se rasga. Los msculos y tendones de la cintura son susceptibles de sufrir distensiones. Sin embargo, estos msculos y tendones son Lynnae Sandhoff fuertes y se requiere de una gran fuerza para lesionarlos. Los esguinces se clasifican en tres categoras. En el esguince de grado 1 hay dolor, pero el tendn no est estirado. En los esguinces de grado 2 hay un ligamento alargado, debido a un estiramiento o desgarro parcial. En el esguince de grado 2 an hay funcionamiento, aunque ste puede disminuir. Una distensin en grado 3 es la ruptura completa del msculo o el tendn, y suele quedar incapacitada la funcin. SNTOMAS  Dolor en la cintura.  Dolor en la espalda, que a menudo afecta ms a un lado que al CIGNA.  Dolor que empeora con los movimientos y se siente en las caderas, las nalgas o la zona posterior del muslo.  Espasmos en los msculos de la espalda.  Hinchazn a lo largo de los msculos de la espalda.  Prdida de la fuerza en estos msculos.  Ruido de "crack" (crepitacin) al tocarlos. CAUSAS  La distensin se produce cuando se coloca una fuerza en el msculo o tendn que es mayor de lo que puede soportar. Las causas ms frecuentes de la  lesin son:  Benedetto Goad prolongado de la unidad msculo - tendn en la zona de la cintura, generalmente debido a Landscape architect.  Una nica lesin o fuerza violenta aplicada en la espalda. LOS RIESGOS AUMENTAN CON  Cualquier deporte en el que el movimiento ocasione una fuerza de giro en la espalda o una gran inclinacin de la cintura; (ftbol americano, rugby, levantamiento de pesas, bowling, golf, tenis, patinaje, deportes con raqueta, natacin, carrera, gimnasia, clavados).  Poca fuerza y flexibilidad.  No hacer un precalentamiento adecuado.  Historia familiar de dolor de cintura o trastornos en discos.  Cirugas previas en la espalda (especialmente fusin).  Tcnica incorrecta al levantar objetos pesados.  Permanecer largo tiempo sentado, especialmente de manera incorrecta. MEDIDAS DE PREVENCIN  Aprenda y Child psychotherapist correcta al sentarse o al levantarse (mantener la postura correcta al sentarse; levantar objetos inclinando las rodillas y las piernas y no la cintura).  Precalentamiento adecuado y elongacin antes de la [place].  Descanso y recuperacin entre actividades.  Mantener la forma fsica:  Earma [place], flexibilidad y resistencia muscular.  Capacidad cardiovascular. PRONSTICO Si se trata adecuadamente, generalmente es curable dentro de las 6 semanas. POSIBLES COMPLICACIONES:  La recurrencia frecuente de los sntomas puede dar como resultado un problema crnico.  La inflamacin crnica, degeneracin cicatrizal del tendn y ruptura parcial del tendn.  Demora de la curacin o de la resolucin de los  sntomas.  Discapacidad prolongada. CONSIDERACIONES [employer] PARA EL TRATAMIENTO El tratamiento inicial incluye el uso de medicamentos y la aplicacin de hielo para reducir Chief Technology Officer y la inflamacin. Los ejercicios de elongacin y fortalecimiento pueden ayudar a reducir Chief Technology Officer con la [place]. Los ejercicios pueden Management consultant o con un  terapeuta. Los Liz Claiborne graves pueden requerir la derivacin a un fisioterapeuta para Magazine features editor evaluacin y Games developer un tratamiento, como ultrasonido. El profesional podr indicarle el uso de un dispositivo ortopdico para ayudar a Glass blower/designer y la inflamacin. A menudo, demasiado reposo en cama podr resultar en ms daos que beneficios. Podrn prescribirle inyecciones de corticoides. Sin embargo, esto deber reservarse para los casos ms graves. Es Therapist, occupational uso de la espalda cuando se levantan objetos. Por la noche, se aconseja que usted [place], sobre un [address] y coloque una almohada debajo de las rodillas. Si no se obtiene xito con Artist, ser necesario someterse a [place].  MEDICAMENTOS   Si es necesaria la administracin de medicamentos para Chief Technology Officer, se recomiendan los antiinflamatorios no esteroides, como aspirina e ibuprofeno y otros calmantes menores, como acetaminofeno.  No tome medicamentos para el dolor dentro de los [address] previos a la [place].  El profesional podr prescribirle calmantes si lo considera necesario. Utilcelos como se le indique y slo cuando lo necesite.  Podr beneficiarse con Teachers Insurance and Annuity Association.  En algunos casos se indica una inyeccin de corticosteroides. Estas inyecciones deben reservarse para los [place] graves, porque slo se pueden administrar una determinada cantidad de veces. CALOR Y FRO   El fro (con hielo) debe aplicarse durante 10 a 15 minutos cada 2  3 horas para reducir la inflamacin y Chief Technology Officer e inmediatamente despus de cualquier actividad que agrava los sntomas. Utilice bolsas o un masaje de hielo.  El calor puede usarse antes de Therapist, music y de las actividades de fortalecimiento indicadas por el profesional, le fisioterapeuta o Orthoptist. Utilice una bolsa trmica o un pao hmedo. SOLICITE ATENCIN MDICA SI:   Los sntomas empeoran o no mejoran en 2 a 4 semanas, an realizando  Pharmacist, community.  Siente adormecimiento, debilitamiento o prdida de control de la vejiga o el intestino.  Desarrolla nuevos e inexplicables sntomas. (Los medicamentos indicados en el tratamiento le ocasionan efectos secundarios). EJERCICIOS  EJERCICIOS DE AMPLITUD DE MOVIMIENTOS Cherlynn June Distensin de cintura La mayora de las personas con dolor de espalda baja encuentran que sus sntomas empeoran al doblarse hacia adelante (flexin) o al arquear la regin inferior de la espalda (extensin). Los ejercicios que le ayudarn a Oncologist sus sntomas se Research scientist (life sciences).  El mdico, fisioterapeuta o Research scientist (physical sciences) ayudarn a Chief Strategy Officer qu ejercicios sern de ayuda para resolver su dolor de espalda. No realice ningn ejercicio sin consultarlo antes con el profesional. Discontinue los ejercicios que empeoran sus sntomas, hasta que hable con el mdico. Si siente dolor, entumecimiento u hormigueo que [employer] glteos, piernas o pies, el objetivo de esta terapia es que estos sntomas se acerquen a la espalda y Customer service manager. A veces, estos sntomas en las piernas mejoran, pero el dolor de espalda empeora. Este suele ser un indicio de progreso en su rehabilitacin. Asegrese de que estar atento a cualquier cambio en sus sntomas y las actividades que ha [employer]	[place] 24 horas antes del cambio. Compartir esta informacin con su mdico le permitir mayor eficiencia para tratar su enfermedad.  Estos ejercicios le  ayudarn en la recuperacin de la lesin. Los sntomas podrn aliviarse con o sin asistencia adicional de su mdico, fisioterapeuta o Herbalist. Al completar estos ejercicios, recuerde:  Restaurar la flexibilidad del tejido ayuda a que las articulaciones recuperen el movimiento normal. Esto permite que el movimiento y la actividad sea ms saludables y menos dolorosos.  Para que sea efectiva, cada elongacin debe realizarse durante al menos 30 segundos.  La  elongacin nunca debe ser dolorosa. Deber sentir slo un alargamiento o distensin suave del tejido que estira. EJERCICIOS DE AMPLITUD DE MOVIMIENTOS Y ELONGACIN: ELONGACION Flexin - una rodilla al pecho  Recustese en una cama dura o sobre el piso, con ambas piernas extendidas al frente.  Manteniendo una pierna en contacto con el piso, lleve la rodilla opuesta al pecho. Mantenga la pierna en esa posicin, sostenindola por la zona posterior del muslo o por la rodilla.  Presione hasta sentir un suave estiramiento en la cintura. Mantenga esta posicin durante __________ segundos.  Libere la pierna lentamente y repita el ejercicio con el lado opuesto. Reptalo __________ veces. Realice este ejercicio __________ veces por da.  ELONGACIN Flexin, dos rodillas al pecho   Recustese en una cama dura o sobre el piso, con ambas piernas extendidas al frente.  Manteniendo una pierna en contacto con el piso, lleve la rodilla opuesta al pecho.  Tense los msculos del estmago para apoyar la espalda y levante la otra rodilla [place]. Mantenga las piernas en su lugar y tmese por detrs de las caderas o las rodillas.  Con ambas rodillas en el pecho, tire hasta que sienta un estiramiento en la parte trasera de la espalda. Mantenga esta posicin durante __________ segundos.  Tense los msculos del estmago y baje las piernas de a una por vez. Reptalo __________ veces. Realice este ejercicio __________ veces por da.  ELONGACIN Rotacin de la zona baja del tronco  Recustese sobre una cama firme o sobre el suelo. Mantenga las piernas al frente, doble las rodillas de modo que ambas apunten hacia el techo y los pies queden bien apoyados en el piso.  Extienda los brazos a Dance movement psychotherapist. Esto estabilizar la zona superior del cuerpo, manteniendo los hombros en contacto con el piso.  Suave y lentamente deje caer ambas rodillas juntas hacia un lado, hasta sentir un ligero estiramiento en la cintura.  Mantenga esta posicin durante __________ segundos.  Tensione los [employer] del estmago para Occupational psychologist la cintura mientras lleva las rodillas nuevamente a la posicin inicial. Repita el ejercicio hacia el otro lado. Reptalo __________ veces. Realice este ejercicio __________ veces por da. EJERCICIOS DE AMPLITUD DE MOVIMIENTOS Y FLEXIBILIDAD: ELONGACIN Extensin posicin prona sobre los codos  Acustese sobre el estmago sobre el piso, una cama ser muy blanda. Coloque las palmas a una distancia igual al ancho de los hombres y a la altura de la cabeza.  Coloque los codos bajo los hombros. Si siente dolor, colquese almohadas debajo del pecho.  Deje que su cuerpo se relaje, de modo que las caderas queden ms abajo y tengan ms contacto con el piso.  Mantenga esta posicin durante __________ segundos.  Vuelva lentamente a la posicin plana sobre el piso. Reptalo __________ veces. Realice este ejercicio __________ veces por da.  AMPLITUD DE MOVIMIENTOS - Extensin - flexin de brazos en posicin prona  Acustese sobre el [employer]	[place] piso, una cama ser [place]. Coloque las palmas a una distancia igual al ancho de los hombres y a la altura de la cabeza.  Mantenga la espalda tan relajada como pueda, enderece lentamente los codos [address] las caderas contra el suelo. Puede modificar la posicin de las manos para estar ms cmodo. A medida que gana movimiento, sus manos quedarn ms por debajo de los hombros.  Mantenga cada posicin durante __________segundos.  Vuelva lentamente a la posicin plana sobre el piso. Reptalo __________ veces. Realice este ejercicio __________ veces por da.  AMPLITUD DE MOVIMIENTOS Cuadrpedo [place] vertebral neutral  Coloque las manos y las rodillas en una superficie firme. Las manos deben quedar a la altura de los hombros y las rodillas debajo de las caderas. Puede colocar algo debajo las rodillas para estar ms cmodo.  Haga caer la  cabeza y apunte el cccix hacia el suelo debajo de usted. De este modo se redondear la cintura, en [place] similar a un gato enojado. Mantenga esta posicin durante __________ segundos.  Lentamente levante la cabeza y afloje el cccix para que se hunda el cuerpo en un gran arco, como un caballo.  Mantenga esta posicin durante __________ segundos.  Reptalo hasta sentir calor en la cintura.  Ahora encuentre su "punto ideal". Ser la posicin ms cmoda [employer]. En esta posicin es cuando su columna est neutral. Una vez que encuentre la posicin, tensione los msculos del estmago para sostener la zona inferior de la espalda.  Mantenga esta posicin durante __________ segundos. Reptalo __________ veces. Realice este ejercicio __________ veces por da.  EJERCICIOS DE FORTALECIMIENTO - Distensin de la cintura Estos ejercicios le ayudarn en la recuperacin de la lesin. Estos ejercicios deben hacerse cerca de su "punto dulce". Este es el arco neutro, de la parte baja de la espalda, en algn lugar entre la posicin completamente redondeada y arqueada plenamente, que es la posicin menos dolorosa. Cuando se realiza en [employer] de seguridad del movimiento, estos ejercicios se pueden Chemical engineer para las personas que tienen una lesin basada en flexin o extensin. Con estos ejercicios, los sntomas podrn desaparecer con o sin mayor intervencin del profesional, el fisioterapeuta o Orthoptist. Al completar estos ejercicios, recuerde:   Los msculos pueden ganar la resistencia y la fuerza necesarias para las actividades diarias a travs de ejercicios controlados.  Realice los ejercicios como se lo indic el mdico, el fisioterapeuta o Orthoptist. Aumente la resistencia y las repeticiones segn se le haya indicado.  Podr experimentar dolor o cansancio muscular, pero el dolor o molestia que trata de eliminar a travs de los ejercicios nunca debe empeorar. Si el  dolor empeora, detngase y asegrese de que est siguiendo las directivas correctamente. Si an siente dolor luego de Education officer, environmental lo ajustes necesarios, deber discontinuar el ejercicio hasta que pueda conversar con el profesional sobre el problema. FORTALECIMIENTO - Abdominales profundos - Inclinacin plvica  Recustese sobre una cama firme o sobre el suelo. Mantenga las piernas al frente, doble las rodillas de modo que ambas apunten hacia el techo y los pies queden bien apoyados en el piso.  Tensione la zona baja de los msculos abdominales para presionar la Oncologist. Este movimiento har rotar su pelvis de modo que el cccix quede hacia arriba y no apuntando a los pies o hacia el piso.  Con una tensin suave y respiracin pareja, mantenga esta posicin durante __________ segundos. Reptalo __________ veces. Realice este ejercicio __________ veces por da.  FORTALECIMIENTO Abdominales encogimiento abdominal  Recustese sobre una cama firme o sobre el suelo. Mantenga las piernas al frente, doble las rodillas de [place]  ambas apunten hacia el techo y los pies queden bien apoyados en el piso. Cruce las [employer].  Apunte suavemente con la barbilla hacia abajo, sin doblar el cuello.  Tensione los abdominales y eleve lentamente el tronco la altura suficiente para despegar los omplatos. Si se eleva ms, pondr tensin excesiva en la cintura y esto no fortalecer ms los abdominales.  Controle la vuelta a la posicin inicial. Reptalo __________ veces. Realice este ejercicio __________ veces por da.  EN CUATRO MIEMBROS Cuadrpedo, elevacin de miembro superior e inferior opuestos   [employer] y las rodillas en una superficie firme. Las manos deben quedar a la altura de los hombros y las rodillas debajo de las caderas. Puede colocar algo debajo las rodillas para estar ms cmodo.  Encuentre la posicin neutral de la columna vertebral y Public house manager los  msculos abdominales de modo que pueda mantener esta posicin. Los hombros y las caderas deben formar un rectngulo paralelo con el suelo y recto.  Manteniendo el tronco firme, eleve la mano derecha a la altura del hombro y luego eleve la pierna izquierda a la altura de la cadera. Asegrese de no contener la respiracin. Mantenga cada posicin durante __________ segundos.  Con los msculos abdominales en tensin y la espalda firme, vuelva lentamente a la posicin inicial. Repita con el otro brazo y la otra pierna. Reptalo __________ veces. Realice este ejercicio __________ veces por da.  FORTALECIMIENTO Abdominales bajos Elevacin de ambas rodillas  Recustese sobre una cama firme o sobre el suelo. Mantenga las piernas al frente, doble las rodillas de modo que ambas apunten hacia el techo y los pies queden bien apoyados en el piso.  Tensione los msculos abdominales que se encuentran en la cintura y eleve lentamente ambas rodillas hasta que queden sobre las caderas. Asegrese de no contener la respiracin.  Mantenga esta posicin durante __________ segundos. Usando los abdominales, regrese a la posicin inicial en un modo lento y controlado. Reptalo __________ veces. Realice este ejercicio __________ veces por da.  CONSIDERACIONES ACERCA DE LA POSTURA Y LA MECNICA DEL CUERPO Ditensin de la cintura Si mantiene una postura correcta cuando se encuentre de pie, sentado o realizando sus actividades, reducir el estrs Teachers Insurance and Annuity Association diferentes tejidos del cuerpo, y Acupuncturist a los tejidos lesionados la posibilidad de curarse y Film/video editor las experiencias dolorosas. A continuacin se indican pautas generales para mejorar la postura- Su mdico o fisioterapeuta le dar instrucciones especficas segn sus necesidades. Al leer estas pautas recuerde:  Los ejercicios indicados por su mdico lo ayudarn a Chartered certified accountant flexibilidad y la fuerza para Pharmacologist las posturas correctas.  La postura correcta proporciona el  mejor entorno de trabajo para las articulaciones. Las articulaciones se desgastan menos cuando estn sostenidas adecuadamente por una columna vertebral en buena postura. Esto significa que su cuerpo estar ms sano y Research officer, trade union.  La correcta postura debe practicarse en todas las actividades, especialmente al estar sentado o de pie durante [place]. Tambin es importante al realizar actividades repetitivas de bajo estrs (tipeo) o una actividad nica y pesada. POSICIONES DE Dellie Catholic Tenga en cuenta cules son las posturas que ms dolor le causan al elegir una posicin de descanso. Si siente dolor con las actividades en que deba realizar una flexin (sentarse, inclinarse, detenerse, ponerse en cuclillas), elija una posicin que le permita descansar en una postura menos flexionada. Evite curvarse en posicin fetal cuando se encuentre de lado. Si el dolor empeora con las actividades basadas en la extensin (estar  de pie durante un tiempo prolongado, trabajar con las manos por arriba de la cabeza) evite descansar en [name] posicin extendida [employer], como dormir sobre el [place]. La [employer] de las Psychologist, forensic cmodo el descanso sobre la columna vertebral en una posicin neutral, ni muy redondeada ni [place]. Recustese sobre su lado en una cama que no est hundida con una almohada entre las rodillas o sobre la espalda con una almohada bajo las rodillas, y sentir [place]. Tenga en cuenta que cualquier posicin en [employer], no importa si es una postura [place], puede provocarle rigidez. POSTURAS CORRECTAS PARA SENTARSE Con el fin de minimizar el estrs y Environmental health practitioner en su columna, deber sentarse con la postura correcta. Sentarse con una buena postura debe ser algo sin esfuerzo para un cuerpo sano. Recuperar una buena postura es un proceso gradual. Muchas personas pueden trabajar ms cmodas mediante el uso de diferentes soportes hasta que tengan la flexibilidad y la fuerza  para mantener esta postura por su cuenta. Al sentarse con la [employer], los odos deben estar sobre los hombros y los hombros sobre las caderas. Debe utilizar el respaldo de la silla para apoyar la espalda. La espalda estar en una posicin neutral, ligeramente arqueada. Puede colocar una pequea almohada o toalla doblada en la base de la espalda baja para apoyo.  Si trabaja en un escritorio, cree un ambiente que le proporciones un buen soporte y [place]. Sin apoyo adicional, msculos se cansan, lo que lleva a una tensin excesiva en las articulaciones y otros tejidos. Tenga en cuenta estas recomendaciones: SILLA:   La silla debe poder deslizarse por debajo del escritorio cuando su espalda tome contacto con el respaldo. Esto le permitir trabajar ms cerca.  La altura de la silla debe permitirle que los ojos tengan el nivel de la parte superior del monitor y las manos estn ms abajo que los codos. POSICIN DEL CUERPO  Los pies deben tener contacto con el piso. Si no es posible, use un posapies.  Mantenga las [employer] hombros. Esto reducir el estrs en el cuello y en la cintura. POSTURAS INCORRECTAS PARA SENTARSE  Si se siente cansado e incapaz de asumir una postura sentada sana, no se eche hacia atrs. Esto pone una tensin excesiva en los tejidos de su espalda, y causa ms dao y Engineer, mining. Entre las opciones ms saludables se incluyen:  El uso de ms apoyo, como una almohada lumbar.  Cambio de tareas, a algo que demande una posicin vertical o caminar.  Tomar una breve caminata.  Recostarse y Theatre stage manager posicin neutral. DE PIE DURANTE UN TIEMPO PROLONGADO E INCLINADO LIGERAMENTE HACIA ADELANTE Cuando deba realizar una tarea que requiera inclinacin hacia adelante estando de pie en el mismo sitio durante mucho tiempo, coloque un pie en un objeto de 2 a 4 pulgadas de alto, para [employer]. Cuando ambos pies estn en el piso, la zona  inferior de la espalda tiene a perder su ligera curvatura hacia adentro. Si esta curva se aplana (o se pronuncia demasiado) la espalda y las articulaciones experimentarn demasiado estrs, se fatigarn ms rpidamente y Geophysicist/field seismologist. POSTURAS CORRECTAS PARA ESTAR DE PIE Una postura adecuada de pie realizarse en todas las actividades diarias, incluso si slo toman un momento, como al [employer]. Como en la postura de sentado, los odos deben estar sobre los hombros y los hombros sobre las caderas. Deber mantener una ligera tensin en sus msculos abdominales para asegurar  la columna vertebral. El cccix debe apuntar hacia el suelo, no detrs de su cuerpo, ya que resultara en una curvatura de la espalda sobre-extendida.  POSTURAS INCORRECTAS PARA ESTAR DE PIE Las posturas incorrectas para estar de pie incluyen tener la cabeza hacia delante, las rodillas bloqueadas o una excesiva curvatura de la espalda. CAMINAR Camine en Deborha Payment erguida. Las [place], hombros y caderas deben estar alineados. ACTIVIDAD PROLONGADA EN UNA POSICIN FLEXIONADA Al completar una tarea que requiere que se doble la cintura hacia adelante o inclinarse sobre una superficie baja, trate de encontrar una manera de estabilizar 3 de cada 4 de sus miembros. Puede colocar una mano o el codo en el [place], o descansar una rodilla en la superficie en la que est apoyado. Esto le proporcionar ms estabilidad para que sus msculos no se cansen tan rpidamente. El [employer] Corporation rodillas [place], o ligeramente dobladas, tambin reducir el estrs en la espalda baja. TCNICAS CORRECTAS PARA LEVANTAR OBJETOS SI:   Asumir una postura amplia. Esto le proporcionar ms estabilidad y la oportunidad de acercarse lo ms posible al objeto que se est levantando.  Tense los abdominales para asegurar la columna vertebral. Doble las rodillas y las caderas. Manteniendo la espalda en una posicin neutral, haga el esfuerzo con los msculos  de la pierna. Levntese con las piernas, manteniendo la espalda derecha.  Pruebe el peso de los objetos desconocidos antes de tratar de Secondary school teacher.  Trate de [employer] codos hacia abajo y a los lados, con el fin de obtener la fuerza de los hombros al llevar un objeto.  Siempre pida ayuda a otra persona cuando deba levantar objetos pesados o incmodos. TCNICAS INCORRECTAS PARA LEVANTAR OBJETOS NO:   Bloquee rodillas al levantar, aunque sea un objeto pequeo.  Se doble ni gire. Gire sobre los pies ni los mueva cuando necesite cambiar de direccin.  Considere que no puede levantar incluso un clip de papel con seguridad, sin Clinical biochemist. Document Released: 05/15/2006 Document Revised: 10/21/2011 [facility] Patient Information 2015 [place], [place]. This information is not intended to replace advice given to you by your health care provider. Make sure you discuss any questions you have with your health care provider.

## 2015-03-11 ENCOUNTER — Ambulatory Visit (INDEPENDENT_AMBULATORY_CARE_PROVIDER_SITE_OTHER): Payer: Commercial Managed Care - PPO | Admitting: Family Medicine

## 2015-03-11 VITALS — BP 130/86 | HR 84 | Temp 98.5°F | Resp 18 | Ht 75.0 in | Wt 342.0 lb

## 2015-03-11 DIAGNOSIS — M545 Low back pain, unspecified: Secondary | ICD-10-CM

## 2015-03-11 DIAGNOSIS — R3989 Other symptoms and signs involving the genitourinary system: Secondary | ICD-10-CM | POA: Diagnosis not present

## 2015-03-11 DIAGNOSIS — R39198 Other difficulties with micturition: Secondary | ICD-10-CM

## 2015-03-11 LAB — POCT UA - MICROSCOPIC ONLY
BACTERIA, U MICROSCOPIC: NEGATIVE
CRYSTALS, UR, HPF, POC: NEGATIVE
Casts, Ur, LPF, POC: NEGATIVE
MUCUS UA: POSITIVE
RBC, urine, microscopic: NEGATIVE
Yeast, UA: NEGATIVE

## 2015-03-11 LAB — POCT URINALYSIS DIPSTICK
Bilirubin, UA: NEGATIVE
Blood, UA: NEGATIVE
GLUCOSE UA: NEGATIVE
Ketones, UA: NEGATIVE
Leukocytes, UA: NEGATIVE
NITRITE UA: 1
PH UA: 7
Protein, UA: NEGATIVE
SPEC GRAV UA: 1.02
Urobilinogen, UA: 0.2

## 2015-03-11 MED ORDER — CYCLOBENZAPRINE HCL 5 MG PO TABS: ORAL_TABLET | ORAL | Status: DC

## 2015-03-11 MED ORDER — TRAMADOL HCL 50 MG PO TABS: 50.0000 mg | ORAL_TABLET | Freq: Three times a day (TID) | ORAL | Status: DC | PRN

## 2015-03-11 MED ORDER — INDOMETHACIN 50 MG PO CAPS: 50.0000 mg | ORAL_CAPSULE | Freq: Two times a day (BID) | ORAL | Status: DC
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
## 2015-03-11 NOTE — Patient Instructions (Addendum)
Indomethacin si necesario 2 veces cada dia con comida por dolor. Si necesario - cyclobenzaprine por relaje musculos, y tramadol si necesario por mas dolor.  El oficina de fisioterapia voy a llamarle. No conducir o operate maquinas cuando tome cyclobenzaprine o tramadol.   Si simptomas de [place] regresen, regrese a Landscape architect por pruebe de prostata.   Dolor de espalda en el adulto (Back Pain, Adult)  El dolor de cintura es frecuente. Aproximadamente 1 de cada 5 personas lo sufren.La causa rara vez pone en peligro la vida. Con frecuencia mejora luego de algn tiempo.Alrededor de la mitad de las personas que sufren un inicio sbito de dolor de cintura, se sentirn mejor luego de 2 semanas. Aproximadamente 8 de cada 10 se sentirn mejor luego de 6 semanas.  CAUSAS  Algunas causas comunes son:   Distensin de los msculos o ligamentos que sostienen la columna vertebral.  Desgaste (degeneracin) de los discos vertebrales.  Artritis.  Traumatismos directos en la espalda. DIAGNSTICO  La mayor parte de las veces, la causa directa no se conoce.Sin embargo, Chief Technology Officer puede tratarse efectivamente an cuando no se Best boy.Una de las formas ms precisas de asegurar que la causa del dolor no constituye un peligro es responder a las preguntas del mdico acerca de su salud y sus sntomas. Si el mdico necesita ms informacin, podr indicar anlisis de laboratorio o Education officer, environmental un diagnstico por imgenes (radiografas o Health visitor).Sin embargo, aunque las [employer] modificaciones, generalmente no es necesaria la [place].  INSTRUCCIONES PARA EL CUIDADO EN EL HOGAR  En algunas personas, el dolor de espalda vuelve.Como rara vez es peligroso, los pacientes pueden aprender a Interior and spatial designer.   Mantngase activo. Si permanece sentado o de pie mucho tiempo en el mismo lugar, se tensiona la espalda.  No se siente, maneje ni se quede parado en un mismo lugar por ms de 30 minutos.  Realice caminatas cortas en superficies planas ni bien el dolor haya cedido. Trate de Copy tiempo que camina .  No se quede en la cama.Si hace reposo durante ms de 1 o 2 das, puede Estate agent.  No evite los ejercicios ni el trabajo.El cuerpo est hecho para moverse.No es peligroso estar [place], aunque le duela la espalda.La espalda se curar ms rpido si contina sus actividades antes de que el dolor se vaya.  Preste atencin a su cuerpo cuando se incline y se levante. Muchas personas sienten menos molestias cuando levantan objetos si doblan las rodillas, mantienen la carga cerca del cuerpo y evitan torcerse. Generalmente, las posiciones ms cmodas son las que ejercen menos tensin en la espalda en recuperacin.  Encuentre una posicin cmoda para dormir. Utilice un colchn firme y recustese de [place]. Doble ligeramente sus rodillas. Si se recuesta sobre su espalda, coloque una almohada debajo de sus rodillas.  Tome slo medicamentos de venta libre o recetados, segn las indicaciones del mdico. Los medicamentos de venta libre para Primary school teacher y reducir Futures trader, son los que en general ms ayudan.El mdico podr prescribirle relajantes musculares.Estos medicamentos calman el dolor de modo que pueda retornar a sus actividades normales y a Investment banker, operational.  Aplique hielo sobre la zona lesionada.  Ponga el hielo en una bolsa plstica.  Colquese una toalla entre la piel y la bolsa de hielo.  Deje la bolsa de hielo durante 15 a 20 minutos 3 a 4 veces por da, durante los primeros 2  3 das. Luego podr alternar Eusebio Me calor y  hielo para reducir Chief Technology Officer y los espasmos.  Consulte a su mdico si puede tratar de hacer ejercicios para la espalda y recibir un masaje suave. Pueden ser beneficiosos.  Evite sentirse ansioso o estresado.El estrs aumenta la tensin muscular y puede empeorar el dolor de espalda.Es importante reconocer  cuando est ansioso o estresado y aprender la forma de controlarlos.El ejercicio es una gran opcin. SOLICITE ATENCIN MDICA SI:   Siente un dolor que no se alivia con reposo o medicamentos.  El dolor no mejora en 1 semana.  Desarrolla nuevos sntomas.  No se siente bien en general. SOLICITE ATENCIN MDICA DE INMEDIATO SI:  Siente un dolor que se irradia desde la espalda hacia sus piernas.  Desarrolla nuevos problemas en el intestino o la vejiga.  Siente debilidad o adormecimiento inusual en sus brazos o piernas.  Presenta nuseas o vmitos.  Presenta dolor abdominal.  Se siente desfalleciente. Document Released: 07/29/2005 Document Revised: 01/28/2012 [facility] Patient Information 2015 [place], [place]. This information is not intended to replace advice given to you by your health care provider. Make sure you discuss any questions you have with your health care provider.

## 2015-04-14 ENCOUNTER — Other Ambulatory Visit: Payer: Self-pay | Admitting: Internal Medicine

## 2015-05-22 ENCOUNTER — Ambulatory Visit (INDEPENDENT_AMBULATORY_CARE_PROVIDER_SITE_OTHER): Payer: Commercial Managed Care - PPO | Admitting: Family Medicine

## 2015-05-22 VITALS — BP 132/90 | HR 87 | Temp 98.7°F | Resp 18 | Ht 75.0 in | Wt 340.0 lb

## 2015-05-22 DIAGNOSIS — J4531 Mild persistent asthma with (acute) exacerbation: Secondary | ICD-10-CM

## 2015-05-22 MED ORDER — HYDROCODONE-HOMATROPINE 5-1.5 MG/5ML PO SYRP: 5.0000 mL | ORAL_SOLUTION | Freq: Three times a day (TID) | ORAL | Status: DC | PRN

## 2015-05-22 MED ORDER — PREDNISONE 20 MG PO TABS: ORAL_TABLET | ORAL | Status: DC

## 2015-05-22 NOTE — Progress Notes (Signed)
° °  Subjective:    Patient ID: Brian Reilly, male    DOB: 25-Apr-1961, 54 y.o.   MRN: [id number] This chart was scribed for Elvina Sidle, MD by Jolene Provost, Medical Scribe. This patient was seen in Room 3 and the patient's care was started a 5:48 PM.  Chief Complaint  Patient presents with   Cough    x3 weeks some mucous    Sore Throat   Generalized Body Aches    with cough     HPI HPI Comments: Brian Reilly is a 54 y.o. male who presents to [facility] complaining of productive cough for the last 3-4 weeks with associated abdominal pain when he coughs. The pain is not worse with swallowing. The pt denies fever or SOB. He had a past hx of asthma when he was a child.  He works as a Museum/gallery exhibitions officer.   Review of Systems  Constitutional: Negative for fever and chills.  Respiratory: Positive for cough. Negative for shortness of breath.   Gastrointestinal: Positive for abdominal pain. Negative for nausea and vomiting.       Objective:   Physical Exam  Constitutional: He is oriented to person, place, and time. He appears well-developed and well-nourished. No distress.  HENT:  Head: Normocephalic and atraumatic.  Eyes: Pupils are equal, round, and reactive to light.  Neck: Neck supple.  Cardiovascular: Normal rate.   Pulmonary/Chest: Effort normal. No respiratory distress. He has wheezes.  Bilateral wheezes  Musculoskeletal: Normal range of motion.  Neurological: He is alert and oriented to person, place, and time. Coordination normal.  Skin: Skin is warm and dry. He is not diaphoretic.  Psychiatric: He has a normal mood and affect. His behavior is normal.  Nursing note and vitals reviewed.   Filed Vitals:   05/22/15 1659  BP: 132/90  Pulse: 87  Temp: 98.7 F (37.1 C)  TempSrc: Oral  Resp: 18  Height:  (1.905 m)  Weight: 340 lb (154.223 kg)  SpO2: 95%       Assessment & Plan:   This chart was scribed in my presence and reviewed by me personally.   ICD-9-CM ICD-10-CM   1. Asthma with acute exacerbation, mild persistent 493.92 J45.31 predniSONE (DELTASONE) 20 MG tablet     HYDROcodone-homatropine (HYCODAN) 5-1.5 MG/5ML syrup     Signed, Elvina Sidle, MD   By signing my name below, I, Javier Docker, attest that this documentation has been prepared under the direction and in the presence of Elvina Sidle, MD. Electronically Signed: Javier Docker, ER Scribe. 05/22/2015. 5:48 PM.

## 2015-05-22 NOTE — Patient Instructions (Signed)
Bronquitis aguda  (Acute Bronchitis)  La bronquitis es una inflamación de las vías respiratorias que se extienden desde la tráquea hasta los pulmones (bronquios). La inflamación produce la formación de mucosidad. Esto produce tos, que es el síntoma más frecuente de la bronquitis.   Cuando la bronquitis es aguda, generalmente comienza de manera súbita y desaparece luego de un par de semanas. El hábito de fumar, las alergias y el asma pueden empeorar la bronquitis. Los episodios repetidos de bronquitis pueden causar más problemas pulmonares.   CAUSAS  La causa más frecuente de bronquitis aguda es el mismo virus que produce el resfrío. El virus puede propagarse de una persona a la otra (contagioso) a través de la tos y los estornudos, y al tocar objetos contaminados.  SIGNOS Y SÍNTOMAS   · Tos.  · Fiebre.  · Tos con mucosidad.  · Dolores en el cuerpo.  · Congestión en el pecho.  · Escalofríos.  · Falta de aire.  · Dolor de garganta.  DIAGNÓSTICO   La bronquitis aguda en general se diagnostica con un examen físico. El médico también le hará preguntas sobre su historia clínica. En algunos casos se indican otros estudios, como radiografías, para descartar otras enfermedades.   TRATAMIENTO   La bronquitis aguda generalmente desaparece en un par de semanas. Con frecuencia, no es necesario realizar un tratamiento. Los medicamentos se indican para aliviar la fiebre o la tos. Generalmente, no es necesario el uso de antibióticos, pero pueden recetarse en ciertas ocasiones. En algunos casos, se recomienda el uso de un inhalador para mejorar la falta de aire y controlar la tos. Un vaporizador de aire frío podrá ayudarlo a disolver las secreciones bronquiales y facilitar su eliminación.   INSTRUCCIONES PARA EL CUIDADO EN EL HOGAR   · Descanse lo suficiente.  · Beba líquidos en abundancia para mantener la orina de color claro o amarillo pálido (excepto que padezca una enfermedad que requiera la restricción de líquidos). El aumento  de líquidos puede ayudar a que las secreciones respiratorias (esputo) sean menos espesas y a reducir la congestión del pecho, y evitará la deshidratación.  · Tome los medicamentos solamente como se lo haya indicado el médico.  · Si le recetaron antibióticos, asegúrese de terminarlos, incluso si comienza a sentirse mejor.  · Evite fumar o aspirar el humo de otros fumadores. La exposición al humo del cigarrillo o a irritantes químicos hará que la bronquitis empeore. Si fuma, considere el uso de goma de mascar o la aplicación de parches en la piel que contengan nicotina para aliviar los síntomas de abstinencia. Si deja de fumar, sus pulmones se curarán más rápido.  · Reduzca la probabilidad de otro episodio de bronquitis aguda lavando sus manos con frecuencia, evitando a las personas que tengan síntomas y tratando de no tocarse las manos con la boca, la nariz o los ojos.  · Concurra a todas las visitas de control como se lo haya indicado el médico.  SOLICITE ATENCIÓN MÉDICA SI:  Los síntomas no mejoran después de una semana de tratamiento.   SOLICITE ATENCIÓN MÉDICA DE INMEDIATO SI:  · Comienza a tener fiebre o escalofríos cada vez más intensos.  · Siente dolor en el pecho.  · Le falta el aire de manera preocupante.  · La flema tiene sangre.  · Se deshidrata.  · Se desmaya o siente que va a desmayarse de forma repetida.  · Tiene vómitos que se repiten.  · Tiene un dolor de cabeza intenso.  ASEGÚRESE DE QUE:   ·   Comprende estas instrucciones.  · Controlará su afección.  · Recibirá ayuda de inmediato si no mejora o si empeora.     Esta información no tiene como fin reemplazar el consejo del médico. Asegúrese de hacerle al médico cualquier pregunta que tenga.     Document Released: 07/29/2005 Document Revised: 08/19/2014  Elsevier Interactive Patient Education ©2016 Elsevier Inc.

## 2015-05-24 ENCOUNTER — Other Ambulatory Visit: Payer: Self-pay | Admitting: Family Medicine

## 2015-05-30 ENCOUNTER — Ambulatory Visit (INDEPENDENT_AMBULATORY_CARE_PROVIDER_SITE_OTHER): Payer: Commercial Managed Care - PPO | Admitting: Family Medicine

## 2015-05-30 ENCOUNTER — Ambulatory Visit (INDEPENDENT_AMBULATORY_CARE_PROVIDER_SITE_OTHER): Payer: Commercial Managed Care - PPO

## 2015-05-30 VITALS — BP 140/86 | HR 97 | Temp 98.6°F | Resp 20 | Ht 73.5 in | Wt 325.4 lb

## 2015-05-30 DIAGNOSIS — R059 Cough, unspecified: Secondary | ICD-10-CM

## 2015-05-30 DIAGNOSIS — D72829 Elevated white blood cell count, unspecified: Secondary | ICD-10-CM | POA: Diagnosis not present

## 2015-05-30 DIAGNOSIS — J45901 Unspecified asthma with (acute) exacerbation: Secondary | ICD-10-CM

## 2015-05-30 DIAGNOSIS — R05 Cough: Secondary | ICD-10-CM | POA: Diagnosis not present

## 2015-05-30 DIAGNOSIS — R509 Fever, unspecified: Secondary | ICD-10-CM

## 2015-05-30 LAB — POCT CBC
GRANULOCYTE PERCENT: 58.2 % (ref 37–80)
HEMATOCRIT: 50.5 % (ref 43.5–53.7)
HEMOGLOBIN: 17 g/dL (ref 14.1–18.1)
Lymph, poc: 5.2 — AB (ref 0.6–3.4)
MCH: 27.6 pg (ref 27–31.2)
MCHC: 33.7 g/dL (ref 31.8–35.4)
MCV: 81.8 fL (ref 80–97)
MID (cbc): 0.7 (ref 0–0.9)
MPV: 9.8 fL (ref 0–99.8)
PLATELET COUNT, POC: 238 10*3/uL (ref 142–424)
POC GRANULOCYTE: 8.3 — AB (ref 2–6.9)
POC LYMPH PERCENT: 36.8 %L (ref 10–50)
POC MID %: 5 %M (ref 0–12)
RBC: 6.17 M/uL — AB (ref 4.69–6.13)
RDW, POC: 14.6 %
WBC: 14.2 10*3/uL — AB (ref 4.6–10.2)

## 2015-05-30 MED ORDER — AZITHROMYCIN 250 MG PO TABS: ORAL_TABLET | ORAL | Status: DC

## 2015-05-30 MED ORDER — ALBUTEROL SULFATE HFA 108 (90 BASE) MCG/ACT IN AERS: 1.0000 | INHALATION_SPRAY | RESPIRATORY_TRACT | Status: DC | PRN

## 2015-05-30 MED ORDER — BENZONATATE 100 MG PO CAPS: 100.0000 mg | ORAL_CAPSULE | Freq: Three times a day (TID) | ORAL | Status: DC | PRN

## 2015-05-30 MED ORDER — ALBUTEROL SULFATE (2.5 MG/3ML) 0.083% IN NEBU
2.5000 mg | INHALATION_SOLUTION | Freq: Once | RESPIRATORY_TRACT | Status: AC
  Administered 2015-05-30: 2.5 mg via RESPIRATORY_TRACT
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
## 2015-05-30 NOTE — Patient Instructions (Signed)
Azithromycin para infeccion, albuterol si necesario por tos y "wheezing". Tessalon por tos. regrese en dos o tres dias si no esta mejor. Mas temprano si empeorse.    Bronquitis aguda (Acute Bronchitis) La bronquitis es una inflamacin de las vas respiratorias que se extienden desde la trquea Lubrizol Corporationhasta los pulmones (bronquios). La inflamacin produce la formacin de mucosidad. Esto produce tos, que es el sntoma ms frecuente de la bronquitis.  Cuando la bronquitis es [place]aguda, generalmente comienza de Shawneelandmanera sbita y desaparece luego de un par de semanas. El hbito de fumar, las alergias y el asma pueden empeorar la bronquitis. Los episodios repetidos de bronquitis pueden causar ms problemas pulmonares.  CAUSAS La causa ms frecuente de bronquitis aguda es el mismo virus que produce el resfro. El virus puede propagarse de Neomia Dearuna persona a la otra (contagioso) a travs de la tos y los estornudos, y al tocar objetos contaminados. SIGNOS Y SNTOMAS   Tos.  Grant RutsFiebre.  Tos con mucosidad.  Dolores PepsiCoen el cuerpo.  Congestin en el pecho.  Escalofros.  Falta de aire.  Dolor de Advertising copywritergarganta. DIAGNSTICO  La bronquitis aguda en general se diagnostica con un examen fsico. El mdico tambin le har preguntas sobre su historia clnica. En algunos casos se indican otros estudios, como radiografas, para Risk managerdescartar otras enfermedades.  TRATAMIENTO  La bronquitis aguda generalmente desaparece en un par de semanas. Con frecuencia, no es Quarry managernecesario realizar un tratamiento. Los medicamentos se indican para aliviar la fiebre o la tos. Generalmente, no es necesario el uso de antibiticos, pero pueden recetarse en ciertas ocasiones. En algunos casos, se recomienda el uso de un inhalador para mejorar la falta de aire y Scientist, physiologicalcontrolar la tos. Un vaporizador de aire fro podr ayudarlo a MeadWestvacodisolver las secreciones bronquiales y Statisticianfacilitar su eliminacin.  INSTRUCCIONES PARA EL CUIDADO EN EL HOGAR  Descanse lo suficiente.  Beba  lquidos en abundancia para mantener la orina de color claro o amarillo plido (excepto que padezca una enfermedad que requiera la restriccin de lquidos). El aumento de lquidos puede ayudar a que las secreciones respiratorias (esputo) sean menos espesas y a reducir la congestin del pecho, y Transport plannerevitar la deshidratacin.  Tome los medicamentos solamente como se lo haya indicado el mdico.  Si le recetaron antibiticos, asegrese de terminarlos, incluso si comienza a sentirse mejor.  Evite fumar o aspirar el humo de otros fumadores. La exposicin al humo del cigarrillo o a irritantes qumicos har que la bronquitis empeore. Si fuma, considere el uso de goma de Theatre managermascar o la aplicacin de parches en la piel que contengan nicotina para Paramedicaliviar los sntomas de abstinencia. Si deja de fumar, sus pulmones se curarn ms rpido.  Reduzca la probabilidad de otro episodio de bronquitis aguda lavando sus manos con frecuencia, evitando a las personas que tengan sntomas y tratando de no tocarse las manos con la boca, la nariz o los ojos.  Concurra a todas las visitas de control como se lo haya indicado el mdico. SOLICITE ATENCIN MDICA SI: Los sntomas no mejoran despus de una semana de Montrose Manortratamiento.  SOLICITE ATENCIN MDICA DE INMEDIATO SI:  Comienza a tener fiebre o escalofros cada vez ms intensos.  Siente dolor en el pecho.  Le falta el aire de manera preocupante.  La flema tiene Union Centersangre.  Se deshidrata.  Se desmaya o siente que va a desmayarse de forma repetida.  Tiene vmitos que se repiten.  Tiene un dolor de cabeza intenso. ASEGRESE DE QUE:   Comprende estas instrucciones.  Controlar su afeccin.  Recibir ayuda de inmediato si no mejora o si empeora.   Esta informacin no tiene Theme park manager el consejo del mdico. Asegrese de hacerle al mdico cualquier pregunta que tenga.   Document Released: 07/29/2005 Document Revised: 08/19/2014 Elsevier Interactive Patient  Education 2016 [employer].   Asma - Adultos (Asthma, Adult) El asma es una enfermedad recurrente en la que las vas respiratorias se Engineer, technical sales y [place]. Puede causar dificultad para respirar. Provoca tos, sibilancias y sensacin de falta de aire. Los episodios de asma, tambin llamados crisis de asma, pueden ser leves o potencialmente mortales. El asma no puede curarse, pero los United Parcel y los cambios en el estilo de vida lo ayudarn a Scientist, physiological enfermedad. CAUSAS Se cree que la causa del asma son factores hereditarios (genticos) y la exposicin a factores ambientales; sin embargo, su causa exacta se desconoce. El asma generalmente es desencadenada por alrgenos, infecciones en los pulmones o sustancias irritantes que se encuentran en el aire. Los desencadenantes del asma son diferentes para cada persona. Los factores desencadenantes comunes incluyen:   Caspa de los [place].  caros del polvo.  [place].  El polen de los rboles o el csped.  Moho.  Humo.  Sustancias contaminantes como el polvo, limpiadores del hogar, sprays para el cabello, aerosoles, vapores de pintura, sustancias qumicas fuertes u olores intensos.  El [place] fro, los cambios de Marketing executive y el viento (que aumenta la cantidad de moho y polen en el aire).  Emociones intensas, como llorar o rer Automatic Data.  Estrs.  Ciertos medicamentos (como la aspirina) o algunos frmacos (como los betabloqueantes).  Los sulfitos que contienen los alimentos y las bebidas. Los alimentos y bebidas que pueden contener sulfitos son las frutas desecadas, las papas fritas y los vinos espumantes.  Enfermedades infecciosas o inflamatorias, como la gripe, el resfro o la inflamacin de las membranas nasales (rinitis).  El reflujo gastroesofgico (ERGE).  Los ejercicios o actividades extenuantes. SNTOMAS Los sntomas pueden ocurrir inmediatamente despus de que se desencadena el asma o muchas horas ms tarde. Los  sntomas son:  Sibilancias.  Tos excesiva durante la noche o temprano por la maana.  Tos frecuente o intensa durante un resfro comn.  Opresin en el pecho.  Falta de aire. DIAGNSTICO  El diagnstico se realiza mediante la revisin de su historia clnica y de un examen fsico. Es posible que le indiquen algunos estudios. Estos pueden incluir:  Estudios de la funcin pulmonar. Estas pruebas indican cunto aire inhala y exhala.  Pruebas de alergia.  Estudios de diagnstico por imgenes, como radiografas. TRATAMIENTO  El asma no puede curarse, pero puede controlarse. El Brewing technologist identificar y Automotive engineer los factores desencadenantes del asma. Tambin incluye medicamentos. Hay dos tipos de medicamentos utilizados en el tratamiento para el asma:   Medicamentos de control del asma. Impiden que aparezcan los sntomas. Generalmente se Crown Holdings.  Medicamentos de [place] o de rescate. Alivian los sntomas rpidamente. Se utilizan cuando es necesario y proporcionan alivio a Product manager. El mdico lo ayudar a Runner, broadcasting/film/video de accin para el asma, que es una planificacin por escrito para el control y el tratamiento de las crisis de asma. Incluye una lista de los factores desencadenantes y el modo en que pueden evitarse. Tambin incluye informacin acerca del momento en que se deben USAA y cundo se debe cambiar la dosis. Un plan de accin tambin incluye el uso de un dispositivo llamado espirmetro. El espirmetro es un dispositivo que mide el funcionamiento  de los pulmones. Lo ayuda a controlar la enfermedad. INSTRUCCIONES PARA EL CUIDADO EN EL HOGAR   Tome los medicamentos solamente como se lo haya indicado el mdico. Hable con el mdico si tiene preguntas acerca de cmo o cundo tomar los medicamentos.  Use un espirmetro de acuerdo con las indicaciones del mdico. Anote y Audiological scientist un registro de los [place].  Conozca y Goodrich Corporation de accin  para ayudar a minimizar o detener una crisis de asma sin necesidad de buscar atencin mdica.  Controle el ambiente de su hogar de la siguiente manera para prevenir las crisis de asma:  No fume. Evite la exposicin al humo de otros fumadores.  Cambie regularmente el filtro de la calefaccin y del aire acondicionado.  Limite el uso de hogares o estufas a lea.  Elimine las plagas (como cucarachas, ratones) y sus excrementos.  Elimine las plantas si observa moho en ellas.  Limpie habitualmente los pisos y el polvo. Utilice productos sin perfume.  Intente que otra persona pase la aspiradora con regularidad. Permanezca fuera de las habitaciones mientras son aspiradas y por algn tiempo despus. Si usted pasa la Jaclyn Prime, use una mscara para polvo de las que se consiguen en la [place], una bolsa de aspiradora de doble capa o microfiltro o una aspiradora con un filtro HEPA.  Reemplace las alfombras por pisos de [place], baldosas o vinilo. Las alfombras pueden retener la caspa de los animales y [place].  Use almohadas, mantas y cubre colchones antialrgicos.  Lave las sbanas y las mantas todas las semanas con agua caliente y squelas con aire caliente.  Use mantas de polister o algodn.  Limpie baos y cocinas con lavandina. Si fuera posible, pdale a alguien que vuelva a pintar las paredes de estas habitaciones con [name] pintura resistente a los hongos. Aljese de las habitaciones que se estn limpiando y pintando.  Lvese las manos con frecuencia. SOLICITE ATENCIN MDICA SI:   Respira con dificultad an cuando toma el medicamento para prevenir las crisis.  La mucosidad coloreada que expectora cuando tose (esputo) es ms espesa que lo habitual.  Su esputo cambia de un color claro o blanco a un color amarillo, verde, gris o sanguinolento.  Tiene trastornos ocasionados por el medicamento que est tomando (como urticaria, picazn, hinchazn o dificultades  respiratorias).  Necesita un medicamento aliviador ms de 2 o 3 veces por semana.  Su flujo espiratorio mximo se mantiene entre el 50% y el 79% de su Arts administrator personal, despus de seguir el plan de accin durante 1hora.  Tiene fiebre. SOLICITE ATENCIN MDICA DE INMEDIATO SI:   Usted parece empeorar y no responde al tratamiento durante una crisis de asma.  Le falta el aire, incluso en reposo.  Le falta el aire an cuando hace muy poca actividad fsica.  Tiene dificultad para comer, beber o hablar debido a los sntomas del asma.  Siente dolor en el pecho.  Se le acelera la frecuencia cardaca.  Tiene los labios o las uas de tono [place].  Siente que est por desvanecerse, est mareado o se desmaya.  Su flujo mximo es Adult nurse del 50% del Arts administrator personal.   Esta informacin no tiene Theme park manager el consejo del mdico. Asegrese de hacerle al mdico cualquier pregunta que tenga.   Document Released: 07/29/2005 Document Revised: 04/19/2015 Elsevier Interactive Patient Education [employer]

## 2015-07-10 ENCOUNTER — Other Ambulatory Visit: Payer: Self-pay | Admitting: Internal Medicine

## 2015-07-11 ENCOUNTER — Other Ambulatory Visit: Payer: Self-pay | Admitting: Internal Medicine

## 2015-07-14 ENCOUNTER — Other Ambulatory Visit: Payer: Self-pay | Admitting: Internal Medicine

## 2015-07-14 DIAGNOSIS — E1121 Type 2 diabetes mellitus with diabetic nephropathy: Secondary | ICD-10-CM

## 2015-07-14 DIAGNOSIS — I10 Essential (primary) hypertension: Secondary | ICD-10-CM

## 2015-07-18 MED ORDER — LISINOPRIL 2.5 MG PO TABS: 2.5000 mg | ORAL_TABLET | Freq: Every day | ORAL | Status: DC

## 2015-07-18 MED ORDER — METFORMIN HCL 850 MG PO TABS: ORAL_TABLET | ORAL | Status: DC

## 2015-07-18 MED ORDER — SIMVASTATIN 40 MG PO TABS: 40.0000 mg | ORAL_TABLET | Freq: Every evening | ORAL | Status: DC

## 2015-07-18 NOTE — Telephone Encounter (Signed)
i have made an appt for pt for 1/6 w/ dr Senaida Oresrichardson, he states he will come to the appt but he is not happy

## 2015-07-19 ENCOUNTER — Telehealth: Payer: Self-pay | Admitting: Internal Medicine

## 2015-07-19 NOTE — Telephone Encounter (Signed)
Pt requesting metoprolol to be filled. °

## 2015-07-20 NOTE — Telephone Encounter (Signed)
Spoke with pt using interpreter [place] [phone]#[id number]. Explained to pt that his PCP is not going to refill Metoprolol until he is seen because it could be contributing to his asthma.  Pt scheduled to see Dr. Delia Chimes Patel on 12/13 at 3:45pm.  He does not want to be off his BP medications the whole month of Dec.  Pt did express difficulty getting to the clinic due to no more PTO with his employer, he was okay being added but may cancel if employer not willing to let him off.  FYI Dr. Heide SparkNarendra

## 2015-07-25 ENCOUNTER — Ambulatory Visit (INDEPENDENT_AMBULATORY_CARE_PROVIDER_SITE_OTHER): Payer: Commercial Managed Care - PPO | Admitting: Internal Medicine

## 2015-07-25 ENCOUNTER — Encounter: Payer: Self-pay | Admitting: Internal Medicine

## 2015-07-25 VITALS — BP 139/86 | HR 72 | Temp 97.9°F | Ht 74.5 in | Wt 323.4 lb

## 2015-07-25 DIAGNOSIS — K219 Gastro-esophageal reflux disease without esophagitis: Secondary | ICD-10-CM

## 2015-07-25 DIAGNOSIS — Z7984 Long term (current) use of oral hypoglycemic drugs: Secondary | ICD-10-CM | POA: Diagnosis not present

## 2015-07-25 DIAGNOSIS — I10 Essential (primary) hypertension: Secondary | ICD-10-CM

## 2015-07-25 DIAGNOSIS — E119 Type 2 diabetes mellitus without complications: Secondary | ICD-10-CM

## 2015-07-25 DIAGNOSIS — E1165 Type 2 diabetes mellitus with hyperglycemia: Secondary | ICD-10-CM | POA: Diagnosis not present

## 2015-07-25 DIAGNOSIS — IMO0001 Reserved for inherently not codable concepts without codable children: Secondary | ICD-10-CM

## 2015-07-25 LAB — POCT GLYCOSYLATED HEMOGLOBIN (HGB A1C): HEMOGLOBIN A1C: 11.8

## 2015-07-25 LAB — GLUCOSE, CAPILLARY: GLUCOSE-CAPILLARY: 223 mg/dL — AB (ref 65–99)

## 2015-07-25 MED ORDER — PANTOPRAZOLE SODIUM 40 MG PO TBEC: 40.0000 mg | DELAYED_RELEASE_TABLET | Freq: Every day | ORAL | Status: DC

## 2015-07-25 MED ORDER — SIMVASTATIN 40 MG PO TABS
40.0000 mg | ORAL_TABLET | Freq: Every evening | ORAL | Status: DC
Start: 2015-07-25 — End: 2016-08-02

## 2015-07-25 MED ORDER — METFORMIN HCL 1000 MG PO TABS: 1000.0000 mg | ORAL_TABLET | Freq: Two times a day (BID) | ORAL | Status: DC

## 2015-07-25 MED ORDER — HYDROCHLOROTHIAZIDE 12.5 MG PO CAPS: 12.5000 mg | ORAL_CAPSULE | Freq: Every day | ORAL | Status: DC

## 2015-07-25 MED ORDER — LISINOPRIL 2.5 MG PO TABS: 2.5000 mg | ORAL_TABLET | Freq: Every day | ORAL | Status: DC

## 2015-07-25 NOTE — Assessment & Plan Note (Signed)
Overview Since Sunday, he reports his blood sugars have been trending 200s to 400s. He is unsure of what normal glycemic control looks like for him. He has only been checking his blood sugar since Sunday 2-3 times daily. He does report taking metformin 850 mg twice daily. He also expresses questions about why he must have his feet checked yearly. He would like to know more about why his blood sugars have been running so high since Sunday and does acknowledge he can do better with regards to what he eats. He denies any symptoms of hypoglycemia, like dizziness, nausea, vomiting, diaphoresis, abdominal pain.  Assessment Poorly controlled type 2 diabetes. Glycemic control deteriorated given A1c 6.7 back in June but is now 11.8. No signs of end organ damage which is reassuring.  Plan -Explained to him the significance of the A1c as a marker for glycemic control that his high A1c is suggestive of poor control over the last several months -Spent extensive time educating him on the end organ signs of poorly controlled diabetes, like retinopathy, neuropathy, nephropathy, to which he expressed interest and a resolve to do better -Increased metformin to 1000 mg twice daily with plan to follow-up in 2 weeks. -Counseled him on the importance of diet and nutrition to which acknowledged understanding. He is also agreeable to having Ms. Lupita LeashDonna Plyler weigh in with nutrition recommendations and help on checking blood sugars. -Performed foot exam today which was reassuring for no neuropathy. -Defer referral for eye exam until next visit to avoid overwhelming him

## 2015-07-25 NOTE — Assessment & Plan Note (Addendum)
Overview On 12/7, he spoke with one of our triage nurses regarding his metoprolol refill as his PCP did not want to refill this medication without assessment of whether it was worsening his underlying asthma.   Blood pressure today is 139/86. His other blood pressure medications include lisinopril 2.5 mg daily, HCTZ 12.5 mg daily which he reports taking daily. He has been out of metoprolol for the last several weeks. He denies any headache, vision changes, chest pain, shortness of breath.  Assessment Essential hypertension, well controlled on 2 agents. Unclear the indications for beta blocker therapy in the absence of systolic congestive heart failure, essential tremor, prior history of myocardial infarction.  Plan -Continue lisinopril 2.5 mg and HCTZ 12.5 mg daily -If blood pressure is elevated at the time of follow-up, consider increasing lisinopril -Check BMET today [last checked in January 2016]  ADDENDUM 07/26/2015  11:30 AM:  BMET reassuring for no acidosis. Renal function stable from last year.

## 2015-07-25 NOTE — Patient Instructions (Addendum)
Por favor, tome metformina 1000 mg dos veces al C.H. Robinson Worldwideda. La S[facility] HendersonDonna Reilly le dar una llamada sobre cmo controlar los azcares en la Men[place]angre.  Deje de tomar metoprolol.  Le llamaremos para una cita en 2 semanas.  Diabetes mellitus tipo2, adultos (Type 2 Diabetes Mellitus, Adult) La diabetes mellitus tipo2 es una enfermedad de larga duracin (crnica). En la diabetes tipo 2:  El pncreas no produce la cantidad suficiente de una hormona llamada insulina.  Las clulas del cuerpo no responden a la USG Corporationinsulina que se produce.  Pueden ocurrir ambas situaciones. Normalmente, la Johnson Controlsinsulina mueve los azcares de los alimentos a las clulas de los tejidos. Esto le proporciona la energa. Si usted tiene diabetes tipo 2, los azcares no se Production designer, theatre/television/filmpueden mover a las clulas del tejido. Esto produce un aumento del nivel de Bankerazcar en la sangre (hiperglucemia).  El Avayamdico establecer los objetivos personales del tratamiento para usted en funcin de su edad, los medicamentos que toma, el tiempo que hace que tiene diabetes y cualquier otra enfermedad que padezca. Generalmente, el objetivo del tratamiento es State Street Corporationmantener los siguientes niveles sanguneos de glucosa:  Antes de las comidas (preprandial): de 80 a 130 mg/dl.  Despus de las comidas (posprandial): por debajo de 180 mg/dl.  A1c: menos del 6,5 % al 7 %. CUIDADOS EN EL HOGAR  Controle su nivel de hemoglobina A1c dos veces al ao. Este nivel muestra si la diabetes est controlada o est fuera de control.  Mdase el nivel de azcar en la sangre todos los Greenfielddas como se lo indic su mdico.  Verifique el nivel de cetonas en la orina cuando est enfermo y segn lo que le hayan indicado.  CenterPoint Energyome los medicamentos para la diabetes o para la insulina tal como se los prescribi el mdico.  [place]oundNunca se quede sin insulina.  Ajuste la cantidad de insulina que se administra segn la cantidad de carbohidratos (hidratos de carbono) que come. Los hidratos de carbono se  encuentran en muchos alimentos, tales como frutas, verduras, cereales integrales y productos lcteos.  Tome una colacin sana entre cada comida saludable. Debe consumir 3 comidas y 3 colaciones diarias.  Baje de peso si es necesario.  Lleve una tarjeta de alerta mdica o use una pulsera o medalla de alerta mdica.  Lleve una colacin de 15gramos de hidratos de carbono en todo momento. Por ejemplo:  Pastillas de glucosa, 3 o 4  Gel de glucosa, tubo de 15 gramos.  Pasas de uva, 2 cucharadas (24 gramos).  Caramelos de goma, 6.  Galletas de Defianceanimales, 8.  Gaseosa comn (no diettica), 4onzas (120mililitros).  Pastillas de goma, 9.  Note los sntomas de niveles bajos de Bankerazcar en la sangre (hipoglucemia), como:  Sacudidas (temblores).  Dificultad para pensar claramente.  Sudoracin.  Frecuencia cardaca acelerada.  Dolor de Turkmenistancabeza.  Sequedad en la boca.  Hambre.  Malhumor (irritabilidad).  Preocupacin o tensin (ansiedad).  Sueo agitado.  Cambios en el habla o en la coordinacin.  Confusin.  Controle la hipoglucemia inmediatamente. Si usted est alerta y puede tragar, siga la regla de 15/15:  Tome entre 15 y 20 gramos de glucosa o hidratos de carbono de accin rpida. Incluye un gel de glucosa, pastillas de glucosa o 4 onzas (120 mililitros) de jugo de frutas, gaseosa comn, o PPG Industriesleche descremada.  Verifique su nivel de azcar en la sangre 15minutos despus de tomar la glucosa.  Tome entre 15 y 20gramos ms de la glucosa si al repetir el anlisis del nivel de azcar  en la sangre todava es de /dl (miligramos/decilitro) o inferior.  Ingiera una comida o un aperitivo 1 hora despus de Psychologist, occupational los niveles de Banker.  Note los primeros sntomas de niveles elevados de azcar en la sangre, tales como:  Tener mucha sed o beber mucho (polidipsia).  Orinar mucho (poliuria).  Haga por lo menos de [place] fsica a la semana o  segn lo que le hayan indicado.  Divida los 150 minutos de actividad durante la semana. No haga los de Engineer, manufacturing.  Realice ejercicios como levantar pesas, por lo menos 2 veces a la semana o segn lo que le hayan indicado.  No permanezca inactivo durante ms de seguidos.  Ajuste la insulina o la ingesta de alimentos, segn sea necesario, si inicia un nuevo ejercicio o deporte.  Siga su plan para [address] de enfermedad cuando no pueda comer o beber como de costumbre.  No fume, no masque tabaco y no use cigarrillos electrnicos.  Las mujeres que no estn embarazadas no deben beber ms de 1 medida de alcohol al Futures trader. Los hombres no deben beber ms de 2 medidas de alcohol por da.  Solo beba alcohol junto con la comida.  Pregntele a su mdico si el alcohol es seguro para usted.  Dgale a su mdico si usted bebe alcohol varias veces durante la semana.  Consulte al mdico con regularidad.  Programe un examen ocular de inmediato despus de que le hayan diagnosticado diabetes. Programe los exmenes una vez al ao.  Revise la piel y [employer]. Examine si hay cortes, moretones, enrojecimiento, problemas en las uas, sangrado, ampollas o llagas. El Recruitment consultant un examen de los pies una vez al ao.  Cepille los dientes y las [address] veces al da. Use el hilo dental una vez al da. Visite a su dentista regularmente.  Comparta su plan de control de diabetes con su trabajo o con la escuela.  Mantngase al da con las vacunas que combaten enfermedades.  Vacnese contra la gripe todos los aos.  Vacnese contra la neumona. Si es mayor de 64 aos y nunca se Estate manager/land agent neumona, tal vez tenga que Mattel vacunas.  Consulte al mdico qu otras vacunas debe aplicarse.  Aprenda a sobrellevar el estrs.  Reciba apoyo y educacin para la diabetes si lo siente necesario.  Pdale ayuda a su mdico si:  Necesita ayuda para Pharmacologist o  mejorar la forma de hacer las cosas por su cuenta.  Necesita ayuda para mantener o mejorar la calidad de vida.  Tiene problemas en el pie o en la mano.  Tiene problemas a la hora de baarse, vestirse, comer o hacer actividad fsica. SOLICITE AYUDA SI:  No puede comer o beber durante ms de 6horas.  Tiene nuseas o vomita durante ms de 6horas.  Su nivel de azcar en la sangre es mayor de 240 mg/dl.  Presenta algn cambio en el estado mental.  Tiene otra enfermedad grave.  Tiene heces lquidas (diarrea) durante ms de 6horas.  Ha estado enfermo o ha tenido fiebre por dos o [address] y no se Aeronautical engineer.  Tiene dolor cuando est activo. SOLICITE AYUDA DE INMEDIATO SI:  Tiene dificultad para respirar.  Los niveles de cetona estn ms altos de lo que el mdico dice que Fish farm manager. ASEGRESE DE QUE:  Comprende estas instrucciones.  Controlar su afeccin.  Recibir ayuda de inmediato si no mejora o si empeora.  Esta informacin no tiene Theme park manager el consejo del mdico. Asegrese de hacerle al mdico cualquier pregunta que tenga.   Document Released: 10/25/2008 Document Revised: 12/13/2014 Elsevier Interactive Patient Education [employer]

## 2015-07-25 NOTE — Assessment & Plan Note (Signed)
Overview He is requesting a refill of pantoprazole 40 mg daily for his heartburn symptoms.  Assessment GERD, well-controlled on medication.  Plan Continue pantoprazole 40 mg

## 2015-07-25 NOTE — Progress Notes (Signed)
   Subjective:    Patient ID: Tempie DonningFranklin Pinckney, male    DOB: 07/25/1961, 54 y.o.   MRN: [id number]  HPI Mr. [name] is a 54 year old Spanish-speaking male with type 2 diabetes, hypertension, morbid obesity who presents today for blood pressure recheck. Please see assessment & plan for status of chronic medical problems.    For his diabetes, he takes metformin 850 mg twice daily.   Review of Systems  Respiratory: Negative for cough and shortness of breath.   Cardiovascular: Negative for chest pain.  Gastrointestinal: Negative for nausea, vomiting, abdominal pain and diarrhea.  Endocrine: Positive for polyuria. Negative for polydipsia.  Neurological: Negative for dizziness.       Objective:   Physical Exam  Constitutional: He is oriented to person, place, and time. He appears well-developed and well-nourished. No distress.  Cardiovascular: Normal rate, regular rhythm, normal heart sounds and intact distal pulses.  Exam reveals no gallop and no friction rub.   No murmur heard. 2+ dorsalis pedis pulses bilaterally.  Pulmonary/Chest: Effort normal and breath sounds normal. No respiratory distress. He has no wheezes. He has no rales.  Abdominal: Soft. Bowel sounds are normal. He exhibits no distension. There is no tenderness. There is no rebound.  Neurological: He is alert and oriented to person, place, and time. No cranial nerve deficit.  Skin: Skin is warm and dry. No rash noted. He is not diaphoretic. No erythema.  Feet without skin defects or lesions bilaterally.  Psychiatric: He has a normal mood and affect. His behavior is normal.          Assessment & Plan:

## 2015-07-26 LAB — BMP8+ANION GAP
Anion Gap: 16 mmol/L (ref 10.0–18.0)
BUN / CREAT RATIO: 12 (ref 9–20)
BUN: 9 mg/dL (ref 6–24)
CHLORIDE: 96 mmol/L (ref 96–106)
CO2: 27 mmol/L (ref 18–29)
Calcium: 9.4 mg/dL (ref 8.7–10.2)
Creatinine, Ser: 0.73 mg/dL — ABNORMAL LOW (ref 0.76–1.27)
GFR calc non Af Amer: 104 mL/min/{1.73_m2} (ref >59)
GFR, EST AFRICAN AMERICAN: 121 mL/min/{1.73_m2} (ref >59)
Glucose: 222 mg/dL — ABNORMAL HIGH (ref 65–99)
Potassium: 4.2 mmol/L (ref 3.5–5.2)
Sodium: 139 mmol/L (ref 134–144)

## 2015-08-01 NOTE — Progress Notes (Signed)
Case discussed with Dr. Patel at the time of the visit.  We reviewed the resident's history and exam and pertinent patient test results.  I agree with the assessment, diagnosis and plan of care documented in the resident's note. 

## 2015-08-18 ENCOUNTER — Ambulatory Visit: Payer: Commercial Managed Care - PPO | Admitting: Internal Medicine

## 2015-08-24 ENCOUNTER — Encounter: Payer: Self-pay | Admitting: Internal Medicine

## 2015-08-24 ENCOUNTER — Ambulatory Visit: Payer: Commercial Managed Care - PPO | Admitting: Internal Medicine

## 2015-10-31 ENCOUNTER — Ambulatory Visit (INDEPENDENT_AMBULATORY_CARE_PROVIDER_SITE_OTHER): Payer: Commercial Managed Care - PPO | Admitting: Internal Medicine

## 2015-10-31 VITALS — BP 110/80 | HR 81 | Temp 98.7°F | Resp 20 | Ht 74.5 in | Wt 317.0 lb

## 2015-10-31 DIAGNOSIS — R05 Cough: Secondary | ICD-10-CM | POA: Diagnosis not present

## 2015-10-31 DIAGNOSIS — J111 Influenza due to unidentified influenza virus with other respiratory manifestations: Secondary | ICD-10-CM

## 2015-10-31 DIAGNOSIS — R509 Fever, unspecified: Secondary | ICD-10-CM | POA: Diagnosis not present

## 2015-10-31 DIAGNOSIS — M5441 Lumbago with sciatica, right side: Secondary | ICD-10-CM

## 2015-10-31 DIAGNOSIS — R059 Cough, unspecified: Secondary | ICD-10-CM

## 2015-10-31 MED ORDER — MELOXICAM 15 MG PO TABS: 15.0000 mg | ORAL_TABLET | Freq: Every day | ORAL | Status: DC

## 2015-10-31 MED ORDER — OSELTAMIVIR PHOSPHATE 75 MG PO CAPS: 75.0000 mg | ORAL_CAPSULE | Freq: Two times a day (BID) | ORAL | Status: DC

## 2015-10-31 MED ORDER — CYCLOBENZAPRINE HCL 10 MG PO TABS: 10.0000 mg | ORAL_TABLET | Freq: Three times a day (TID) | ORAL | Status: DC | PRN

## 2015-10-31 MED ORDER — HYDROCODONE-HOMATROPINE 5-1.5 MG/5ML PO SYRP: 5.0000 mL | ORAL_SOLUTION | Freq: Four times a day (QID) | ORAL | Status: DC | PRN

## 2015-10-31 NOTE — Progress Notes (Signed)
   Subjective:  By signing my name below, I, Essence Howell, attest that this documentation has been prepared under the direction and in the presence of Tonye Pearsonobert P Quartez Lagos, MD Electronically Signed: Charline BillsEssence Howell, ED Scribe 10/31/2015 at 2:49 PM.   Patient ID: Brian Reilly, male    DOB: 01/26/1961, 55 y.o.   MRN: [id number]  Chief Complaint  Patient presents with  . Headache    x 1 days  . Cough  . Back Pain  . Fever   HPI HPI Comments: Brian Reilly is a 55 y.o. male who presents to the [facility] complaining of persistent cough onset yesterday. Pt reports associated fever, rhinorrhea, low back pain that radiates into right leg, HA onset yesterday. Triage temperature 98.7 F. No treatments tried PTA. He denies loss of appetite. Pt reports sick contacts at home with similar symptoms.   Review of Systems  Constitutional: Positive for fever. Negative for appetite change.  HENT: Positive for rhinorrhea.   Respiratory: Positive for cough.   Musculoskeletal: Positive for back pain.  Neurological: Positive for headaches.   BP 110/80 mmHg  Pulse 81  Temp(Src) 98.7 F (37.1 C) (Oral)  Resp 20  Ht 6' 2.5" (1.892 m)  Wt 317 lb (143.79 kg)  BMI 40.17 kg/m2  SpO2 95%    Objective:   Physical Exam  Constitutional: He is oriented to person, place, and time. He appears well-developed and well-nourished. No distress.  HENT:  Head: Normocephalic and atraumatic.  Nose: Rhinorrhea (Copious clear) present.  Mouth/Throat: Posterior oropharyngeal erythema present. No oropharyngeal exudate.  Eyes: Conjunctivae and EOM are normal.  Neck: Neck supple.  Cardiovascular: Normal rate.   Pulmonary/Chest: Effort normal. No respiratory distress.  Musculoskeletal: Normal range of motion.   Straight leg raise on R is positive to 90 degrees with pain in the central lumbar area. Pain across lumbar muscles.   Neurological: He is alert and oriented to person, place, and time.  Skin:  Skin is warm and dry.  Psychiatric: He has a normal mood and affect. His behavior is normal.  Nursing note and vitals reviewed.     Assessment & Plan:  Flu syndrome W/Cough and myalgias and Fever and chills  Bilateral low back pain with right-sided sciatica --aggravated by coughing   Meds ordered this encounter  Medications  . cyclobenzaprine (FLEXERIL) 10 MG tablet    Sig: Take 1 tablet (10 mg total) by mouth 3 (three) times daily as needed for muscle spasms.    Dispense:  30 tablet    Refill:  0  . meloxicam (MOBIC) 15 MG tablet    Sig: Take 1 tablet (15 mg total) by mouth daily. For back pain    Dispense:  30 tablet    Refill:  0  . HYDROcodone-homatropine (HYCODAN) 5-1.5 MG/5ML syrup    Sig: Take 5 mLs by mouth every 6 (six) hours as needed.    Dispense:  120 mL    Refill:  0  . oseltamivir (TAMIFLU) 75 MG capsule    Sig: Take 1 capsule (75 mg total) by mouth 2 (two) times daily.    Dispense:  10 capsule    Refill:  0  f/u 7-10d if back pain not controlled  I have completed the patient encounter in its entirety as documented by the scribe, with editing by me where necessary. Mateya Torti P. Merla Richesoolittle, M.D.

## 2015-10-31 NOTE — Patient Instructions (Signed)
     IF you received an x-ray today, you will receive an invoice from [facility]. Please contact [facility] at [phone] with questions or concerns regarding your invoice.   IF you received labwork today, you will receive an invoice from [facility]. Please contact [facility] at [phone] with questions or concerns regarding your invoice.   Our billing staff will not be able to assist you with questions regarding bills from these companies.  You will be contacted with the lab results as soon as they are available. The fastest way to get your results is to activate your My Chart account. Instructions are located on the last page of this paperwork. If you have not heard from us regarding the results in 2 weeks, please contact this office.      

## 2015-11-01 ENCOUNTER — Ambulatory Visit (INDEPENDENT_AMBULATORY_CARE_PROVIDER_SITE_OTHER): Payer: Commercial Managed Care - PPO | Admitting: Physician Assistant

## 2015-11-01 ENCOUNTER — Encounter: Payer: Self-pay | Admitting: Physician Assistant

## 2015-11-01 VITALS — BP 110/80 | HR 75 | Temp 98.8°F | Resp 18

## 2015-11-01 DIAGNOSIS — R110 Nausea: Secondary | ICD-10-CM

## 2015-11-01 MED ORDER — ONDANSETRON HCL 4 MG PO TABS: 4.0000 mg | ORAL_TABLET | Freq: Three times a day (TID) | ORAL | Status: DC | PRN

## 2015-11-01 MED ORDER — ONDANSETRON 4 MG PO TBDP
8.0000 mg | ORAL_TABLET | Freq: Once | ORAL | Status: AC
  Administered 2015-11-01: 8 mg via ORAL

## 2015-11-01 NOTE — Progress Notes (Signed)
   11/01/2015 1:12 PM   DOB: 05/13/1961 / MRN: [id number]  SUBJECTIVE:  Brian Reilly is a 55 y.o. male presenting for nausea and dizziness.  He was seen here yesterday and diagnosed with the flu.  He has been taking Tamiflu since that time and is not feeling much better yet.  He is tolerating PO and would like something for this.    He has No Known Allergies.   He  has a past medical history of Hyperlipidemia; Abdominal injury; Breast hypertrophy; Chest pain; Shortness of breath; Obesity; GERD (gastroesophageal reflux disease); Sleep apnea; Diaphoresis; Shoulder pain, left; Diabetes mellitus without complication (HCC); and Asthma.    He  reports that he has never smoked. He does not have any smokeless tobacco history on file. He reports that he does not drink alcohol or use illicit drugs. He  reports that he currently engages in sexual activity. The patient  has no past surgical history on file.  His family history is not on file.  Review of Systems  Constitutional: Negative for fever and chills.  Eyes: Negative for blurred vision.  Cardiovascular: Negative for chest pain.  Gastrointestinal: Positive for nausea.  Genitourinary: Negative for dysuria, urgency and frequency.  Musculoskeletal: Negative for myalgias.  Skin: Negative for rash.  Neurological: Negative for dizziness, tingling and headaches.  Psychiatric/Behavioral: Negative for depression. The patient is not nervous/anxious.     Problem list and medications reviewed and updated by myself where necessary, and exist elsewhere in the encounter.   OBJECTIVE:  BP 110/80 mmHg  Pulse 75  Temp(Src) 98.8 F (37.1 C) (Oral)  Resp 18  SpO2 96%  Physical Exam  Constitutional: He is oriented to person, place, and time.  Cardiovascular: Normal rate and regular rhythm.   Pulmonary/Chest: Effort normal and breath sounds normal.  Abdominal: Soft. Bowel sounds are normal. He exhibits no mass. There is no tenderness. There is no  rebound and no guarding.  Musculoskeletal: Normal range of motion.  Neurological: He is alert and oriented to person, place, and time. No cranial nerve deficit.  Vitals reviewed.   No results found for this or any previous visit (from the past 72 hour(s)).  No results found.  ASSESSMENT AND PLAN  Susann GivensFranklin was seen today for nausea.  Diagnoses and all orders for this visit:  Nausea without vomiting: Secondary to flu or tamiflu.  He has improved with Zofran. Rx provided for this and advised he drink.   -     ondansetron (ZOFRAN-ODT) disintegrating tablet 8 mg; Take 2 tablets (8 mg total) by mouth once.   The patient was advised to call or return to clinic if he does not see an improvement in symptoms or to seek the care of the closest emergency department if he worsens with the above plan.   Deliah BostonMichael Bertil Brickey, MHS, PA-C [facility] 11/01/2015 1:12 PM

## 2015-12-03 IMAGING — CR DG SHOULDER 2+V*L*
3 series · 3 of 3 positions shown · non-contrast
Comparison: Chest radiograph 09/04/2014

CLINICAL DATA: Radiculopathy affecting the upper extremity.

EXAM:
LEFT SHOULDER - 2+ VIEW

[shoulder grashey]
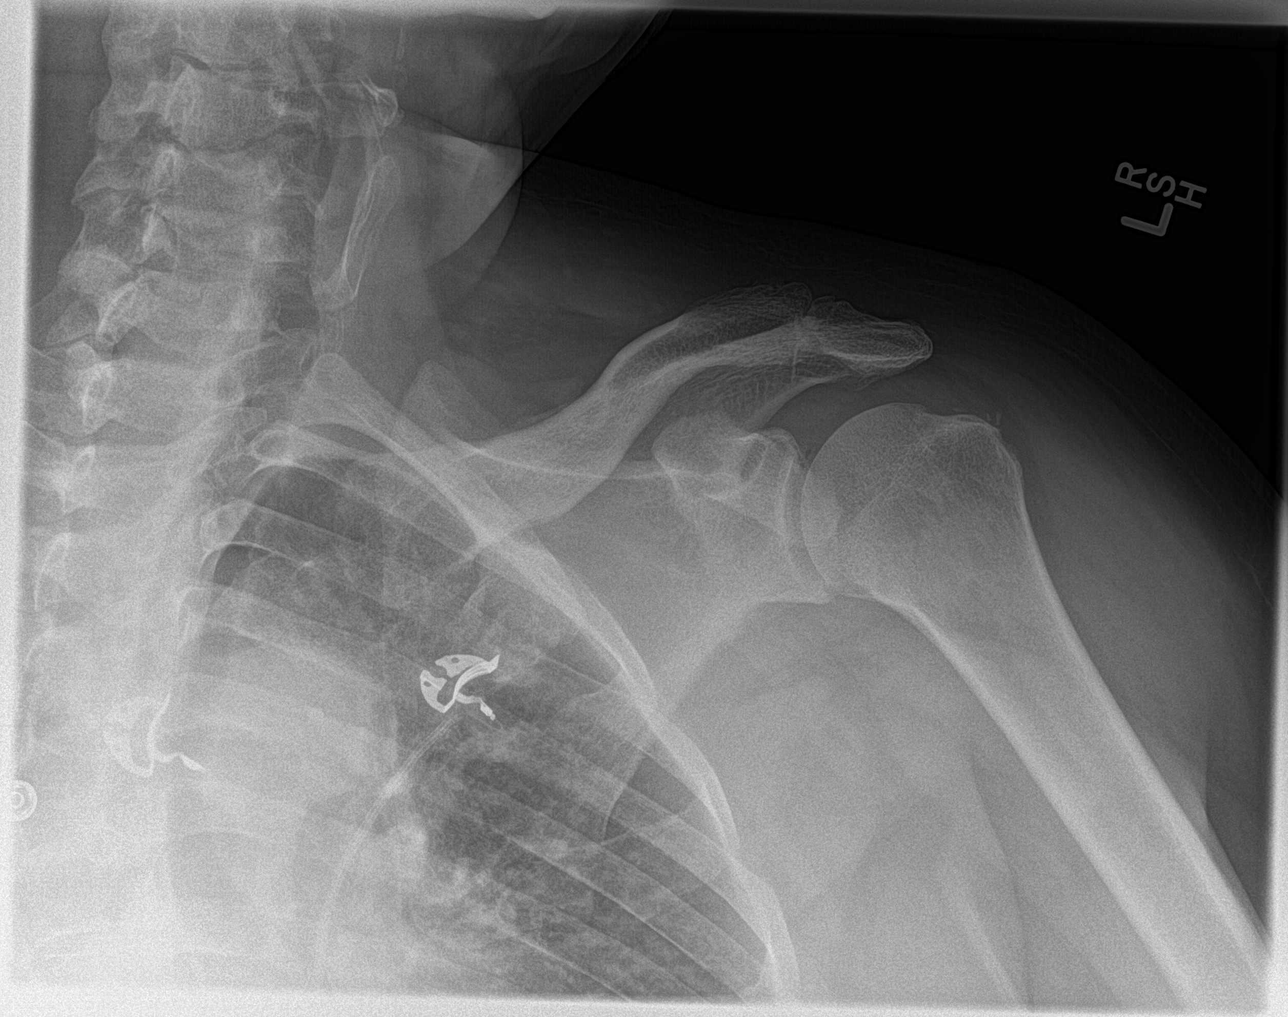

[shoulder y view]
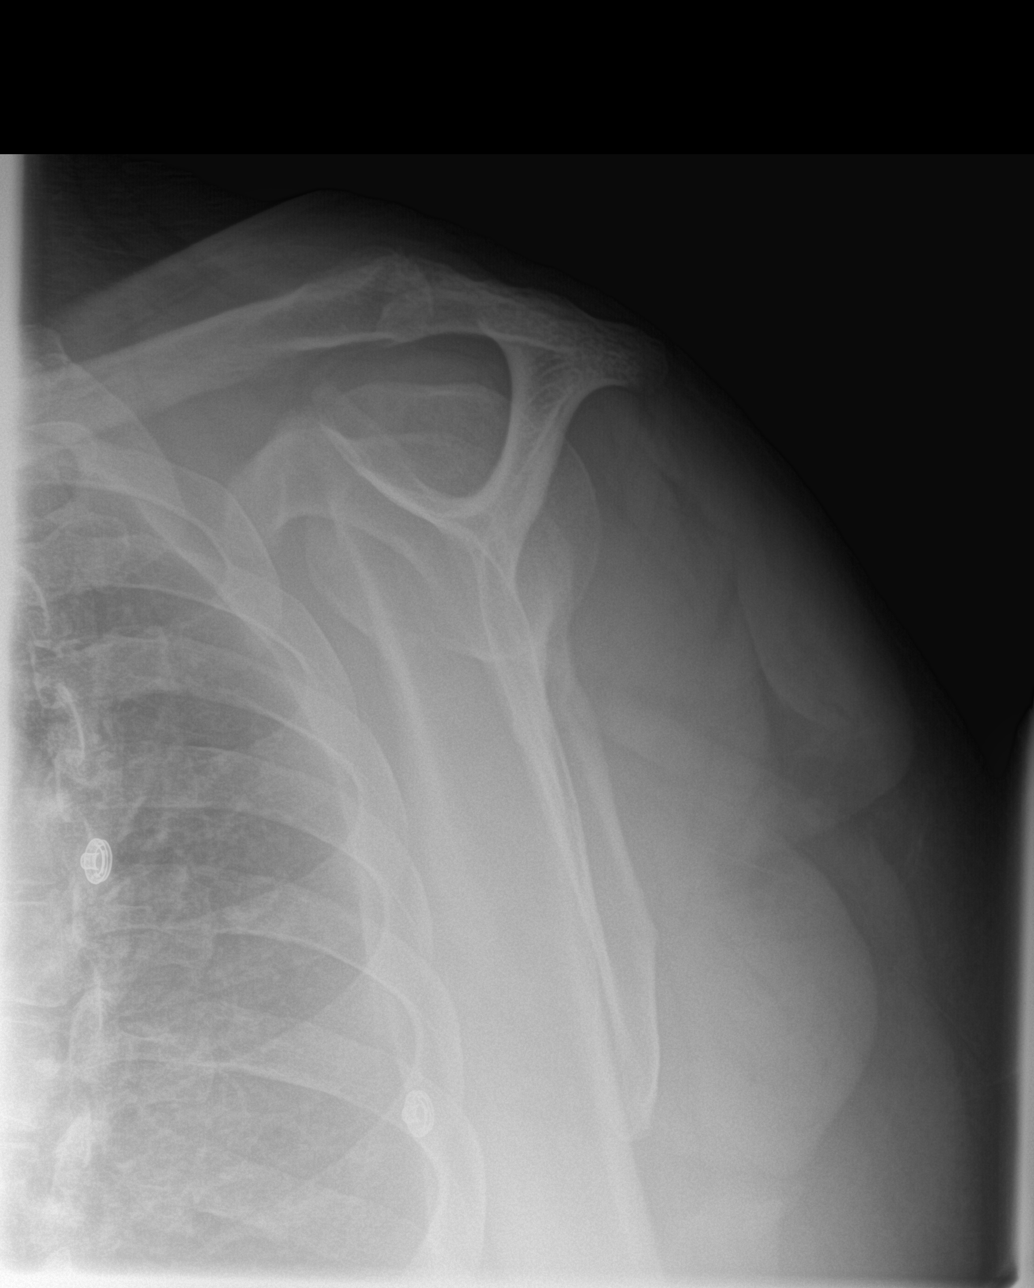

[shoulder axillary]
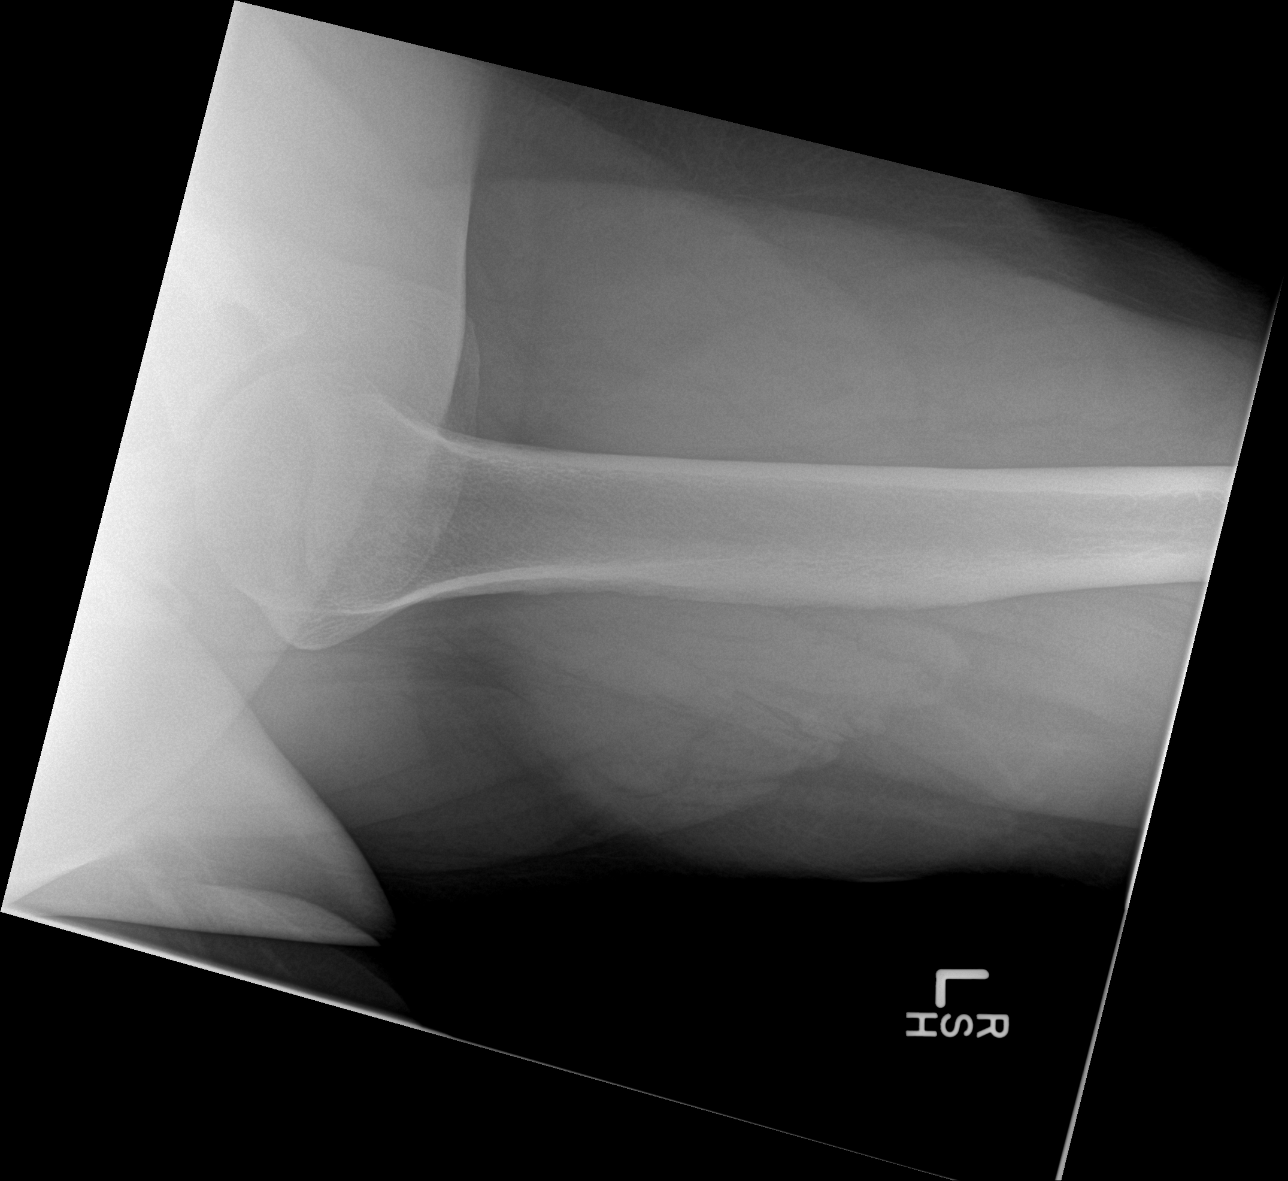

[3 of 3 positions shown; findings below may reference images not displayed]

FINDINGS: Small calcifications above the superior left humerus. The left
shoulder is located without a fracture. Limited evaluation of the
left lung. The left AC joint is intact.
IMPRESSION: No acute bone abnormality to the left shoulder.

Small calcifications in the region of the rotator cuff may represent
mild calcific tendinitis.

## 2015-12-03 IMAGING — CR DG CERVICAL SPINE COMPLETE 4+V
6 series · 6 of 6 positions shown · non-contrast
Comparison: None.

CLINICAL DATA: Radiculopathy. No injury. Neck pain radiating down
the LEFT shoulder.

EXAM:
CERVICAL SPINE  4+ VIEWS

[c-spine lat]
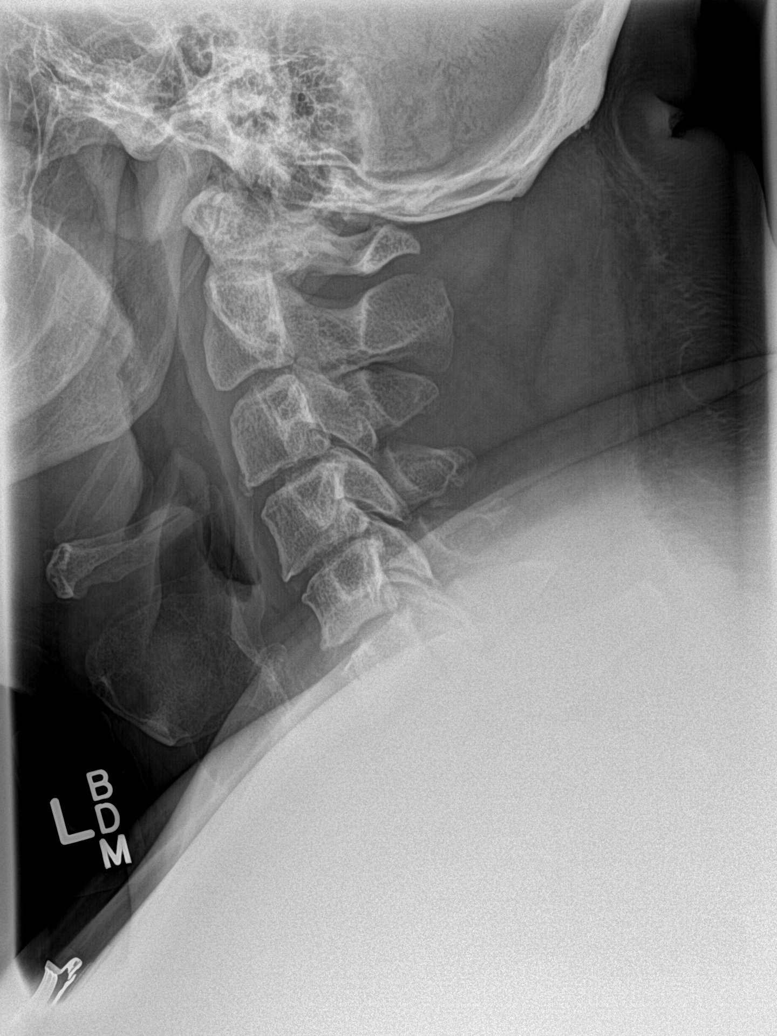

[c-spine obl (1 of 2)]
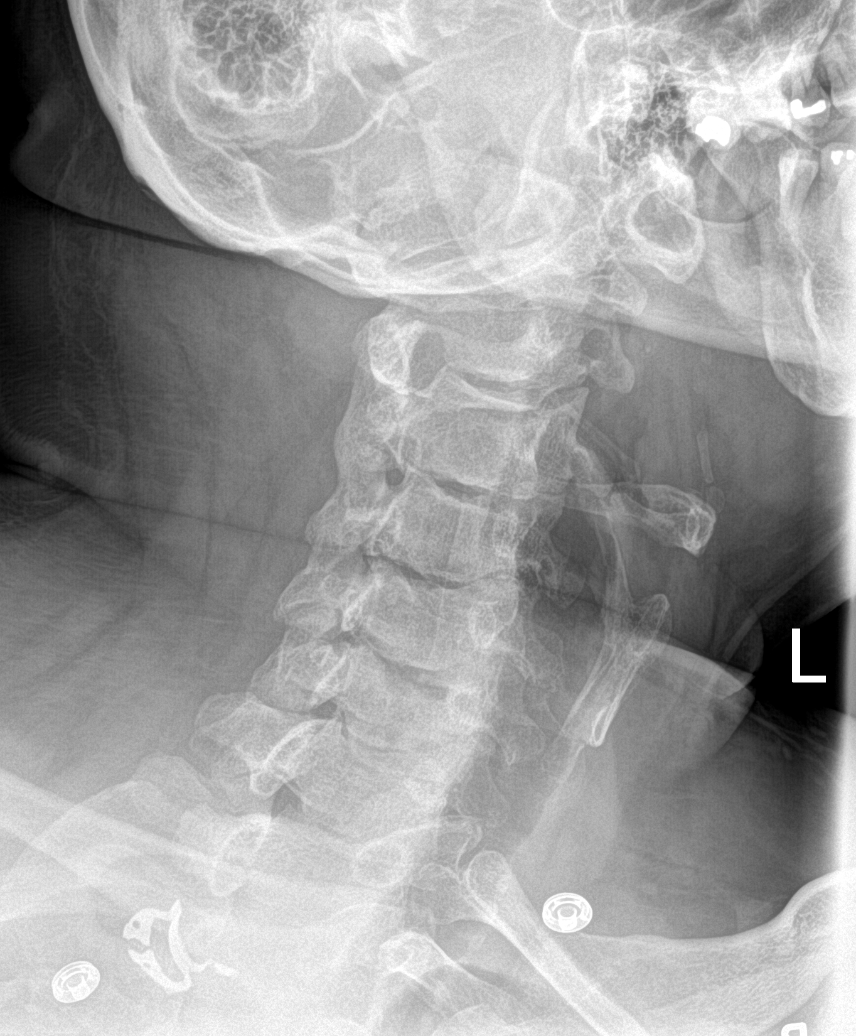

[c-spine obl (2 of 2)]
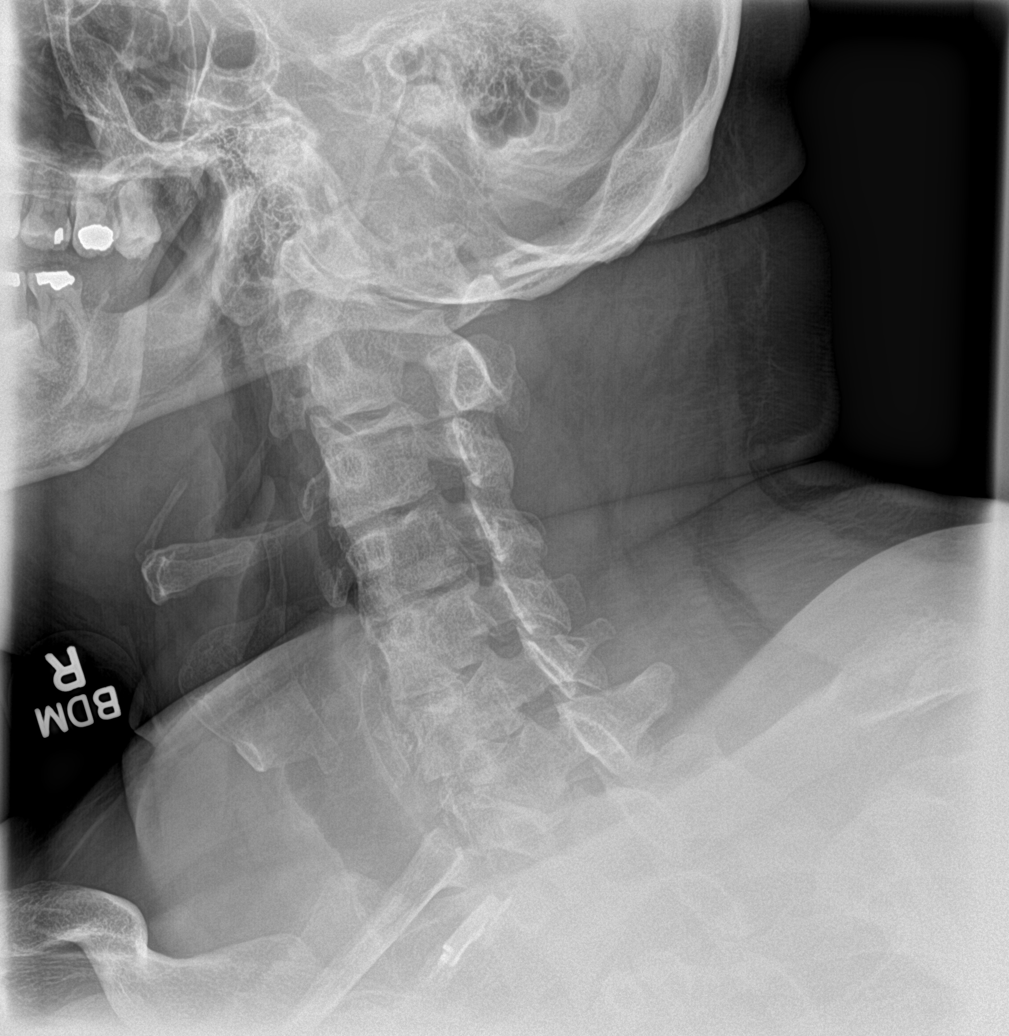

[c-spine ap]
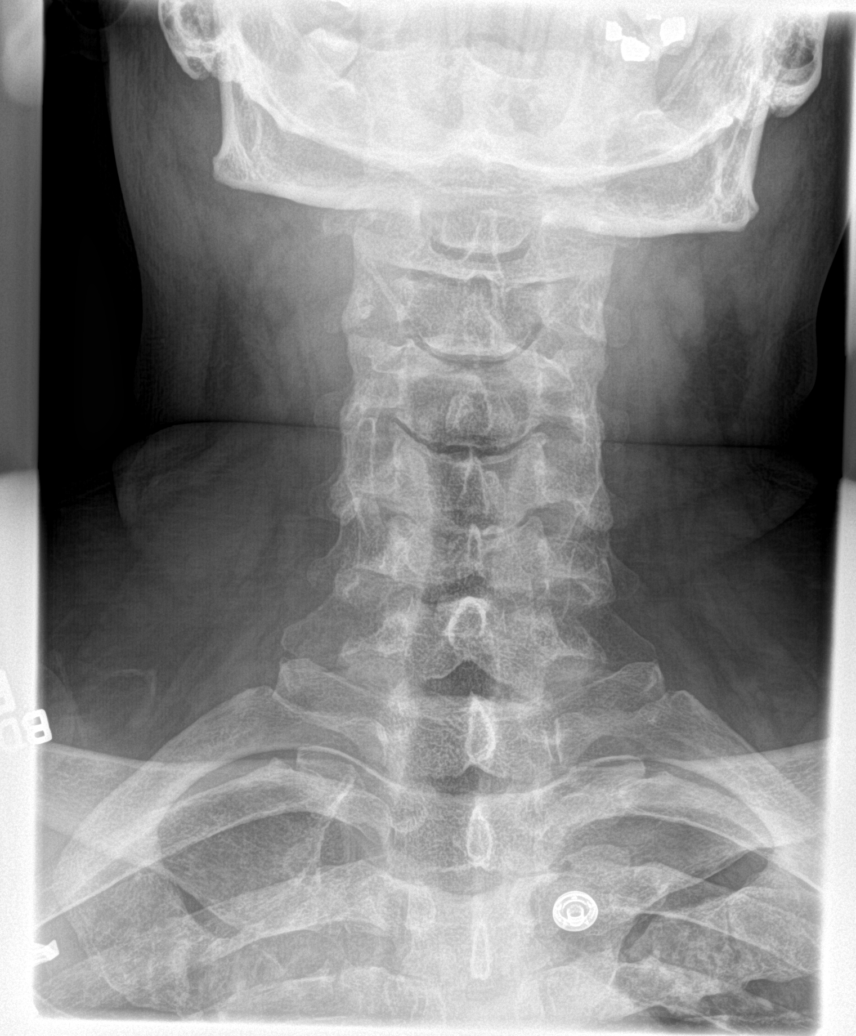

[c-spine open mouth]
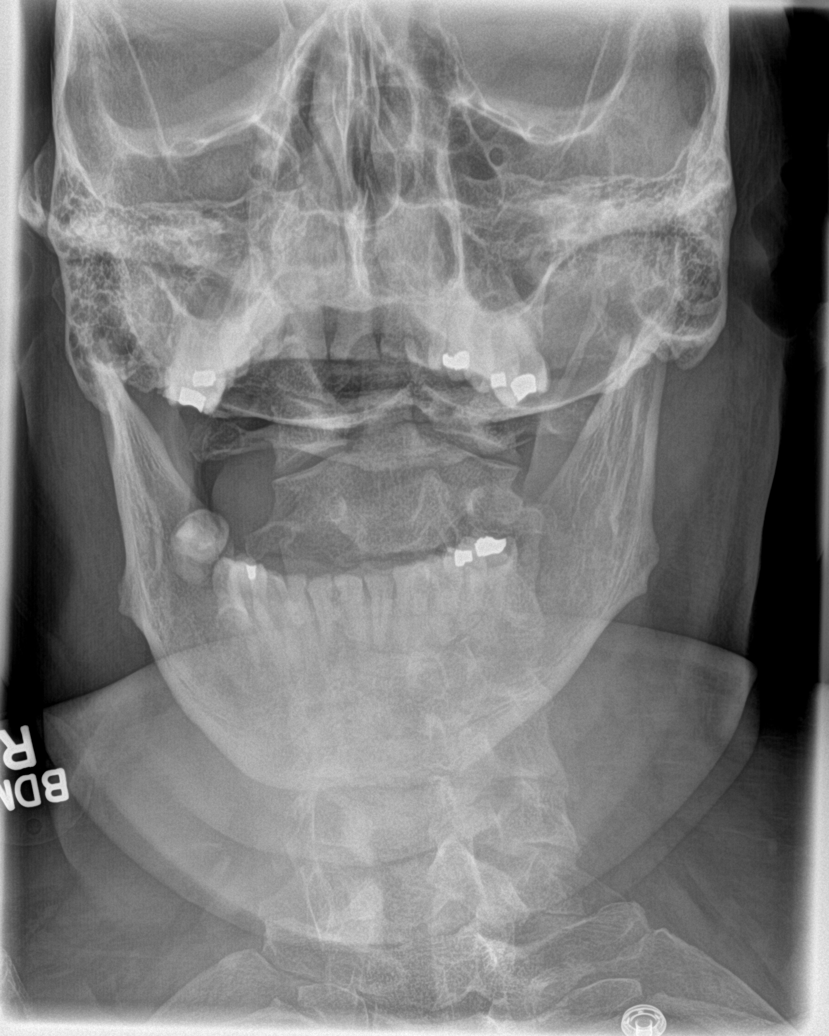

[c-spine swimmers]
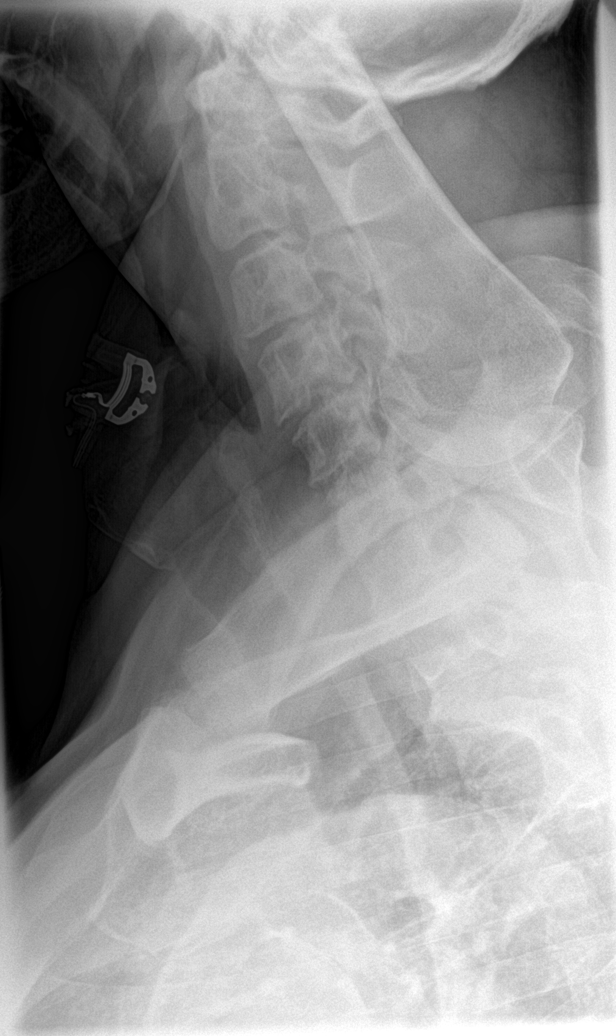

[6 of 6 positions shown; findings below may reference images not displayed]

FINDINGS: This study is technically degraded by obese body habitus.

On the lateral view of the cervical spine, C1 through C6 is visible
and there is no fracture identified. Mid to lower mild-to-moderate
cervical spondylosis is present. The prevertebral soft tissues
appear within normal limits. The oblique images are technically
suboptimal when evaluating the RIGHT neural foramina. The LEFT
neural foramina appear adequately patent. Odontoid appears within
normal limits.

Swimmer's view is submitted for interpretation and the
cervicothoracic junction appears within normal limits. The alignment
appears within normal limits on frontal and lateral views.
IMPRESSION: Mild to moderate mid to lower cervical spondylosis. No acute osseous
abnormality.

## 2016-08-01 ENCOUNTER — Other Ambulatory Visit: Payer: Self-pay | Admitting: Internal Medicine

## 2016-08-01 DIAGNOSIS — E1165 Type 2 diabetes mellitus with hyperglycemia: Secondary | ICD-10-CM

## 2016-08-01 DIAGNOSIS — I10 Essential (primary) hypertension: Secondary | ICD-10-CM

## 2016-08-01 DIAGNOSIS — IMO0001 Reserved for inherently not codable concepts without codable children: Secondary | ICD-10-CM

## 2016-08-02 ENCOUNTER — Ambulatory Visit (INDEPENDENT_AMBULATORY_CARE_PROVIDER_SITE_OTHER): Payer: Commercial Managed Care - PPO | Admitting: Family Medicine

## 2016-08-02 VITALS — BP 148/91 | HR 67 | Temp 98.5°F | Resp 18 | Ht 73.5 in | Wt 329.8 lb

## 2016-08-02 DIAGNOSIS — K219 Gastro-esophageal reflux disease without esophagitis: Secondary | ICD-10-CM

## 2016-08-02 DIAGNOSIS — M5432 Sciatica, left side: Secondary | ICD-10-CM

## 2016-08-02 DIAGNOSIS — E1165 Type 2 diabetes mellitus with hyperglycemia: Secondary | ICD-10-CM | POA: Diagnosis not present

## 2016-08-02 DIAGNOSIS — G4733 Obstructive sleep apnea (adult) (pediatric): Secondary | ICD-10-CM | POA: Diagnosis not present

## 2016-08-02 DIAGNOSIS — I10 Essential (primary) hypertension: Secondary | ICD-10-CM

## 2016-08-02 DIAGNOSIS — Z0000 Encounter for general adult medical examination without abnormal findings: Secondary | ICD-10-CM

## 2016-08-02 DIAGNOSIS — E119 Type 2 diabetes mellitus without complications: Secondary | ICD-10-CM

## 2016-08-02 DIAGNOSIS — R05 Cough: Secondary | ICD-10-CM

## 2016-08-02 DIAGNOSIS — IMO0001 Reserved for inherently not codable concepts without codable children: Secondary | ICD-10-CM

## 2016-08-02 DIAGNOSIS — R059 Cough, unspecified: Secondary | ICD-10-CM

## 2016-08-02 MED ORDER — ONETOUCH DELICA LANCETS FINE MISC: 1.0000 | Freq: Every day | 5 refills | Status: DC

## 2016-08-02 MED ORDER — LISINOPRIL 5 MG PO TABS: 5.0000 mg | ORAL_TABLET | Freq: Every day | ORAL | 3 refills | Status: DC

## 2016-08-02 MED ORDER — CYCLOBENZAPRINE HCL 10 MG PO TABS: 10.0000 mg | ORAL_TABLET | Freq: Three times a day (TID) | ORAL | 0 refills | Status: DC | PRN

## 2016-08-02 MED ORDER — GLUCOSE BLOOD VI STRP: ORAL_STRIP | 12 refills | Status: DC

## 2016-08-02 MED ORDER — PANTOPRAZOLE SODIUM 40 MG PO TBEC: 40.0000 mg | DELAYED_RELEASE_TABLET | Freq: Every day | ORAL | 3 refills | Status: DC

## 2016-08-02 MED ORDER — HYDROCHLOROTHIAZIDE 12.5 MG PO CAPS: 12.5000 mg | ORAL_CAPSULE | Freq: Every day | ORAL | 3 refills | Status: DC

## 2016-08-02 MED ORDER — METFORMIN HCL 1000 MG PO TABS: 1000.0000 mg | ORAL_TABLET | Freq: Two times a day (BID) | ORAL | 3 refills | Status: DC

## 2016-08-02 MED ORDER — BENZONATATE 100 MG PO CAPS: 100.0000 mg | ORAL_CAPSULE | Freq: Three times a day (TID) | ORAL | 0 refills | Status: DC | PRN

## 2016-08-02 MED ORDER — SIMVASTATIN 40 MG PO TABS: 40.0000 mg | ORAL_TABLET | Freq: Every evening | ORAL | 3 refills | Status: DC

## 2016-08-02 NOTE — Patient Instructions (Addendum)
Work hard on trying to lose weight. You should try and get some daily exercise by taking a walk. Work hard on eating less. I recommend reading on the American diabetic Association website (diabetes.org) and learning as much about diabetes and taking care of yourself as possible.  I'm increasing the dose of your blood pressure medicine to lisinopril 5 mg  Take azithromycin 2 pills initially, then 1 daily for the next 4 days for the cough  You should hear from somebody regarding the referral to a sleep specialist. If you do not hear from our office in the next 10 days on that please call back and speak to the referral's desk.  Take Tylenol (acetaminophen) extra strength 500 mg 2 pills 3 times daily as needed for the leg pain. If it continues to persist you may need further evaluation of your back, because this is suspicious for a sciatica pain from a nerve being pinched in your back causing the pain down the leg.  Plan to return in 4-6 months to get rechecked on your blood pressure and diabetes  In the next week or 10 days we will let you know the results of your laboratory tests and whether anything else needs to be done differently with regard to them.  IF you received an x-ray today, you will receive an invoice from [facility]. Please contact [facility] at [phone] with questions or concerns regarding your invoice.   IF you received labwork today, you will receive an invoice from [facility]. Please contact [facility] at [phone] with questions or concerns regarding your invoice.   Our billing staff will not be able to assist you with questions regarding bills from these companies.  You will be contacted with the lab results as soon as they are available. The fastest way to get your results is to activate your My Chart account. Instructions are located on the last page of this paperwork. If you have not heard from us regarding the results in 2 weeks, please contact this  office.

## 2016-08-02 NOTE — Progress Notes (Signed)
Patient ID: Brian Reilly, male    DOB: 01/01/1961  Age: 55 y.o. MRN: [id number]  Cough Left leg pains  Subjective:   Patient has had a persistent cough which has been very slow resolving. Is coughing up some phlegm.  He's been having for several weeks some pain down his left leg along the lateral aspect down to his foot. No known injury.  Current allergies, medications, problem list, past/family and social histories reviewed.  Objective:  BP (!) 148/91 (BP Location: Right Arm, Patient Position: Sitting, Cuff Size: Large)   Pulse 67   Temp 98.5 F (36.9 C) (Oral)   Resp 18   Ht 6' 1.5" (1.867 m)   Wt (!) 329 lb 12.8 oz (149.6 kg)   SpO2 96%   BMI 42.92 kg/m   Straight leg raise test was negative. Chest was clear to auscultation. Heart regular without murmur.  Assessment & Plan:   Assessment: Cough Left lumbosacral radiculopathy   Plan: Treatment options are limited for the left leg. Use steroids because of his diabetes, and nonsteroidals are not good for him with his stomach and diabetes. Take Tylenol. If he keeps having symptoms may need to refer him for further evaluation. Given her muscle relaxant.  Treat his cough with antibiotics and cough medication.  Orders Placed This Encounter  Procedures  . CBC  . Comprehensive metabolic panel  . Hemoglobin A1c  . TSH  . Lipid panel  . Ambulatory referral to Sleep Studies    Referral Priority:   Urgent    Referral Type:   Consultation    Referral Reason:   Specialty Services Required    Number of Visits Requested:   1    Meds ordered this encounter  Medications  . DISCONTD: metFORMIN (GLUCOPHAGE) 1000 MG tablet    Sig: Take 1,000 mg by mouth 2 (two) times daily with a meal.  . metFORMIN (GLUCOPHAGE) 1000 MG tablet    Sig: Take 1 tablet (1,000 mg total) by mouth 2 (two) times daily with a meal.    Dispense:  180 tablet    Refill:  3  . ONETOUCH DELICA LANCETS FINE MISC    Sig: 1 each by Does not apply route daily  before breakfast. Dx code 250.00    Dispense:  100 each    Refill:  5  . glucose blood (ONE TOUCH ULTRA TEST) test strip    Sig: Check blood sugar every morning before you eat or drink anything dx code 250.00    Dispense:  50 each    Refill:  12  . pantoprazole (PROTONIX) 40 MG tablet    Sig: Take 1 tablet (40 mg total) by mouth daily.    Dispense:  90 tablet    Refill:  3  . hydrochlorothiazide (MICROZIDE) 12.5 MG capsule    Sig: Take 1 capsule (12.5 mg total) by mouth daily.    Dispense:  90 capsule    Refill:  3  . simvastatin (ZOCOR) 40 MG tablet    Sig: Take 1 tablet (40 mg total) by mouth every evening.    Dispense:  90 tablet    Refill:  3  . lisinopril (PRINIVIL,ZESTRIL) 5 MG tablet    Sig: Take 1 tablet (5 mg total) by mouth daily.    Dispense:  90 tablet    Refill:  3  . cyclobenzaprine (FLEXERIL) 10 MG tablet    Sig: Take 1 tablet (10 mg total) by mouth 3 (three) times daily as needed for muscle  spasms.    Dispense:  30 tablet    Refill:  0  . benzonatate (TESSALON) 100 MG capsule    Sig: Take 1-2 capsules (100-200 mg total) by mouth 3 (three) times daily as needed.    Dispense:  30 capsule    Refill:  0         Patient Instructions    Work hard on trying to lose weight. You should try and get some daily exercise by taking a walk. Work hard on eating less. I recommend reading on the American diabetic Association website (diabetes.org) and learning as much about diabetes and taking care of yourself as possible.  I'm increasing the dose of your blood pressure medicine to lisinopril 5 mg  Take azithromycin 2 pills initially, then 1 daily for the next 4 days for the cough  You should hear from somebody regarding the referral to a sleep specialist. If you do not hear from our office in the next 10 days on that please call back and speak to the referral's desk.  Take Tylenol (acetaminophen) extra strength 500 mg 2 pills 3 times daily as needed for the leg pain. If  it continues to persist you may need further evaluation of your back, because this is suspicious for a sciatica pain from a nerve being pinched in your back causing the pain down the leg.  Plan to return in 4-6 months to get rechecked on your blood pressure and diabetes  In the next week or 10 days we will let you know the results of your laboratory tests and whether anything else needs to be done differently with regard to them.  IF you received an x-ray today, you will receive an invoice from [facility]. Please contact [facility] at [phone] with questions or concerns regarding your invoice.   IF you received labwork today, you will receive an invoice from [facility]. Please contact [facility] at [phone] with questions or concerns regarding your invoice.   Our billing staff will not be able to assist you with questions regarding bills from these companies.  You will be contacted with the lab results as soon as they are available. The fastest way to get your results is to activate your My Chart account. Instructions are located on the last page of this paperwork. If you have not heard from us regarding the results in 2 weeks, please contact this office.         Return in about 6 months (around 01/31/2017).   Kailer Heindel, MD 08/02/2016
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
## 2016-08-04 LAB — COMPREHENSIVE METABOLIC PANEL
ALBUMIN: 4.2 g/dL (ref 3.5–5.5)
ALT: 40 IU/L (ref 0–44)
AST: 20 IU/L (ref 0–40)
Albumin/Globulin Ratio: 1.6 (ref 1.2–2.2)
Alkaline Phosphatase: 86 IU/L (ref 39–117)
BILIRUBIN TOTAL: 0.2 mg/dL (ref 0.0–1.2)
BUN / CREAT RATIO: 14 (ref 9–20)
BUN: 10 mg/dL (ref 6–24)
CALCIUM: 9.2 mg/dL (ref 8.7–10.2)
CO2: 22 mmol/L (ref 18–29)
CREATININE: 0.74 mg/dL — AB (ref 0.76–1.27)
Chloride: 99 mmol/L (ref 96–106)
GFR, EST AFRICAN AMERICAN: 119 mL/min/{1.73_m2} (ref >59)
GFR, EST NON AFRICAN AMERICAN: 103 mL/min/{1.73_m2} (ref >59)
Globulin, Total: 2.7 g/dL (ref 1.5–4.5)
Glucose: 110 mg/dL — ABNORMAL HIGH (ref 65–99)
Potassium: 4 mmol/L (ref 3.5–5.2)
Sodium: 142 mmol/L (ref 134–144)
TOTAL PROTEIN: 6.9 g/dL (ref 6.0–8.5)

## 2016-08-04 LAB — CBC
HEMOGLOBIN: 15.8 g/dL (ref 13.0–17.7)
Hematocrit: 47.7 % (ref 37.5–51.0)
MCH: 27.1 pg (ref 26.6–33.0)
MCHC: 33.1 g/dL (ref 31.5–35.7)
MCV: 82 fL (ref 79–97)
PLATELETS: 301 10*3/uL (ref 150–379)
RBC: 5.82 x10E6/uL — AB (ref 4.14–5.80)
RDW: 13.9 % (ref 12.3–15.4)
WBC: 12.3 10*3/uL — AB (ref 3.4–10.8)

## 2016-08-04 LAB — LIPID PANEL
CHOLESTEROL TOTAL: 144 mg/dL (ref 100–199)
Chol/HDL Ratio: 3.9 ratio units (ref 0.0–5.0)
HDL: 37 mg/dL — AB (ref >39)
LDL CALC: 68 mg/dL (ref 0–99)
TRIGLYCERIDES: 193 mg/dL — AB (ref 0–149)
VLDL CHOLESTEROL CAL: 39 mg/dL (ref 5–40)

## 2016-08-04 LAB — TSH: TSH: 1.19 u[IU]/mL (ref 0.450–4.500)

## 2016-08-04 LAB — HEMOGLOBIN A1C
Est. average glucose Bld gHb Est-mCnc: 151 mg/dL
Hgb A1c MFr Bld: 6.9 % — ABNORMAL HIGH (ref 4.8–5.6)

## 2016-08-13 ENCOUNTER — Encounter: Payer: Self-pay | Admitting: Emergency Medicine

## 2016-08-29 ENCOUNTER — Institutional Professional Consult (permissible substitution): Payer: Commercial Managed Care - PPO | Admitting: Neurology

## 2016-08-30 ENCOUNTER — Telehealth: Payer: Self-pay

## 2016-08-30 NOTE — Telephone Encounter (Signed)
LM for patient to call back and r/s.

## 2016-08-30 NOTE — Telephone Encounter (Signed)
I called the patient, from home on Wednesday (1/17), and advised the pt that the office is closed and we will call back to reschedule (due to the snow). Patient voiced understanding. 

## 2016-09-25 ENCOUNTER — Encounter: Payer: Self-pay | Admitting: Neurology

## 2016-09-25 ENCOUNTER — Ambulatory Visit (INDEPENDENT_AMBULATORY_CARE_PROVIDER_SITE_OTHER): Payer: Commercial Managed Care - PPO | Admitting: Neurology

## 2016-09-25 VITALS — BP 154/96 | HR 70 | Resp 20 | Ht 74.0 in | Wt 323.0 lb

## 2016-09-25 DIAGNOSIS — R51 Headache: Secondary | ICD-10-CM | POA: Diagnosis not present

## 2016-09-25 DIAGNOSIS — G2581 Restless legs syndrome: Secondary | ICD-10-CM

## 2016-09-25 DIAGNOSIS — R519 Headache, unspecified: Secondary | ICD-10-CM

## 2016-09-25 DIAGNOSIS — G4733 Obstructive sleep apnea (adult) (pediatric): Secondary | ICD-10-CM

## 2016-09-25 NOTE — Patient Instructions (Signed)
Based on your symptoms and your exam I believe you still have significant obstructive sleep apnea or OSA, and I think we should proceed with a sleep study to determine how severe it is. If you have more than mild OSA, I want you to consider treatment with CPAP. Please remember, the risks and ramifications of moderate to severe obstructive sleep apnea or OSA are: Cardiovascular disease, including congestive heart failure, stroke, difficult to control hypertension, arrhythmias, and even type 2 diabetes has been linked to untreated OSA. Sleep apnea causes disruption of sleep and sleep deprivation in most cases, which, in turn, can cause recurrent headaches, problems with memory, mood, concentration, focus, and vigilance. Most people with untreated sleep apnea report excessive daytime sleepiness, which can affect their ability to drive. Please do not drive if you feel sleepy.   I will likely see you back after your sleep study to go over the test results and where to go from there. We will call you after your sleep study to advise about the results (most likely, you will hear from Diana, my nurse) and to set up an appointment at the time, as necessary.    Our sleep lab administrative assistant, Dawn will meet with you or call you to schedule your sleep study. If you don't hear back from her by next week please feel free to call her at [phone]. This is her direct line and please leave a message with your phone number to call back if you get the voicemail box. She will call back as soon as possible.   
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
## 2016-10-09 ENCOUNTER — Ambulatory Visit (INDEPENDENT_AMBULATORY_CARE_PROVIDER_SITE_OTHER): Payer: Commercial Managed Care - PPO | Admitting: Neurology

## 2016-10-09 DIAGNOSIS — G4720 Circadian rhythm sleep disorder, unspecified type: Secondary | ICD-10-CM

## 2016-10-09 DIAGNOSIS — G4733 Obstructive sleep apnea (adult) (pediatric): Secondary | ICD-10-CM

## 2016-10-14 NOTE — Procedures (Signed)
PATIENT'S NAME:  Brian Reilly, Brian Reilly DOB:      December 07, 1960      MR#:    [id number]     DATE OF RECORDING: 10/09/2016 REFERRING M.D.:  Janace Hoard, MD Study Performed:   Baseline Polysomnogram HISTORY: 56 year old man with a history of type 2 diabetes, reflux disease, hypertension, chronic cough, sciatica, and morbid obesity, who was previously diagnosed with obstructive sleep apnea about 10 years ago. His CPAP machine is broken. Epworth Sleepiness Scale score is 2/24. The patient's weight 323 pounds with a height of 74 (inches), resulting in a BMI of 41.6 kg/m2. The patient's neck circumference measured 19 inches.  CURRENT MEDICATIONS: Aspirin, Tessalon, Flexeril, Microzide, Lisinopril, Metformin, Mobic, Protonix, Zocor   PROCEDURE:  This is a multichannel digital polysomnogram utilizing the Somnostar 11.2 system.  Electrodes and sensors were applied and monitored per AASM Specifications.   EEG, EOG, Chin and Limb EMG, were sampled at 200 Hz.  ECG, Snore and Nasal Pressure, Thermal Airflow, Respiratory Effort, CPAP Flow and Pressure, Oximetry was sampled at 50 Hz. Digital video and audio were recorded.      BASELINE STUDY  Lights Out was at 22:36 and Lights On at 05:14.  Total recording time (TRT) was 398.5 minutes, with a total sleep time (TST) of  321 minutes.   The patient's sleep latency was 30.5 minutes, which is prolonged.  REM latency was 145 minutes, which is mildly prolonged.  The sleep efficiency was 80.6 %.     SLEEP ARCHITECTURE: WASO (Wake after sleep onset) was 68 minutes with moderate sleep fragmentation noted.  There were 39.5 minutes in Stage N1, 210.5 minutes Stage N2, 18 minutes Stage N3 and 53 minutes in Stage REM.  The percentage of Stage N1 was 12.3%, which is increased, Stage N2 was 65.6%, which is increased, Stage N3 was 5.6% and Stage R (REM sleep) was 16.5%, which is mildly reduced.   The arousals were noted as: 47 were spontaneous, 0 were associated with PLMs, 32 were  associated with respiratory events.    Audio and video analysis did not show any abnormal or unusual movements, behaviors, phonations or vocalizations.  The patient took no bathroom breaks. Mild snoring was noted. The EKG was in keeping with normal sinus rhythm (NSR).  RESPIRATORY ANALYSIS:  There were a total of 82 respiratory events:  1 obstructive apneas, 9 central apneas and 1 mixed apneas with a total of 11 apneas and an apnea index (AI) of 2.1 /hour. There were 71 hypopneas with a hypopnea index of 13.3 /hour. The patient also had 13 respiratory event related arousals (RERAs).      The total APNEA/HYPOPNEA INDEX (AHI) was 15.3/hour and the total RESPIRATORY DISTURBANCE INDEX was 17.8 /hour.  23 events occurred in REM sleep and 114 events in NREM. The REM AHI was 26. /hour, versus a non-REM AHI of 13.2. The patient spent 62.5 minutes of total sleep time in the supine position and 259 minutes in non-supine.. The supine AHI was 41.3 versus a non-supine AHI of 9.1.  OXYGEN SATURATION & C02:  The Wake baseline 02 saturation was 92%, with the lowest being 75%. Time spent below 89% saturation equaled 55 minutes.  PERIODIC LIMB MOVEMENTS:  The patient had a total of 0 Periodic Limb Movements.  The Periodic Limb Movement (PLM) index was 0 and the PLM Arousal index was 0/hour.    Post-study, the patient indicated that sleep was worse than usual.  IMPRESSION: 1. Obstructive Sleep Apnea (OSA) 2. Dysfunctions associated with  sleep stages or arousal from sleep  RECOMMENDATIONS:  1. This overnight polysomnogram demonstrates overall moderate obstructive sleep apnea, severe during supine sleep. Given the patient's medical history and moderate degree of OSA, treatment with positive airway pressure (in the form of CPAP) is recommended and is ideally is achieved during a full night CPAP titration study for proper treatment settings and mask fitting. Other treatment options may include: avoidance of the supine  sleep position, weight loss, an oral appliance (aka dental device, custom made by a specialized dentist usually), or upper airway or jaw surgery (not usually first line treatments).  2. Please note that untreated obstructive sleep apnea carries additional perioperative morbidity. Patients with significant obstructive sleep apnea should receive perioperative PAP therapy and the surgeons and particularly the anesthesiologist should be informed of the diagnosis and the severity of the sleep disordered breathing. 3. This study shows sleep fragmentation and abnormal sleep stage percentages; these are nonspecific findings and per se do not signify an intrinsic sleep disorder or a cause for the patient's sleep-related symptoms. Causes include (but are not limited to) the first night effect of the sleep study, circadian rhythm disturbances, medication effect or an underlying mood disorder or medical problem.  4. The patient should be cautioned not to drive, work at heights, or operate dangerous or heavy equipment when tired or sleepy. Review and reiteration of good sleep hygiene measures should be pursued with any patient. 5. The patient will be [facility] in [facility] for discussion of the test results and further management strategies. The referring provider will be notified of the test results.  I certify that I have reviewed the entire raw data recording prior to the issuance of this report in accordance with the Standards of Accreditation of the American Academy of Sleep Medicine (AASM)    Huston FoleySaima Annebelle Bostic, MD, PhD Diplomat, American Board of Psychiatry and Neurology (Neurology and Sleep Medicine)

## 2016-10-14 NOTE — Addendum Note (Signed)
Addended by: Huston FoleyATHAR, Margaret Cockerill on: 10/14/2016 07:49 AM   Modules accepted: Orders

## 2016-10-14 NOTE — Progress Notes (Signed)
Patient referred by Dr. Alwyn RenHopper, seen by me on 09/25/16, diagnostic PSG on 10/09/16.    Please call and notify the patient that the recent sleep study did confirm the diagnosis of mod to severe obstructive sleep apnea and that I recommend treatment for this in the form of CPAP. This will require a repeat sleep study for proper titration and mask fitting. Please explain to patient and arrange for a CPAP titration study. I have placed an order in the chart. Thanks, and [facility].  Huston FoleySaima Rashard Ryle, MD, PhD [facility])

## 2016-10-18 ENCOUNTER — Telehealth: Payer: Self-pay

## 2016-10-18 NOTE — Telephone Encounter (Signed)
LM (per DPR) with results below. Dawn has called patient to schedule but I had not given results yet. I advised that I will let Dawn know that I left results on VM. Left call back number for any further questions.  Report sent to PCP.

## 2016-10-18 NOTE — Telephone Encounter (Signed)
-----   Message from Huston FoleySaima Athar, MD sent at 10/14/2016  7:49 AM EST ----- Patient referred by Dr. Alwyn RenHopper, seen by me on 09/25/16, diagnostic PSG on 10/09/16.    Please call and notify the patient that the recent sleep study did confirm the diagnosis of mod to severe obstructive sleep apnea and that I recommend treatment for this in the form of CPAP. This will require a repeat sleep study for proper titration and mask fitting. Please explain to patient and arrange for a CPAP titration study. I have placed an order in the chart. Thanks, and [facility].  Huston FoleySaima Athar, MD, PhD [facility])

## 2016-11-06 ENCOUNTER — Ambulatory Visit (INDEPENDENT_AMBULATORY_CARE_PROVIDER_SITE_OTHER): Payer: Commercial Managed Care - PPO | Admitting: Neurology

## 2016-11-06 DIAGNOSIS — G4733 Obstructive sleep apnea (adult) (pediatric): Secondary | ICD-10-CM | POA: Diagnosis not present

## 2016-11-06 DIAGNOSIS — G4720 Circadian rhythm sleep disorder, unspecified type: Secondary | ICD-10-CM

## 2016-11-08 NOTE — Progress Notes (Signed)
Patient referred by Dr. Alwyn Ren, seen by me on 09/25/16, diagnostic PSG on 10/09/16, CPAP study on 11/06/16. Please call and inform patient that I have entered an order for treatment with positive airway pressure (PAP) treatment of obstructive sleep apnea (OSA). He did well during the latest sleep study with CPAP. We will, therefore, arrange for a machine for home use through a DME (durable medical equipment) company of His choice; and I will see the patient back in follow-up in about 8-10 weeks. Please also explain to the patient that I will be looking out for compliance data, which can be downloaded from the machine (stored on an SD card, that is inserted in the machine) or via remote access through a modem, that is built into the machine. At the time of the followup appointment we will discuss sleep study results and how it is going with PAP treatment at home. Please advise patient to bring His machine at the time of the first FU visit, even though this is cumbersome. Bringing the machine for every visit after that will likely not be needed, but often helps for the first visit to troubleshoot if needed. Please re-enforce the importance of compliance with treatment and the need for [place] to monitor compliance data - often an insurance requirement and actually good feedback for the patient as far as how they are doing.  Also remind patient, that any interim PAP machine or mask issues should be first addressed with the DME company, as they can often help better with technical and mask fit issues. Please ask if patient has a preference regarding DME company.  Please also make sure, the patient has a follow-up appointment with me in about 8-10 weeks from the setup date, thanks.  Once you have spoken to the patient - and faxed/routed report to PCP and referring MD (if other than PCP), you can close this encounter, thanks,   Huston Foley, MD, PhD [facility] ([facility])

## 2016-11-08 NOTE — Addendum Note (Signed)
Addended by: Huston Foley on: 11/08/2016 01:02 PM   Modules accepted: Orders

## 2016-11-08 NOTE — Procedures (Signed)
PATIENT'S NAME:  Brian Reilly, Brian Reilly DOB:      1961/05/04      MR#:    [id number]     DATE OF RECORDING: 11/06/2016 REFERRING M.D.:  Janace Hoard, MD Study Performed:   CPAP  Titration HISTORY: 56 year old right-handed gentleman with an underlying medical history of type 2 diabetes, reflux disease, hypertension, chronic cough, sciatica, and morbid obesity, who returns for CPAP titration following his PSG performed on 10/09/2016. This showed an AHI of 15.3 and low SPO2 of 75%. The patient's weight 324 pounds with a height of 74 (inches), resulting in a BMI of 41.6 kg/m2. The patient's neck circumference measured 19 inches.  CURRENT MEDICATIONS: Aspirin, Tessalon, Flexeril, Microzide, Lisinopril, Metformin, Mobic, Protonix, Zocor    PROCEDURE:  This is a multichannel digital polysomnogram utilizing the SomnoStar 11.2 system.  Electrodes and sensors were applied and monitored per AASM Specifications.   EEG, EOG, Chin and Limb EMG, were sampled at 200 Hz.  ECG, Snore and Nasal Pressure, Thermal Airflow, Respiratory Effort, CPAP Flow and Pressure, Oximetry was sampled at 50 Hz. Digital video and audio were recorded.      The patient was fitted with a FFM, which he preferred: Airtouch, large. CPAP was initiated at 5 cmH20 with heated humidity per AASM standards and pressure was advanced to 9 cmH20 because of hypopneas, apneas and desaturations and snoring. At a PAP pressure of 9 cmH20, there was a reduction of the AHI to 0 with non-supine REM sleep achieved and O2 nadir of 90%.   Lights Out was at 22:26 and Lights On at 05:03. Total recording time (TRT) was 398 minutes, with a total sleep time (TST) of 268.5 minutes. The patient's sleep latency was 68 minutes with 0 minutes of wake time after sleep onset. REM latency was 59.5 minutes.  The sleep efficiency was 67.5 %.    SLEEP ARCHITECTURE: WASO (Wake after sleep onset)  was 89.5 minutes with moderate sleep fragmentation noted.  There were 30.5 minutes in Stage  N1, 150.5 minutes Stage N2, 50 minutes Stage N3 and 37.5 minutes in Stage REM.  The percentage of Stage N1 was 11.4%, which is increased, Stage N2 was 56.1%, which is borderline increased, Stage N3 was 18.6%, which is normal, and Stage R (REM sleep) was 14.%, which is reduced.   The arousals were noted as: 70 were spontaneous, 0 were associated with PLMs, 2 were associated with respiratory events.  Audio and video analysis did not show any abnormal or unusual movements, behaviors, phonations or vocalizations.  The patient took 1 bathroom break. The EKG was in keeping with normal sinus rhythm (NSR).   RESPIRATORY ANALYSIS:  There was a total of 5 respiratory events: 0 obstructive apneas, 0 central apneas and 0 mixed apneas with a total of 0 apneas and an apnea index (AI) of 0 /hour. There were 5 hypopneas with a hypopnea index of 1.1/hour. The patient also had 1 respiratory event related arousals (RERAs).      The total APNEA/HYPOPNEA INDEX  (AHI) was 1.1 /hour and the total RESPIRATORY DISTURBANCE INDEX was 1.3 .hour  3 events occurred in REM sleep and 2 events in NREM. The REM AHI was 4.8 /hour versus a non-REM AHI of .5 /hour.  The patient spent 31.5 minutes of total sleep time in the supine position and 237 minutes in non-supine. The supine AHI was 0.0, versus a non-supine AHI of 1.3.  OXYGEN SATURATION & C02:  The baseline 02 saturation was 95%, with the lowest being  88%. Time spent below 89% saturation equaled 1 minutes.  PERIODIC LIMB MOVEMENTS: The patient had a total of 0 Periodic Limb Movements. The Periodic Limb Movement (PLM) index was 0 and the PLM Arousal index was 0 /hour.  Post-study, the patient indicated that sleep was the same as usual.   DIAGNOSIS 1. Obstructive Sleep Apnea  2. Dysfunctions associated with Sleep stages or arousal from sleep    PLANS/RECOMMENDATIONS: 1. This study demonstrates resolution of the patient's obstructive sleep apnea with CPAP therapy. I will,  therefore, start the patient on home CPAP treatment at a pressure of 9 cm via large FFM with heated humidity. The patient should be reminded to be fully compliant with PAP therapy to improve sleep related symptoms and decrease long term cardiovascular risks. The patient should be reminded, that it may take up to 3 months to get fully used to using PAP with all planned sleep. The earlier full compliance is achieved, the better long term compliance tends to be. Please note that untreated obstructive sleep apnea carries additional perioperative morbidity. Patients with significant obstructive sleep apnea should receive perioperative PAP therapy and the surgeons and particularly the anesthesiologist should be informed of the diagnosis and the severity of the sleep disordered breathing. 2. This study shows sleep fragmentation and abnormal sleep stage percentages; these are nonspecific findings and per se do not signify an intrinsic sleep disorder or a cause for the patient's sleep-related symptoms. Causes include (but are not limited to) the first night effect of the sleep study, circadian rhythm disturbances, medication effect or an underlying mood disorder or medical problem.  3. The patient should be cautioned not to drive, work at heights, or operate dangerous or heavy equipment when tired or sleepy. Review and reiteration of good sleep hygiene measures should be pursued with any patient. 4. The patient will be seen in follow-up by Dr. Frances Furbish at [facility] for discussion of the test results and further management strategies. The referring provider will be notified of the test results.  I certify that I have reviewed the entire raw data recording prior to the issuance of this report in accordance with the Standards of Accreditation of the American Academy of Sleep Medicine (AASM)   Huston Foley, MD, PhD Diplomat, American Board of Psychiatry and Neurology (Neurology and Sleep Medicine)

## 2016-11-12 ENCOUNTER — Telehealth: Payer: Self-pay

## 2016-11-12 NOTE — Telephone Encounter (Signed)
I spoke to patient and he is aware of results and recommendations. He is willing to proceed with treatment. I will send orders to [facility]. I will send report to PCP. Patient was unable to make an appt at this time. I will send him a letter reminding him to make f/u appt and stress the importance of compliance.

## 2016-11-12 NOTE — Telephone Encounter (Signed)
-----   Message from Huston Foley, MD sent at 11/08/2016  1:02 PM EDT ----- Patient referred by Dr. Alwyn Ren, seen by me on 09/25/16, diagnostic PSG on 10/09/16, CPAP study on 11/06/16. Please call and inform patient that I have entered an order for treatment with positive airway pressure (PAP) treatment of obstructive sleep apnea (OSA). He did well during the latest sleep study with CPAP. We will, therefore, arrange for a machine for home use through a DME (durable medical equipment) company of His choice; and I will see the patient back in follow-up in about 8-10 weeks. Please also explain to the patient that I will be looking out for compliance data, which can be downloaded from the machine (stored on an SD card, that is inserted in the machine) or via remote access through a modem, that is built into the machine. At the time of the followup appointment we will discuss sleep study results and how it is going with PAP treatment at home. Please advise patient to bring His machine at the time of the first FU visit, even though this is cumbersome. Bringing the machine for every visit after that will likely not be needed, but often helps for the first visit to troubleshoot if needed. Please re-enforce the importance of compliance with treatment and the need for [place] to monitor compliance data - often an insurance requirement and actually good feedback for the patient as far as how they are doing.  Also remind patient, that any interim PAP machine or mask issues should be first addressed with the DME company, as they can often help better with technical and mask fit issues. Please ask if patient has a preference regarding DME company.  Please also make sure, the patient has a follow-up appointment with me in about 8-10 weeks from the setup date, thanks.  Once you have spoken to the patient - and faxed/routed report to PCP and referring MD (if other than PCP), you can close this encounter, thanks,   Huston Foley, MD,  PhD [facility] ([facility])

## 2016-11-26 DIAGNOSIS — G4733 Obstructive sleep apnea (adult) (pediatric): Secondary | ICD-10-CM | POA: Diagnosis not present

## 2016-12-26 DIAGNOSIS — G4733 Obstructive sleep apnea (adult) (pediatric): Secondary | ICD-10-CM | POA: Diagnosis not present

## 2017-01-26 DIAGNOSIS — G4733 Obstructive sleep apnea (adult) (pediatric): Secondary | ICD-10-CM | POA: Diagnosis not present

## 2017-02-06 ENCOUNTER — Telehealth: Payer: Self-pay | Admitting: Neurology

## 2017-02-06 NOTE — Telephone Encounter (Signed)
I called pt and offered him a sooner appt of 02/11/17 at 3:45pm with Eber Jonesarolyn, NP regarding his first cpap follow up. Pt is agreeable to this appt and was reminded to bring his cpap to this appt. I reminded pt several times that he should be here at 02/11/17 at 3:45 and a 3:30pm check in. (The phone call reception was bad and I wanted to make sure he understood.) Pt verbalized understanding of appt date and time.

## 2017-02-06 NOTE — Telephone Encounter (Signed)
Patient called office needing to be seen for initial CPAP fu with Dr. Frances FurbishAthar and stating he needs to be seen before July 17th, but we have nothing available.  Please call

## 2017-02-09 ENCOUNTER — Encounter: Payer: Self-pay | Admitting: Nurse Practitioner

## 2017-02-11 ENCOUNTER — Ambulatory Visit: Payer: Self-pay | Admitting: Nurse Practitioner

## 2017-02-13 ENCOUNTER — Encounter: Payer: Self-pay | Admitting: Nurse Practitioner

## 2017-02-19 ENCOUNTER — Encounter: Payer: Self-pay | Admitting: Nurse Practitioner

## 2017-02-19 ENCOUNTER — Ambulatory Visit (INDEPENDENT_AMBULATORY_CARE_PROVIDER_SITE_OTHER): Payer: Commercial Managed Care - PPO | Admitting: Nurse Practitioner

## 2017-02-19 VITALS — BP 118/73 | HR 67 | Ht 74.0 in | Wt 325.4 lb

## 2017-02-19 DIAGNOSIS — Z9989 Dependence on other enabling machines and devices: Secondary | ICD-10-CM | POA: Insufficient documentation

## 2017-02-19 DIAGNOSIS — G4733 Obstructive sleep apnea (adult) (pediatric): Secondary | ICD-10-CM
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
## 2017-02-19 NOTE — Patient Instructions (Signed)
CPAP compliance is 100% average usage 6 hours 37 minutes Continue CPAP nightly Follow-up in 6 months

## 2017-02-25 DIAGNOSIS — G4733 Obstructive sleep apnea (adult) (pediatric): Secondary | ICD-10-CM | POA: Diagnosis not present

## 2017-08-01 ENCOUNTER — Other Ambulatory Visit: Payer: Self-pay | Admitting: Family Medicine

## 2017-08-01 DIAGNOSIS — I10 Essential (primary) hypertension: Secondary | ICD-10-CM

## 2017-08-01 DIAGNOSIS — K219 Gastro-esophageal reflux disease without esophagitis: Secondary | ICD-10-CM

## 2017-08-18 ENCOUNTER — Encounter: Payer: Self-pay | Admitting: Neurology
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
## 2017-08-19 ENCOUNTER — Encounter: Payer: Self-pay | Admitting: Nurse Practitioner

## 2017-08-19 ENCOUNTER — Ambulatory Visit: Payer: Commercial Managed Care - PPO | Admitting: Nurse Practitioner

## 2017-08-19 VITALS — BP 151/90 | HR 87 | Wt 330.8 lb

## 2017-08-19 DIAGNOSIS — Z9989 Dependence on other enabling machines and devices: Secondary | ICD-10-CM | POA: Diagnosis not present

## 2017-08-19 DIAGNOSIS — G4733 Obstructive sleep apnea (adult) (pediatric): Secondary | ICD-10-CM

## 2017-08-19 NOTE — Patient Instructions (Signed)
CPAP compliance is 100%,  Continue same settings Continue CPAP nightly Follow-up with primary care regarding back pain Check with equipment company to make sure machine is working properly, there may be a leak Follow-up yearly for repeat compliance

## 2017-08-26 ENCOUNTER — Ambulatory Visit: Payer: Commercial Managed Care - PPO | Admitting: Family Medicine

## 2017-08-26 ENCOUNTER — Other Ambulatory Visit: Payer: Self-pay

## 2017-08-26 ENCOUNTER — Encounter: Payer: Self-pay | Admitting: Family Medicine

## 2017-08-26 VITALS — BP 152/92 | HR 91 | Temp 98.3°F | Resp 18 | Ht 74.0 in | Wt 331.8 lb

## 2017-08-26 DIAGNOSIS — M545 Low back pain, unspecified: Secondary | ICD-10-CM

## 2017-08-26 DIAGNOSIS — I10 Essential (primary) hypertension: Secondary | ICD-10-CM | POA: Diagnosis not present

## 2017-08-26 DIAGNOSIS — E785 Hyperlipidemia, unspecified: Secondary | ICD-10-CM | POA: Diagnosis not present

## 2017-08-26 DIAGNOSIS — K219 Gastro-esophageal reflux disease without esophagitis: Secondary | ICD-10-CM

## 2017-08-26 DIAGNOSIS — E1165 Type 2 diabetes mellitus with hyperglycemia: Secondary | ICD-10-CM | POA: Diagnosis not present

## 2017-08-26 DIAGNOSIS — G8929 Other chronic pain: Secondary | ICD-10-CM

## 2017-08-26 DIAGNOSIS — IMO0001 Reserved for inherently not codable concepts without codable children: Secondary | ICD-10-CM

## 2017-08-26 DIAGNOSIS — E119 Type 2 diabetes mellitus without complications: Secondary | ICD-10-CM | POA: Diagnosis not present

## 2017-08-26 MED ORDER — METFORMIN HCL 1000 MG PO TABS: 1000.0000 mg | ORAL_TABLET | Freq: Two times a day (BID) | ORAL | 3 refills | Status: DC

## 2017-08-26 MED ORDER — SIMVASTATIN 40 MG PO TABS: 40.0000 mg | ORAL_TABLET | Freq: Every evening | ORAL | 3 refills | Status: DC

## 2017-08-26 MED ORDER — MELOXICAM 15 MG PO TABS: 15.0000 mg | ORAL_TABLET | Freq: Every day | ORAL | 0 refills | Status: DC

## 2017-08-26 MED ORDER — PANTOPRAZOLE SODIUM 40 MG PO TBEC: 40.0000 mg | DELAYED_RELEASE_TABLET | Freq: Every day | ORAL | 3 refills | Status: DC

## 2017-08-26 MED ORDER — HYDROCHLOROTHIAZIDE 12.5 MG PO CAPS: 12.5000 mg | ORAL_CAPSULE | Freq: Every day | ORAL | 3 refills | Status: DC

## 2017-08-26 MED ORDER — LISINOPRIL 5 MG PO TABS: 5.0000 mg | ORAL_TABLET | Freq: Every day | ORAL | 3 refills | Status: DC

## 2017-08-26 NOTE — Patient Instructions (Addendum)
Continue taking hydrochlorothiazide and lisinopril for the pressure.  Continue taking the metformin twice daily for your diabetes  Take the meloxicam 1 daily if needed for flank pain.  For other pain in your back you can take over-the-counter acetaminophen (Tylenol) 500 mg 2 pills 2 or 3 times a day if necessary.  Continue taking simvastatin 1 each evening for cholesterol  Continue taking pantoprazole 1 daily for your stomach when needed.  With diabetes is important that you get your eyes checked at least once a year on a regular basis.  You should try to get daily exercise, taking a walk of at least 30 minutes every day or going to the gym.  You need to try and eat less.  This will be best accomplished by serving smaller portions and not getting second or third helpings.  You should not super size type fast food meals.  You should try to eat less rice and potatoes and more beans and vegetables and fruits.  It is important that you have one doctor who is regularly.  I recommend that you make an appointment with 1 of the physicians at the practice to be followed on a regular basis.   IF you received an x-ray today, you will receive an invoice from [facility]. Please contact [facility] at [phone] with questions or concerns regarding your invoice.   IF you received labwork today, you will receive an invoice from [facility]. Please contact [facility] at [phone] with questions or concerns regarding your invoice.   Our billing staff will not be able to assist you with questions regarding bills from these companies.  You will be contacted with the lab results as soon as they are available. The fastest way to get your results is to activate your My Chart account. Instructions are located on the last page of this paperwork. If you have not heard from us regarding the results in 2 weeks, please contact this office.
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
## 2017-08-27 LAB — HEMOGLOBIN A1C
Est. average glucose Bld gHb Est-mCnc: 157 mg/dL
HEMOGLOBIN A1C: 7.1 % — AB (ref 4.8–5.6)

## 2017-08-27 LAB — LIPID PANEL
Chol/HDL Ratio: 3.8 ratio (ref 0.0–5.0)
Cholesterol, Total: 120 mg/dL (ref 100–199)
HDL: 32 mg/dL — AB (ref >39)
LDL CALC: 57 mg/dL (ref 0–99)
TRIGLYCERIDES: 157 mg/dL — AB (ref 0–149)
VLDL Cholesterol Cal: 31 mg/dL (ref 5–40)

## 2017-08-27 LAB — CMP14+EGFR
ALBUMIN: 4.1 g/dL (ref 3.5–5.5)
ALK PHOS: 86 IU/L (ref 39–117)
ALT: 35 IU/L (ref 0–44)
AST: 16 IU/L (ref 0–40)
Albumin/Globulin Ratio: 1.3 (ref 1.2–2.2)
BILIRUBIN TOTAL: 0.3 mg/dL (ref 0.0–1.2)
BUN/Creatinine Ratio: 13 (ref 9–20)
BUN: 10 mg/dL (ref 6–24)
CHLORIDE: 103 mmol/L (ref 96–106)
CO2: 23 mmol/L (ref 20–29)
Calcium: 9.5 mg/dL (ref 8.7–10.2)
Creatinine, Ser: 0.8 mg/dL (ref 0.76–1.27)
GFR calc non Af Amer: 98 mL/min/{1.73_m2} (ref >59)
GFR, EST AFRICAN AMERICAN: 114 mL/min/{1.73_m2} (ref >59)
GLUCOSE: 115 mg/dL — AB (ref 65–99)
Globulin, Total: 3.2 g/dL (ref 1.5–4.5)
Potassium: 4.2 mmol/L (ref 3.5–5.2)
Sodium: 142 mmol/L (ref 134–144)
TOTAL PROTEIN: 7.3 g/dL (ref 6.0–8.5)

## 2017-09-24 ENCOUNTER — Other Ambulatory Visit: Payer: Self-pay | Admitting: Family Medicine

## 2017-09-24 DIAGNOSIS — M545 Low back pain, unspecified: Secondary | ICD-10-CM

## 2017-09-24 DIAGNOSIS — G8929 Other chronic pain: Secondary | ICD-10-CM

## 2017-11-28 ENCOUNTER — Ambulatory Visit: Payer: Commercial Managed Care - PPO | Admitting: Physician Assistant

## 2018-02-24 ENCOUNTER — Telehealth: Payer: Self-pay | Admitting: Family Medicine

## 2018-02-24 DIAGNOSIS — E119 Type 2 diabetes mellitus without complications: Secondary | ICD-10-CM

## 2018-02-25 NOTE — Telephone Encounter (Signed)
One touch lancets refill Last OV:08/26/17 Last refill:08/02/16 (expired) ZOX:[id number]:[id number] [address] - [place], [place] - [address] [phone] (Phone) [phone] (Fax)

## 2018-02-28 ENCOUNTER — Other Ambulatory Visit: Payer: Self-pay

## 2018-02-28 ENCOUNTER — Encounter: Payer: Self-pay | Admitting: Family Medicine

## 2018-02-28 ENCOUNTER — Ambulatory Visit (INDEPENDENT_AMBULATORY_CARE_PROVIDER_SITE_OTHER): Payer: Commercial Managed Care - PPO | Admitting: Family Medicine

## 2018-02-28 VITALS — BP 138/86 | HR 81 | Temp 98.6°F | Resp 20 | Ht 74.0 in | Wt 327.3 lb

## 2018-02-28 DIAGNOSIS — I10 Essential (primary) hypertension: Secondary | ICD-10-CM

## 2018-02-28 DIAGNOSIS — K219 Gastro-esophageal reflux disease without esophagitis: Secondary | ICD-10-CM

## 2018-02-28 DIAGNOSIS — E119 Type 2 diabetes mellitus without complications: Secondary | ICD-10-CM | POA: Diagnosis not present

## 2018-02-28 DIAGNOSIS — R300 Dysuria: Secondary | ICD-10-CM

## 2018-02-28 DIAGNOSIS — R310 Gross hematuria: Secondary | ICD-10-CM

## 2018-02-28 LAB — POC MICROSCOPIC URINALYSIS (UMFC): Mucus: ABSENT

## 2018-02-28 LAB — POCT URINALYSIS DIP (MANUAL ENTRY)
Bilirubin, UA: NEGATIVE
Blood, UA: NEGATIVE
Glucose, UA: NEGATIVE mg/dL
Ketones, POC UA: NEGATIVE mg/dL
Leukocytes, UA: NEGATIVE
Nitrite, UA: NEGATIVE
Spec Grav, UA: 1.02 (ref 1.010–1.025)
Urobilinogen, UA: 0.2 E.U./dL
pH, UA: 6.5 (ref 5.0–8.0)

## 2018-02-28 MED ORDER — SIMVASTATIN 40 MG PO TABS: 40.0000 mg | ORAL_TABLET | Freq: Every evening | ORAL | 3 refills | Status: DC

## 2018-02-28 MED ORDER — ONETOUCH DELICA LANCETS FINE MISC: 1.0000 | Freq: Every day | 5 refills | Status: DC

## 2018-02-28 MED ORDER — GLUCOSE BLOOD VI STRP: ORAL_STRIP | 12 refills | Status: DC

## 2018-02-28 MED ORDER — METFORMIN HCL 1000 MG PO TABS: 1000.0000 mg | ORAL_TABLET | Freq: Two times a day (BID) | ORAL | 3 refills | Status: DC

## 2018-02-28 MED ORDER — PANTOPRAZOLE SODIUM 40 MG PO TBEC: 40.0000 mg | DELAYED_RELEASE_TABLET | Freq: Every day | ORAL | 3 refills | Status: DC

## 2018-02-28 MED ORDER — LISINOPRIL 5 MG PO TABS: 5.0000 mg | ORAL_TABLET | Freq: Every day | ORAL | 3 refills | Status: DC

## 2018-02-28 MED ORDER — HYDROCHLOROTHIAZIDE 12.5 MG PO CAPS: 12.5000 mg | ORAL_CAPSULE | Freq: Every day | ORAL | 3 refills | Status: DC
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
## 2018-02-28 NOTE — Patient Instructions (Signed)
     IF you received an x-ray today, you will receive an invoice from [facility]. Please contact [facility] at [phone] with questions or concerns regarding your invoice.   IF you received labwork today, you will receive an invoice from [facility]. Please contact [facility] at [phone] with questions or concerns regarding your invoice.   Our billing staff will not be able to assist you with questions regarding bills from these companies.  You will be contacted with the lab results as soon as they are available. The fastest way to get your results is to activate your My Chart account. Instructions are located on the last page of this paperwork. If you have not heard from us regarding the results in 2 weeks, please contact this office.     

## 2018-03-01 LAB — COMPREHENSIVE METABOLIC PANEL
ALT: 52 IU/L — ABNORMAL HIGH (ref 0–44)
AST: 22 IU/L (ref 0–40)
Albumin/Globulin Ratio: 1.3 (ref 1.2–2.2)
Albumin: 4.2 g/dL (ref 3.5–5.5)
Alkaline Phosphatase: 88 IU/L (ref 39–117)
BUN/Creatinine Ratio: 7 — ABNORMAL LOW (ref 9–20)
BUN: 6 mg/dL (ref 6–24)
Bilirubin Total: 0.3 mg/dL (ref 0.0–1.2)
CO2: 24 mmol/L (ref 20–29)
Calcium: 9.6 mg/dL (ref 8.7–10.2)
Chloride: 102 mmol/L (ref 96–106)
Creatinine, Ser: 0.88 mg/dL (ref 0.76–1.27)
GFR calc Af Amer: 109 mL/min/{1.73_m2} (ref >59)
GFR calc non Af Amer: 95 mL/min/{1.73_m2} (ref >59)
Globulin, Total: 3.2 g/dL (ref 1.5–4.5)
Glucose: 117 mg/dL — ABNORMAL HIGH (ref 65–99)
Potassium: 4.7 mmol/L (ref 3.5–5.2)
Sodium: 141 mmol/L (ref 134–144)
Total Protein: 7.4 g/dL (ref 6.0–8.5)

## 2018-03-01 LAB — URINE CULTURE: Organism ID, Bacteria: NO GROWTH

## 2018-03-01 LAB — HEMOGLOBIN A1C
Est. average glucose Bld gHb Est-mCnc: 200 mg/dL
Hgb A1c MFr Bld: 8.6 % — ABNORMAL HIGH (ref 4.8–5.6)

## 2018-03-02 MED ORDER — SITAGLIPTIN PHOSPHATE 100 MG PO TABS: 100.0000 mg | ORAL_TABLET | Freq: Every day | ORAL | 5 refills | Status: DC

## 2018-03-02 NOTE — Addendum Note (Signed)
Addended by: Myles LippsSANTIAGO, IRMA M on: 03/02/2018 08:29 AM   Modules accepted: Orders

## 2018-03-05 NOTE — Telephone Encounter (Signed)
Refill request for onetouch delica lancets fine misc  #[phone] with 5 refills approved. Dgaddy, CMA

## 2018-03-24 ENCOUNTER — Other Ambulatory Visit: Payer: Self-pay | Admitting: Family Medicine

## 2018-03-24 NOTE — Telephone Encounter (Signed)
Rx for Januvia is too expensive for the patient. Requesting anothier medication for DM. Medication is 444.00 with insurance

## 2018-03-26 ENCOUNTER — Other Ambulatory Visit: Payer: Self-pay | Admitting: Family Medicine

## 2018-03-26 DIAGNOSIS — E119 Type 2 diabetes mellitus without complications: Secondary | ICD-10-CM

## 2018-03-26 MED ORDER — ONETOUCH DELICA LANCETS FINE MISC: 1.0000 | Freq: Every day | 5 refills | Status: DC

## 2018-03-26 MED ORDER — SAXAGLIPTIN HCL 5 MG PO TABS: 5.0000 mg | ORAL_TABLET | Freq: Every day | ORAL | 4 refills | Status: DC

## 2018-08-01 DIAGNOSIS — E119 Type 2 diabetes mellitus without complications: Secondary | ICD-10-CM | POA: Diagnosis not present
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
## 2018-08-20 ENCOUNTER — Telehealth: Payer: Self-pay | Admitting: *Deleted

## 2018-08-20 ENCOUNTER — Ambulatory Visit: Payer: Commercial Managed Care - PPO | Admitting: Nurse Practitioner

## 2018-08-20 NOTE — Telephone Encounter (Signed)
Patient was no show for follow up with NP today.  

## 2018-08-21 ENCOUNTER — Encounter: Payer: Self-pay | Admitting: Nurse Practitioner

## 2018-08-26 LAB — HM DIABETES EYE EXAM

## 2018-09-04 ENCOUNTER — Ambulatory Visit: Payer: Commercial Managed Care - PPO | Admitting: Family Medicine

## 2018-09-11 ENCOUNTER — Ambulatory Visit (INDEPENDENT_AMBULATORY_CARE_PROVIDER_SITE_OTHER): Payer: Commercial Managed Care - PPO | Admitting: Family Medicine

## 2018-09-11 ENCOUNTER — Other Ambulatory Visit: Payer: Self-pay

## 2018-09-11 ENCOUNTER — Encounter: Payer: Self-pay | Admitting: Family Medicine

## 2018-09-11 VITALS — BP 133/83 | HR 69 | Temp 98.6°F | Resp 18 | Ht 74.21 in | Wt 323.2 lb

## 2018-09-11 DIAGNOSIS — I10 Essential (primary) hypertension: Secondary | ICD-10-CM | POA: Diagnosis not present

## 2018-09-11 DIAGNOSIS — R32 Unspecified urinary incontinence: Secondary | ICD-10-CM

## 2018-09-11 DIAGNOSIS — E1165 Type 2 diabetes mellitus with hyperglycemia: Secondary | ICD-10-CM | POA: Diagnosis not present

## 2018-09-11 DIAGNOSIS — IMO0001 Reserved for inherently not codable concepts without codable children: Secondary | ICD-10-CM

## 2018-09-11 DIAGNOSIS — N3001 Acute cystitis with hematuria: Secondary | ICD-10-CM

## 2018-09-11 DIAGNOSIS — K219 Gastro-esophageal reflux disease without esophagitis: Secondary | ICD-10-CM

## 2018-09-11 DIAGNOSIS — E785 Hyperlipidemia, unspecified: Secondary | ICD-10-CM

## 2018-09-11 LAB — POCT URINALYSIS DIP (MANUAL ENTRY)
Bilirubin, UA: NEGATIVE
Glucose, UA: NEGATIVE mg/dL
Nitrite, UA: NEGATIVE
Protein Ur, POC: 100 mg/dL — AB
Spec Grav, UA: 1.025 (ref 1.010–1.025)
Urobilinogen, UA: 0.2 E.U./dL
pH, UA: 5.5 (ref 5.0–8.0)

## 2018-09-11 LAB — POC MICROSCOPIC URINALYSIS (UMFC): Mucus: ABSENT

## 2018-09-11 LAB — POCT GLYCOSYLATED HEMOGLOBIN (HGB A1C): Hemoglobin A1C: 9.6 % — AB (ref 4.0–5.6)

## 2018-09-11 MED ORDER — LISINOPRIL 5 MG PO TABS: 5.0000 mg | ORAL_TABLET | Freq: Every day | ORAL | 3 refills | Status: DC

## 2018-09-11 MED ORDER — PANTOPRAZOLE SODIUM 40 MG PO TBEC: 40.0000 mg | DELAYED_RELEASE_TABLET | Freq: Every day | ORAL | 3 refills | Status: DC

## 2018-09-11 MED ORDER — SIMVASTATIN 40 MG PO TABS: 40.0000 mg | ORAL_TABLET | Freq: Every evening | ORAL | 3 refills | Status: DC

## 2018-09-11 MED ORDER — METFORMIN HCL 1000 MG PO TABS: 1000.0000 mg | ORAL_TABLET | Freq: Two times a day (BID) | ORAL | 3 refills | Status: DC

## 2018-09-11 MED ORDER — BLOOD GLUCOSE METER KIT: PACK | 11 refills | Status: DC

## 2018-09-11 MED ORDER — HYDROCHLOROTHIAZIDE 12.5 MG PO CAPS: 12.5000 mg | ORAL_CAPSULE | Freq: Every day | ORAL | 3 refills | Status: DC

## 2018-09-11 MED ORDER — DOXYCYCLINE HYCLATE 100 MG PO TABS: 100.0000 mg | ORAL_TABLET | Freq: Two times a day (BID) | ORAL | 0 refills | Status: DC

## 2018-09-11 MED ORDER — EXENATIDE ER 2 MG/0.85ML ~~LOC~~ AUIJ: 2.0000 mg | AUTO-INJECTOR | SUBCUTANEOUS | 5 refills | Status: DC

## 2018-09-11 NOTE — Patient Instructions (Addendum)
   If you have lab work done today you will be contacted with your lab results within the next 2 weeks.  If you have not heard from us then please contact us. The fastest way to get your results is to register for My Chart.   IF you received an x-ray today, you will receive an invoice from [facility]. Please contact [facility] at [phone] with questions or concerns regarding your invoice.   IF you received labwork today, you will receive an invoice from [facility]. Please contact [facility] at [phone] with questions or concerns regarding your invoice.   Our billing staff will not be able to assist you with questions regarding bills from these companies.  You will be contacted with the lab results as soon as they are available. The fastest way to get your results is to activate your My Chart account. Instructions are located on the last page of this paperwork. If you have not heard from us regarding the results in 2 weeks, please contact this office.     Diabetes mellitus y nutricin, en adultos Diabetes Mellitus and Nutrition, Adult Si sufre de diabetes (diabetes mellitus), es muy importante tener hbitos alimenticios saludables debido a que sus niveles de azcar en la sangre (glucosa) se ven afectados en gran medida por lo que come y bebe. Comer alimentos saludables en las cantidades adecuadas, aproximadamente a la misma hora todos los das, lo ayudar a:  Controlar la glucemia.  Disminuir el riesgo de sufrir una enfermedad cardaca.  Mejorar la presin arterial.  Alcanzar o mantener un peso saludable. Todas las personas que sufren de diabetes son diferentes y cada una tiene necesidades diferentes en cuanto a un plan de alimentacin. El mdico puede recomendarle que trabaje con un especialista en dietas y nutricin (nutricionista) para elaborar el mejor plan para usted. Su plan de alimentacin puede variar segn factores como:  Las caloras que  necesita.  Los medicamentos que toma.  Su peso.  Sus niveles de glucemia, presin arterial y colesterol.  Su nivel de actividad.  Otras afecciones que tenga, como enfermedades cardacas o renales. Cmo me afectan los carbohidratos? Los carbohidratos, o hidratos de carbono, afectan su nivel de glucemia ms que cualquier otro tipo de alimento. La ingesta de carbohidratos naturalmente aumenta la cantidad de glucosa en la sangre. El recuento de carbohidratos es un mtodo destinado a llevar un registro de la cantidad de carbohidratos que se consumen. El recuento de carbohidratos es importante para mantener la glucemia a un nivel saludable, especialmente si utiliza insulina o toma determinados medicamentos por va oral para la diabetes. Es importante conocer la cantidad de carbohidratos que se pueden ingerir en cada comida sin correr ningn riesgo. Esto es diferente en cada persona. Su nutricionista puede ayudarlo a calcular la cantidad de carbohidratos que debe ingerir en cada comida y en cada refrigerio. Entre los alimentos que contienen carbohidratos, se incluyen:  Pan, cereal, arroz, pastas y galletas.  Papas y maz.  Guisantes, frijoles y lentejas.  Leche y yogur.  Frutas y jugo.  Postres, como pasteles, galletas, helado y caramelos. Cmo me afecta el alcohol? El alcohol puede provocar disminuciones sbitas de la glucemia (hipoglucemia), especialmente si utiliza insulina o toma determinados medicamentos por va oral para la diabetes. La hipoglucemia es una afeccin potencialmente mortal. Los sntomas de la hipoglucemia (somnolencia, mareos y confusin) son similares a los sntomas de haber consumido demasiado alcohol. Si el mdico afirma que el alcohol es seguro para usted, siga estas pautas:    Limite el consumo de alcohol a no ms de 1medida por da si es mujer y no est embarazada, y a 2medidas si es hombre. Una medida equivale a 12oz (355ml) de cerveza, 5oz (148ml) de vino o  1oz (44ml) de bebidas alcohlicas de alta graduacin.  No beba con el estmago vaco.  Mantngase hidratado bebiendo agua, refrescos dietticos o t helado sin azcar.  Tenga en cuenta que los refrescos comunes, los jugos y otras bebida para mezclar pueden contener mucha azcar y se deben contar como carbohidratos. Cules son algunos consejos para seguir este plan?  Leer las etiquetas de los alimentos  Comience por leer el tamao de la porcin en la "Informacin nutricional" en las etiquetas de los alimentos envasados y las bebidas. La cantidad de caloras, carbohidratos, grasas y otros nutrientes mencionados en la etiqueta se basan en una porcin del alimento. Muchos alimentos contienen ms de una porcin por envase.  Verifique la cantidad total de gramos (g) de carbohidratos totales en una porcin. Puede calcular la cantidad de porciones de carbohidratos al dividir el total de carbohidratos por 15. Por ejemplo, si un alimento tiene un total de 30g de carbohidratos, equivale a 2 porciones de carbohidratos.  Verifique la cantidad de gramos (g) de grasas saturadas y grasas trans en una porcin. Escoja alimentos que no contengan grasa o que tengan un bajo contenido.  Verifique la cantidad de miligramos (mg) de sal (sodio) en una porcin. La mayora de las personas deben limitar la ingesta de sodio total a menos de 2300mg por da.  Siempre consulte la informacin nutricional de los alimentos etiquetados como "con bajo contenido de grasa" o "sin grasa". Estos alimentos pueden tener un mayor contenido de azcar agregada o carbohidratos refinados, y deben evitarse.  Hable con su nutricionista para identificar sus objetivos diarios en cuanto a los nutrientes mencionados en la etiqueta. Al ir de compras  Evite comprar alimentos procesados, enlatados o precocinados. Estos alimentos tienden a tener una mayor cantidad de grasa, sodio y azcar agregada.  Compre en la zona exterior de la tienda de  comestibles. Esta zona incluye frutas y verduras frescas, granos a granel, carnes frescas y productos lcteos frescos. Al cocinar  Utilice mtodos de coccin a baja temperatura, como hornear, en lugar de mtodos de coccin a alta temperatura, como frer en abundante aceite.  Cocine con aceites saludables, como el aceite de oliva, canola o girasol.  Evite cocinar con manteca, crema o carnes con alto contenido de grasa. Planificacin de las comidas  Coma las comidas y los refrigerios regularmente, preferentemente a la misma hora todos los das. Evite pasar largos perodos de tiempo sin comer.  Consuma alimentos ricos en fibra, como frutas frescas, verduras, frijoles y cereales integrales. Consulte a su nutricionista sobre cuntas porciones de carbohidratos puede consumir en cada comida.  Consuma entre 4 y 6 onzas (oz) de protenas magras por da, como carnes magras, pollo, pescado, huevos o tofu. Una onza de protena magra equivale a: ? 1 onza de carne, pollo o pescado. ? 1huevo. ?  taza de tofu.  Coma algunos alimentos por da que contengan grasas saludables, como aguacates, frutos secos, semillas y pescado. Estilo de vida  Controle su nivel de glucemia con regularidad.  Haga actividad fsica habitualmente como se lo haya indicado el mdico. Esto puede incluir lo siguiente: ? 150minutos semanales de ejercicio de intensidad moderada o alta. Esto podra incluir caminatas dinmicas, ciclismo o gimnasia acutica. ? Realizar ejercicios de elongacin y de fortalecimiento, como yoga o levantamiento   de pesas, por lo menos 2veces por semana.  Tome los medicamentos como se lo haya indicado el mdico.  No consuma ningn producto que contenga nicotina o tabaco, como cigarrillos y cigarrillos electrnicos. Si necesita ayuda para dejar de fumar, consulte al mdico.  Trabaje con un asesor o instructor en diabetes para identificar estrategias para controlar el estrs y cualquier desafo emocional  y social. Preguntas para hacerle al mdico  Es necesario que consulte a un instructor en el cuidado de la diabetes?  Es necesario que me rena con un nutricionista?  A qu nmero puedo llamar si tengo preguntas?  Cules son los mejores momentos para controlar la glucemia? Dnde encontrar ms informacin:  Asociacin Estadounidense de la Diabetes (American Diabetes Association): diabetes.org  Academia de Nutricin y Diettica (Academy of Nutrition and Dietetics): www.eatright.org  Instituto Nacional de la Diabetes y las Enfermedades Digestivas y Renales (National Institute of Diabetes and Digestive and Kidney Diseases, NIH): www.niddk.nih.gov Resumen  Un plan de alimentacin saludable lo ayudar a controlar la glucemia y mantener un estilo de vida saludable.  Trabajar con un especialista en dietas y nutricin (nutricionista) puede ayudarlo a elaborar el mejor plan de alimentacin para usted.  Tenga en cuenta que los carbohidratos (hidratos de carbono) y el alcohol tienen efectos inmediatos en sus niveles de glucemia. Es importante contar los carbohidratos que ingiere y consumir alcohol con prudencia. Esta informacin no tiene como fin reemplazar el consejo del mdico. Asegrese de hacerle al mdico cualquier pregunta que tenga. Document Released: 11/05/2007 Document Revised: 04/08/2017 Document Reviewed: 11/18/2016 Elsevier Interactive Patient Education  2019 Elsevier Inc.  
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
## 2018-09-12 LAB — CMP14+EGFR
ALT: 24 IU/L (ref 0–44)
AST: 14 IU/L (ref 0–40)
Albumin/Globulin Ratio: 1.3 (ref 1.2–2.2)
Albumin: 3.9 g/dL (ref 3.8–4.9)
Alkaline Phosphatase: 84 IU/L (ref 39–117)
BUN/Creatinine Ratio: 8 — ABNORMAL LOW (ref 9–20)
BUN: 7 mg/dL (ref 6–24)
Bilirubin Total: 0.2 mg/dL (ref 0.0–1.2)
CO2: 20 mmol/L (ref 20–29)
Calcium: 9.3 mg/dL (ref 8.7–10.2)
Chloride: 103 mmol/L (ref 96–106)
Creatinine, Ser: 0.83 mg/dL (ref 0.76–1.27)
GFR calc Af Amer: 111 mL/min/{1.73_m2} (ref >59)
GFR calc non Af Amer: 96 mL/min/{1.73_m2} (ref >59)
Globulin, Total: 3 g/dL (ref 1.5–4.5)
Glucose: 136 mg/dL — ABNORMAL HIGH (ref 65–99)
Potassium: 4.4 mmol/L (ref 3.5–5.2)
Sodium: 144 mmol/L (ref 134–144)
Total Protein: 6.9 g/dL (ref 6.0–8.5)

## 2018-09-12 LAB — MICROALBUMIN / CREATININE URINE RATIO
Creatinine, Urine: 140.8 mg/dL
Microalb/Creat Ratio: 105 mg/g creat — ABNORMAL HIGH (ref 0–29)
Microalbumin, Urine: 148.5 ug/mL

## 2018-09-12 LAB — TSH: TSH: 1.53 u[IU]/mL (ref 0.450–4.500)

## 2018-09-12 LAB — LIPID PANEL
Chol/HDL Ratio: 3.3 ratio (ref 0.0–5.0)
Cholesterol, Total: 119 mg/dL (ref 100–199)
HDL: 36 mg/dL — ABNORMAL LOW (ref >39)
LDL Calculated: 62 mg/dL (ref 0–99)
Triglycerides: 105 mg/dL (ref 0–149)
VLDL Cholesterol Cal: 21 mg/dL (ref 5–40)

## 2018-09-13 LAB — URINE CULTURE

## 2018-09-18 MED ORDER — CIPROFLOXACIN HCL 500 MG PO TABS: 500.0000 mg | ORAL_TABLET | Freq: Two times a day (BID) | ORAL | 0 refills | Status: DC

## 2018-09-18 NOTE — Addendum Note (Signed)
Addended by: Myles Lipps on: 09/18/2018 11:21 PM   Modules accepted: Orders

## 2018-09-19 ENCOUNTER — Telehealth: Payer: Self-pay

## 2018-09-19 NOTE — Telephone Encounter (Signed)
Spoke with pt this  Morning about medication change. Advised pt per provider to stop taking Doxycycline and start taking Ciprofloxacin. He verbalized understanding.

## 2018-09-21 ENCOUNTER — Telehealth: Payer: Self-pay | Admitting: Family Medicine

## 2018-09-21 NOTE — Telephone Encounter (Signed)
Spoke to pt he states he has no other concerns at this time.

## 2018-09-21 NOTE — Telephone Encounter (Signed)
Copied from CRM [id number]. Topic: Quick Communication - Lab Results (Clinic Use ONLY) >> Sep 21, 2018 10:08 AM Louie Bun, Rosey Bath D wrote: Patient calling wanting his lab results from Dr. Leretha Pol or her CMA. Please call patient back, thanks.

## 2018-09-30 ENCOUNTER — Telehealth: Payer: Self-pay | Admitting: Family Medicine

## 2018-09-30 NOTE — Telephone Encounter (Signed)
Copied from CRM [id number]. Topic: General - Other >> Sep 30, 2018  3:24 PM Gaynelle Adu wrote: Reason for CRM:  Patient is calling to state he is needing something different called in for his rash, the patient also stated  he was suppose to receive a call back in regards to labs. Please advise

## 2018-10-06 NOTE — Telephone Encounter (Signed)
Patient was called regarding labs on 09/21/2018 Labs were normal except I needed to change his antibiotic I left VM, I would like to discuss rash a bit more I advised that me might need to make an appointment to see me.

## 2018-10-06 NOTE — Telephone Encounter (Signed)
Please advise 

## 2018-10-07 NOTE — Telephone Encounter (Signed)
Spoke with patient.

## 2018-10-19 DIAGNOSIS — N401 Enlarged prostate with lower urinary tract symptoms: Secondary | ICD-10-CM | POA: Diagnosis not present

## 2018-10-19 DIAGNOSIS — R3915 Urgency of urination: Secondary | ICD-10-CM | POA: Diagnosis not present

## 2018-10-30 ENCOUNTER — Ambulatory Visit: Payer: Self-pay | Admitting: Family Medicine

## 2018-10-30 NOTE — Telephone Encounter (Signed)
My son has a cough for 4 days.   Last night fever 100.   Gave Nyquil then fever down to 98.    No shortness of breath.   Drinking a little.     THIS  CHART WAS OPENED BY MISTAKE.   THE FATHER Joan Delarocha WAS CALLING IN FOR HIS SON BUT HE GAVE ME HIS NAME AND DOB.   IT WASN'T UNTIL I STARTED TRIAGING THAT I FOUND OUT HE WAS CALLING FOR HIS SON NOT HIMSELF.  I THEN OPENED THE SON'S CHART FOR THE TRIAGE.   SO DISREGARD THIS CHARTING.   IT WAS DONE IN ERROR.  Answer Assessment - Initial Assessment Questions 1. ONSET: "When did the nasal discharge start?"      Yes a runny nose 2. AMOUNT: "How much discharge is there?"      *No Answer* 3. COUGH: "Do you have a cough?" If yes, ask: "Describe the color of your sputum" (clear, white, yellow, green)     *No Answer* 4. RESPIRATORY DISTRESS: "Describe your breathing."      *No Answer* 5. FEVER: "Do you have a fever?" If so, ask: "What is your temperature, how was it measured, and when did it start?"     *No Answer* 6. SEVERITY: "Overall, how bad are you feeling right now?" (e.g., doesn't interfere with normal activities, staying home from school/work, staying in bed)      *No Answer* 7. OTHER SYMPTOMS: "Do you have any other symptoms?" (e.g., sore throat, earache, wheezing, vomiting)     Diarrhea no   And no vomiting.    Sore throat. 8. PREGNANCY: "Is there any chance you are pregnant?" "When was your last menstrual period?"     *No Answer*  Protocols used: COMMON COLD-A-AH

## 2018-11-06 ENCOUNTER — Telehealth: Payer: Self-pay | Admitting: Family Medicine

## 2018-11-06 NOTE — Telephone Encounter (Signed)
Copied from CRM [id number]. Topic: Quick Communication - See Telephone Encounter >> Nov 06, 2018  2:16 PM Jens Som A wrote: CRM for notification. See Telephone encounter for: 11/06/18.  Patient is calling because Dr. Leretha Pol gave the patient a coupon to get a medication that the patent does not know the name to get it for free. The patient went to the pharmacy and the pharmacy says that the medication is $410. The patient can not afford. Please advise. [phone] (M)

## 2018-11-11 NOTE — Telephone Encounter (Signed)
Please advise not sure what medication he is talking about not sure if you can remember conversation

## 2018-11-12 NOTE — Telephone Encounter (Signed)
Please call pharmacy and investigate. I prescribed bydureon b cise. Gave a coupon. Why is it $400?  When they run this medication, does any alternative show as covered?  Thanks.

## 2018-11-13 ENCOUNTER — Other Ambulatory Visit: Payer: Self-pay

## 2018-11-13 MED ORDER — EXENATIDE ER 2 MG/0.85ML ~~LOC~~ AUIJ: 2.0000 mg | AUTO-INJECTOR | SUBCUTANEOUS | 2 refills | Status: DC

## 2018-11-13 NOTE — Telephone Encounter (Signed)
Spoke with Doyne Keel and it has to be a mail order 90 day supply and costs 87.50. I spoke with patient and he understood.

## 2018-12-09 ENCOUNTER — Other Ambulatory Visit: Payer: Self-pay

## 2018-12-09 ENCOUNTER — Telehealth: Payer: Self-pay | Admitting: Family Medicine

## 2018-12-09 ENCOUNTER — Telehealth (INDEPENDENT_AMBULATORY_CARE_PROVIDER_SITE_OTHER): Payer: Commercial Managed Care - PPO | Admitting: Family Medicine

## 2018-12-09 ENCOUNTER — Encounter: Payer: Self-pay | Admitting: Family Medicine

## 2018-12-09 DIAGNOSIS — E1165 Type 2 diabetes mellitus with hyperglycemia: Secondary | ICD-10-CM | POA: Diagnosis not present

## 2018-12-09 DIAGNOSIS — E1169 Type 2 diabetes mellitus with other specified complication: Secondary | ICD-10-CM | POA: Insufficient documentation

## 2018-12-09 DIAGNOSIS — E785 Hyperlipidemia, unspecified: Secondary | ICD-10-CM

## 2018-12-09 DIAGNOSIS — IMO0002 Reserved for concepts with insufficient information to code with codable children: Secondary | ICD-10-CM

## 2018-12-09 DIAGNOSIS — I10 Essential (primary) hypertension: Secondary | ICD-10-CM

## 2018-12-09 DIAGNOSIS — R809 Proteinuria, unspecified: Secondary | ICD-10-CM | POA: Insufficient documentation

## 2018-12-09 DIAGNOSIS — E1129 Type 2 diabetes mellitus with other diabetic kidney complication: Secondary | ICD-10-CM

## 2018-12-09 DIAGNOSIS — K219 Gastro-esophageal reflux disease without esophagitis: Secondary | ICD-10-CM

## 2018-12-09 MED ORDER — PANTOPRAZOLE SODIUM 40 MG PO TBEC: 40.0000 mg | DELAYED_RELEASE_TABLET | Freq: Every day | ORAL | 3 refills | Status: DC

## 2018-12-09 MED ORDER — EXENATIDE ER 2 MG/0.85ML ~~LOC~~ AUIJ: 2.0000 mg | AUTO-INJECTOR | SUBCUTANEOUS | 2 refills | Status: DC

## 2018-12-09 MED ORDER — LISINOPRIL 20 MG PO TABS: 20.0000 mg | ORAL_TABLET | Freq: Every day | ORAL | 1 refills | Status: DC

## 2018-12-09 NOTE — Progress Notes (Signed)
Pt c/o follow up on Diabetes and hypertension.
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
## 2018-12-09 NOTE — Telephone Encounter (Signed)
12/09/2018 - PATIENT HAD A TELEMED VISIT WITH DR. Darcel Bayley ON Wednesday (12/09/18). DR. Darcel Bayley HAS REQUESTED HE COME BACK IN A WEEK TO GET HIS BLOOD DRAWN ON THE NURSES SCHEDULE. SHE ALSO WANTS HIM TO FOLLOW-UP WITH HER IN 3 MONTHS (July). I TRIED TO SCHEDULE BUT HAD TO LEAVE A VOICE MAIL. MBC

## 2019-01-07 ENCOUNTER — Telehealth: Payer: Self-pay | Admitting: Family Medicine

## 2019-01-07 NOTE — Telephone Encounter (Signed)
Copied from CRM [id number]. Topic: General - Other >> Jan 07, 2019 11:20 AM Leafy Ro wrote: Reason for CRM:pt is taking exenatide er and his body is feeling different pt is having a hard time elaborating. Pt would like dr Leretha Pol to return his call. Pt BS yesterday was 110. Pt has not check BS today. Pt had visit with dr Leretha Pol on 12/09/2018

## 2019-01-12 NOTE — Telephone Encounter (Signed)
Spoke with pt ans he would like to know if he needs to follow-up about medication reaction? Please advise

## 2019-01-13 NOTE — Telephone Encounter (Signed)
Please schedule in clinic. thanks

## 2019-01-14 NOTE — Telephone Encounter (Signed)
Spoke to patient Scheduled w/ Dr. Alvy Bimler , Dr. Leretha Pol did not have availability

## 2019-01-19 ENCOUNTER — Encounter: Payer: Self-pay | Admitting: Emergency Medicine

## 2019-01-19 ENCOUNTER — Other Ambulatory Visit: Payer: Self-pay

## 2019-01-19 ENCOUNTER — Ambulatory Visit (INDEPENDENT_AMBULATORY_CARE_PROVIDER_SITE_OTHER): Payer: Commercial Managed Care - PPO | Admitting: Emergency Medicine

## 2019-01-19 VITALS — BP 138/88 | HR 104 | Temp 98.2°F | Resp 16 | Ht 73.0 in | Wt 330.0 lb

## 2019-01-19 DIAGNOSIS — E785 Hyperlipidemia, unspecified: Secondary | ICD-10-CM | POA: Diagnosis not present

## 2019-01-19 DIAGNOSIS — M5432 Sciatica, left side: Secondary | ICD-10-CM

## 2019-01-19 DIAGNOSIS — I10 Essential (primary) hypertension: Secondary | ICD-10-CM

## 2019-01-19 DIAGNOSIS — E1129 Type 2 diabetes mellitus with other diabetic kidney complication: Secondary | ICD-10-CM

## 2019-01-19 DIAGNOSIS — E1165 Type 2 diabetes mellitus with hyperglycemia: Secondary | ICD-10-CM

## 2019-01-19 DIAGNOSIS — IMO0002 Reserved for concepts with insufficient information to code with codable children: Secondary | ICD-10-CM

## 2019-01-19 MED ORDER — TRAMADOL HCL 50 MG PO TABS: 50.0000 mg | ORAL_TABLET | Freq: Three times a day (TID) | ORAL | 0 refills | Status: AC | PRN

## 2019-01-19 MED ORDER — CYCLOBENZAPRINE HCL 10 MG PO TABS: 10.0000 mg | ORAL_TABLET | Freq: Three times a day (TID) | ORAL | 0 refills | Status: DC | PRN
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
## 2019-01-19 NOTE — Patient Instructions (Addendum)
   If you have lab work done today you will be contacted with your lab results within the next 2 weeks.  If you have not heard from us then please contact us. The fastest way to get your results is to register for My Chart.   IF you received an x-ray today, you will receive an invoice from [facility]. Please contact [facility] at [phone] with questions or concerns regarding your invoice.   IF you received labwork today, you will receive an invoice from [facility]. Please contact [facility] at [phone] with questions or concerns regarding your invoice.   Our billing staff will not be able to assist you with questions regarding bills from these companies.  You will be contacted with the lab results as soon as they are available. The fastest way to get your results is to activate your My Chart account. Instructions are located on the last page of this paperwork. If you have not heard from us regarding the results in 2 weeks, please contact this office.     Citica (Sciatica) La citica es el dolor, la debilidad, el entumecimiento o el hormigueo a lo largo del nervio citico. El nervio citico comienza en la parte inferior de la espalda y desciende por la parte posterior de cada pierna. La citica se produce cuando este nervio se comprime o se ejerce presin sobre l. Suele desaparecer por s sola o con tratamiento. A veces, la citica puede volver a aparecer. CUIDADOS EN EL HOGAR Medicamentos  Tome los medicamentos de venta libre y los recetados solamente como se lo haya indicado el mdico.  No conduzca ni use maquinaria pesada mientras toma analgsicos recetados. Control del dolor  Si se lo indican, aplique hielo sobre la zona afectada. ? Ponga el hielo en una bolsa plstica. ? Coloque una toalla entre la piel y la bolsa de hielo. ? Coloque el hielo durante 20minutos, 2 a 3veces por da.  Despus del hielo, aplique calor sobre la zona afectada antes de hacer  ejercicio o con la frecuencia que le haya indicado el mdico. Use la fuente de calor que el mdico le indique, por ejemplo, una compresa de calor hmedo o una almohadilla trmica. ? Coloque una toalla entre la piel y la fuente de calor. ? Aplique el calor durante 20 a 30minutos. ? Retire la fuente de calor si la piel se le pone de color rojo brillante. Esto es muy importante si no puede sentir el dolor, el calor o el fro. Puede correr un riesgo mayor de sufrir quemaduras. Actividad  Retome sus actividades habituales como se lo haya indicado el mdico. Pregntele al mdico qu actividades son seguras para usted. ? Evite las actividades que empeoran la citica.  Descanse por breves perodos durante el da. Hgalo recostado o de pie. Esto suele ser mejor que descansar sentado. ? Cuando descanse durante perodos ms largos, haga alguna actividad fsica o un estiramiento entre los perodos de descanso. ? Evite estar sentado durante largos perodos sin moverse. Levntese y muvase al menos una vez cada hora.  Haga ejercicios y estrese con regularidad, como se lo indic el mdico.  No levante nada que pese ms de 10libras (4,5kg) mientras tenga sntomas de citica. ? Aunque no tenga sntomas, evite levantar objetos pesados. ? Evite levantar objetos pesados de forma repetida.  Al levantar objetos, hgalos siempre de una forma que sea segura para su cuerpo. Para esto, debe hacer lo siguiente: ? Flexione las rodillas. ? Mantenga el objeto cerca del   cuerpo. ? No se tuerza. Instrucciones generales  Mantenga una buena postura. ? Evite reclinarse hacia adelante cuando est sentado. ? Evite encorvar la espalda mientras est de pie.  Mantenga un peso saludable.  Use calzado cmodo, que le d soporte al pie. Evite usar tacones.  Evite dormir sobre un colchn que sea demasiado blando o demasiado duro. Es posible que sienta menos dolor si duerme en un colchn con apoyo suficientemente firme para  la espalda.  Concurra a todas las visitas de control como se lo haya indicado el mdico. Esto es importante. SOLICITE AYUDA SI:  Tiene un dolor con estas caractersticas: ? Lo despierta cuando est dormido. ? Empeora al estar recostado. ? Es peor que el dolor que tena en el pasado. ? Dura ms de 4semanas.  Pierde peso sin proponrselo. SOLICITE AYUDA DE INMEDIATO SI:  No puede controlar la orina (miccin) ni la evacuacin de la materia fecal (defecacin).  Tiene debilidad en alguna de estas zonas, y la debilidad empeora. ? La parte inferior de la espalda. ? La parte inferior del abdomen (pelvis). ? Los glteos. ? Las piernas.  Siente irritacin o inflamacin en la espalda.  Tiene sensacin de ardor al orinar. Esta informacin no tiene como fin reemplazar el consejo del mdico. Asegrese de hacerle al mdico cualquier pregunta que tenga. Document Released: 08/31/2010 Document Revised: 11/20/2015 Document Reviewed: 04/07/2015 Elsevier Interactive Patient Education  2019 Elsevier Inc.  

## 2019-01-20 LAB — COMPREHENSIVE METABOLIC PANEL
ALT: 34 IU/L (ref 0–44)
AST: 19 IU/L (ref 0–40)
Albumin/Globulin Ratio: 1.5 (ref 1.2–2.2)
Albumin: 4.3 g/dL (ref 3.8–4.9)
Alkaline Phosphatase: 71 IU/L (ref 39–117)
BUN/Creatinine Ratio: 9 (ref 9–20)
BUN: 8 mg/dL (ref 6–24)
Bilirubin Total: 0.3 mg/dL (ref 0.0–1.2)
CO2: 21 mmol/L (ref 20–29)
Calcium: 9.5 mg/dL (ref 8.7–10.2)
Chloride: 100 mmol/L (ref 96–106)
Creatinine, Ser: 0.85 mg/dL (ref 0.76–1.27)
GFR calc Af Amer: 110 mL/min/{1.73_m2} (ref >59)
GFR calc non Af Amer: 95 mL/min/{1.73_m2} (ref >59)
Globulin, Total: 2.9 g/dL (ref 1.5–4.5)
Glucose: 122 mg/dL — ABNORMAL HIGH (ref 65–99)
Potassium: 4.5 mmol/L (ref 3.5–5.2)
Sodium: 140 mmol/L (ref 134–144)
Total Protein: 7.2 g/dL (ref 6.0–8.5)

## 2019-01-20 LAB — CBC WITH DIFFERENTIAL/PLATELET
Basophils Absolute: 0.1 10*3/uL (ref 0.0–0.2)
Basos: 1 %
EOS (ABSOLUTE): 0.3 10*3/uL (ref 0.0–0.4)
Eos: 2 %
Hematocrit: 50.3 % (ref 37.5–51.0)
Hemoglobin: 16 g/dL (ref 13.0–17.7)
Immature Grans (Abs): 0 10*3/uL (ref 0.0–0.1)
Immature Granulocytes: 0 %
Lymphocytes Absolute: 3.4 10*3/uL — ABNORMAL HIGH (ref 0.7–3.1)
Lymphs: 30 %
MCH: 27.2 pg (ref 26.6–33.0)
MCHC: 31.8 g/dL (ref 31.5–35.7)
MCV: 85 fL (ref 79–97)
Monocytes Absolute: 0.9 10*3/uL (ref 0.1–0.9)
Monocytes: 8 %
Neutrophils Absolute: 6.7 10*3/uL (ref 1.4–7.0)
Neutrophils: 59 %
Platelets: 298 10*3/uL (ref 150–450)
RBC: 5.89 x10E6/uL — ABNORMAL HIGH (ref 4.14–5.80)
RDW: 13.1 % (ref 11.6–15.4)
WBC: 11.4 10*3/uL — ABNORMAL HIGH (ref 3.4–10.8)

## 2019-01-20 LAB — LIPID PANEL
Chol/HDL Ratio: 3.9 ratio (ref 0.0–5.0)
Cholesterol, Total: 125 mg/dL (ref 100–199)
HDL: 32 mg/dL — ABNORMAL LOW (ref >39)
LDL Calculated: 63 mg/dL (ref 0–99)
Triglycerides: 150 mg/dL — ABNORMAL HIGH (ref 0–149)
VLDL Cholesterol Cal: 30 mg/dL (ref 5–40)

## 2019-01-20 LAB — HEMOGLOBIN A1C
Est. average glucose Bld gHb Est-mCnc: 146 mg/dL
Hgb A1c MFr Bld: 6.7 % — ABNORMAL HIGH (ref 4.8–5.6)

## 2019-02-05 ENCOUNTER — Other Ambulatory Visit: Payer: Self-pay

## 2019-02-05 ENCOUNTER — Ambulatory Visit (INDEPENDENT_AMBULATORY_CARE_PROVIDER_SITE_OTHER): Payer: Commercial Managed Care - PPO | Admitting: Family Medicine

## 2019-02-05 ENCOUNTER — Encounter: Payer: Self-pay | Admitting: Family Medicine

## 2019-02-05 VITALS — BP 138/88 | HR 95 | Temp 98.5°F | Resp 16 | Ht 73.0 in | Wt 315.0 lb

## 2019-02-05 DIAGNOSIS — M5432 Sciatica, left side: Secondary | ICD-10-CM | POA: Diagnosis not present

## 2019-02-05 DIAGNOSIS — I10 Essential (primary) hypertension: Secondary | ICD-10-CM

## 2019-02-05 MED ORDER — CYCLOBENZAPRINE HCL 10 MG PO TABS: 10.0000 mg | ORAL_TABLET | Freq: Three times a day (TID) | ORAL | 0 refills | Status: DC | PRN

## 2019-02-05 NOTE — Patient Instructions (Addendum)
If you have lab work done today you will be contacted with your lab results within the next 2 weeks.  If you have not heard from us then please contact us. The fastest way to get your results is to register for My Chart.   IF you received an x-ray today, you will receive an invoice from [facility]. Please contact [facility] at [phone] with questions or concerns regarding your invoice.   IF you received labwork today, you will receive an invoice from [facility]. Please contact [facility] at [phone] with questions or concerns regarding your invoice.   Our billing staff will not be able to assist you with questions regarding bills from these companies.  You will be contacted with the lab results as soon as they are available. The fastest way to get your results is to activate your My Chart account. Instructions are located on the last page of this paperwork. If you have not heard from us regarding the results in 2 weeks, please contact this office.      Rehabilitacin para la citica Sciatica Rehab Consulte al mdico qu ejercicios son seguros para usted. Haga los ejercicios exactamente como se lo haya indicado el mdico y gradelos como se lo hayan indicado. Es normal sentir un leve estiramiento, tirn, rigidez o molestia cuando haga estos ejercicios, pero debe detenerse de inmediato si siente un dolor repentino o si el dolor empeora. No comience a hacer estos ejercicios hasta que se lo indique el mdico. Ejercicios de elongacin y amplitud de movimiento Estos ejercicios calientan los msculos y las articulaciones, y mejoran el movimiento y la flexibilidad de la cadera y la espalda. Estos ejercicios tambin ayudan a Engineer, materialsaliviar el dolor, el adormecimiento y el hormigueo. EjercicioA: Deslizamiento del nervio citico 1. Sintese en una silla con la cabeza hacia abajo en direccin al pecho. Coloque las manos detrs de la espalda. Deje caer los hombros hacia  adelante. 2. Extienda lentamente una de las rodillas mientras inclina la cabeza hacia atrs como si estuviera mirando hacia el techo. Solo extienda la pierna tan lejos como pueda sin empeorar los sntomas. 3. Mantenga esta posicin durante __________ segundos. 4. Vuelva lentamente a la posicin inicial. 5. Repita el ejercicio con la otra pierna. Repita __________ veces. Realice este ejercicio __________ veces al da. EjercicioB: Rodilla al pecho con aduccin de cadera y rotacin interna  1. Acustese boca arriba en una superficie firme con las piernas extendidas. 2. Flexione una rodilla y llvela hacia el pecho hasta que sienta un estiramiento suave en la parte inferior de la espalda y las nalgas. A continuacin, mueva la rodilla hacia el hombro que est en el lado contrario de la pierna. ? Mantenga la pierna en esta posicin tomando la parte frontal de la rodilla. 3. Mantenga esta posicin durante __________ segundos. 4. Vuelva lentamente a la posicin inicial. 5. Repita el ejercicio con la otra pierna. Repita __________ veces. Realice este ejercicio __________ veces al da. EjercicioC: Apple ComputerExtensin sobre los codos, en decbito prono  1. Acustese boca abajo sobre una superficie firme. La cama puede ser demasiado blanda para este ejercicio. 2. Apyese sobre los codos. 3. Con los brazos, aydese a Haematologistlevantar el pecho hasta sentir un leve estiramiento en el abdomen y la parte inferior de la espalda. ? Arts administratorsto colocar algo de Autolivpeso corporal sobre los codos. Si no se siente cmodo, intente colocando almohadas debajo del pecho. ? Debe dejar la cadera inmvil sobre la superficie en la que est apoyado. Mantenga la  cadera y los msculos de la espalda relajados. 4. Mantenga esta posicin durante __________ segundos. 5. Afloje lentamente la parte superior del cuerpo y vuelva a la posicin inicial. Repita __________ veces. Realice este ejercicio __________ veces al da. Ejercicios de fortalecimiento Estos  ejercicios fortalecen la espalda y le otorgan resistencia. La resistencia es la capacidad de usar los msculos durante un tiempo prolongado, incluso despus de que se cansen. EjercicioD: Inclinacin de la pelvis 1. Acustese boca arriba sobre una superficie firme. [place]. 2. Tensione los msculos abdominales. Eleve la pelvis hacia el techo y aplane la parte inferior de la espalda contra el suelo. ? Para realizar este ejercicio, puede colocar una toalla pequea debajo de la parte inferior de la espalda y presionar la espalda contra la toalla. 3. Mantenga esta posicin durante __________ segundos. 4. Relaje totalmente los msculos antes de repetir el ejercicio. Repita __________ veces. Realice este ejercicio __________ veces al da. EjercicioE: Elevaciones alternadas de pierna y brazo  1. [place] manos y las rodillas sobre una superficie firme. Si se colocar sobre una superficie muy dura, puede usar un elemento acolchado para apoyar las rodillas, como una alfombrilla para ejercicios. 2. Alinee los brazos y las piernas. Las manos deben estar debajo de los hombros y las rodillas debajo de la cadera. 3. Eleve la pierna izquierda hacia atrs. Al mismo tiempo, eleve el brazo derecho y Engineer, petroleum frente a usted. ? No eleve la pierna por encima de la cadera. ? No eleve el brazo por encima del hombro. ? Mantenga los msculos del abdomen y de la espalda contrados. ? Mantenga la cadera [employer] el suelo. ? No arquee la espalda. ? Mantenga el equilibrio con cuidado y no contenga la respiracin. 4. Mantenga esta posicin durante __________ segundos. 5. Lentamente regrese a la posicin inicial y repita el ejercicio con la pierna derecha y el brazo izquierdo. Repita __________ veces. Realice este ejercicio __________ veces al da. Postura y Corporate treasurer se refiere a los movimientos y a las posiciones del cuerpo  mientras realiza las actividades diarias. La postura es una parte de la Therapist, nutritional. La buena postura y la Engineer, agricultural corporal saludable pueden ayudar a Theatre stage manager estrs en las articulaciones y los tejidos del cuerpo. La buena postura significa que la columna mantiene su posicin natural de curvatura en forma de S (la columna est en una posicin neutral), los hombros Lucianne Lei un poco hacia atrs y la cabeza no se inclina hacia adelante. A continuacin, se incluyen pautas generales para mejorar la postura y Quarry manager en las actividades diarias. De pie   Al estar de pie, mantenga la columna en la posicin neutral y los pies separados al ancho de caderas, aproximadamente. Mantenga las rodillas ligeramente flexionadas. [place], los hombros y las caderas deben estar alineados.  Cuando realice una tarea en la que deba estar de pie en el mismo sitio durante mucho tiempo, coloque un pie en un objeto estable de 2 a 4pulgadas (5a 10cm) de alto, como un taburete. Esto ayuda a que la columna mantenga una posicin neutral. Sentado   Cuando est sentado, mantenga la columna en posicin neutral y deje los pies apoyados en el suelo. Use un apoyapis, si es necesario, y [employer] muslos paralelos al suelo. Evite redondear los hombros e inclinar la cabeza hacia adelante.  Cuando trabaje en un escritorio o con una computadora, el escritorio debe estar a Scientific laboratory technician  en la que las manos estn un poco ms abajo que los codos. Deslice la silla debajo del escritorio, de modo de estar lo suficientemente cerca como para mantener una buena [place]postura.  Cuando trabaje con una computadora, coloque el monitor a una altura que le permita mirar derecho hacia adelante, sin tener que inclinar la cabeza hacia adelante o [place] atrs. Reposo   Al descansar o estar acostado, evite las posiciones que le causen ms dolor.  Si siente dolor al hacer actividades que exigen sentarse, inclinarse, agacharse o ponerse en  cuclillas (actividades basadas en la flexin), acustese en una posicin en la que el cuerpo no deba doblarse mucho. Por ejemplo, evite acurrucarse de costado con los brazos y las rodillas cerca del pecho (posicin fetal).  Si siente dolor con las actividades que exigen estar de pie durante mucho tiempo o Furniture conservator/restorerestirar los brazos (actividades basadas en la extensin), acustese con la columna en una posicin neutral y flexione ligeramente las rodillas. Pruebe con las siguientes posiciones: ? Teacher, musicAcostarse de costado con una almohada entre las rodillas. ? Acostarse boca arriba con una almohada debajo de las rodillas. Levantar objetos   Cuando tenga que levantar un objeto, mantenga los pies separados el ancho de los hombros y apriete los msculos abdominales.  Flexione las rodillas y la cadera, y Dietitianmantenga la columna en posicin neutral. Es importante levantarse utilizando la fuerza de las piernas, no de la espalda. No extienda completamente las rodillas.  Siempre pida ayuda a otra persona para levantar objetos pesados o incmodos. Esta informacin no tiene Theme park managercomo fin reemplazar el consejo del mdico. Asegrese de hacerle al mdico cualquier pregunta que tenga. Document Released: 07/17/2009 Document Revised: 06/18/2017 Document Reviewed: 04/14/2015 Elsevier Interactive Patient Education  2019 [employer]
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
## 2019-05-07 ENCOUNTER — Other Ambulatory Visit: Payer: Self-pay

## 2019-05-07 ENCOUNTER — Encounter: Payer: Self-pay | Admitting: Family Medicine

## 2019-05-07 ENCOUNTER — Ambulatory Visit (INDEPENDENT_AMBULATORY_CARE_PROVIDER_SITE_OTHER): Payer: Commercial Managed Care - PPO | Admitting: Family Medicine

## 2019-05-07 VITALS — BP 135/86 | HR 89 | Temp 98.9°F | Ht 73.0 in | Wt 314.0 lb

## 2019-05-07 DIAGNOSIS — I10 Essential (primary) hypertension: Secondary | ICD-10-CM | POA: Diagnosis not present

## 2019-05-07 DIAGNOSIS — Z7189 Other specified counseling: Secondary | ICD-10-CM | POA: Diagnosis not present

## 2019-05-07 DIAGNOSIS — E7800 Pure hypercholesterolemia, unspecified: Secondary | ICD-10-CM | POA: Diagnosis not present

## 2019-05-07 DIAGNOSIS — E119 Type 2 diabetes mellitus without complications: Secondary | ICD-10-CM

## 2019-05-07 MED ORDER — LISINOPRIL 20 MG PO TABS: 20.0000 mg | ORAL_TABLET | Freq: Every day | ORAL | 1 refills | Status: DC
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
## 2019-05-08 LAB — HEMOGLOBIN A1C
Est. average glucose Bld gHb Est-mCnc: 131 mg/dL
Hgb A1c MFr Bld: 6.2 % — ABNORMAL HIGH (ref 4.8–5.6)

## 2019-05-08 LAB — CMP14+EGFR
ALT: 28 IU/L (ref 0–44)
AST: 18 IU/L (ref 0–40)
Albumin/Globulin Ratio: 1.7 (ref 1.2–2.2)
Albumin: 4.4 g/dL (ref 3.8–4.9)
Alkaline Phosphatase: 82 IU/L (ref 39–117)
BUN/Creatinine Ratio: 11 (ref 9–20)
BUN: 9 mg/dL (ref 6–24)
Bilirubin Total: 0.3 mg/dL (ref 0.0–1.2)
CO2: 24 mmol/L (ref 20–29)
Calcium: 9.3 mg/dL (ref 8.7–10.2)
Chloride: 104 mmol/L (ref 96–106)
Creatinine, Ser: 0.82 mg/dL (ref 0.76–1.27)
GFR calc Af Amer: 112 mL/min/{1.73_m2} (ref >59)
GFR calc non Af Amer: 97 mL/min/{1.73_m2} (ref >59)
Globulin, Total: 2.6 g/dL (ref 1.5–4.5)
Glucose: 92 mg/dL (ref 65–99)
Potassium: 4.4 mmol/L (ref 3.5–5.2)
Sodium: 142 mmol/L (ref 134–144)
Total Protein: 7 g/dL (ref 6.0–8.5)

## 2019-05-08 LAB — LIPID PANEL
Chol/HDL Ratio: 3.8 ratio (ref 0.0–5.0)
Cholesterol, Total: 132 mg/dL (ref 100–199)
HDL: 35 mg/dL — ABNORMAL LOW (ref >39)
LDL Chol Calc (NIH): 71 mg/dL (ref 0–99)
Triglycerides: 149 mg/dL (ref 0–149)
VLDL Cholesterol Cal: 26 mg/dL (ref 5–40)

## 2019-05-10 DIAGNOSIS — Z23 Encounter for immunization: Secondary | ICD-10-CM | POA: Diagnosis not present

## 2019-05-18 ENCOUNTER — Other Ambulatory Visit: Payer: Self-pay

## 2019-05-18 ENCOUNTER — Encounter: Payer: Self-pay | Admitting: Radiology

## 2019-05-18 DIAGNOSIS — E785 Hyperlipidemia, unspecified: Secondary | ICD-10-CM

## 2019-05-18 DIAGNOSIS — E119 Type 2 diabetes mellitus without complications: Secondary | ICD-10-CM

## 2019-05-18 MED ORDER — METFORMIN HCL 1000 MG PO TABS: 1000.0000 mg | ORAL_TABLET | Freq: Two times a day (BID) | ORAL | 3 refills | Status: DC

## 2019-05-18 MED ORDER — SIMVASTATIN 40 MG PO TABS: 40.0000 mg | ORAL_TABLET | Freq: Every evening | ORAL | 3 refills | Status: DC

## 2019-07-24 ENCOUNTER — Other Ambulatory Visit: Payer: Self-pay | Admitting: Family Medicine

## 2019-07-24 DIAGNOSIS — I10 Essential (primary) hypertension: Secondary | ICD-10-CM

## 2019-10-10 ENCOUNTER — Other Ambulatory Visit: Payer: Self-pay | Admitting: Family Medicine

## 2019-11-05 ENCOUNTER — Encounter: Payer: Self-pay | Admitting: Family Medicine

## 2019-11-05 ENCOUNTER — Ambulatory Visit (INDEPENDENT_AMBULATORY_CARE_PROVIDER_SITE_OTHER): Payer: Commercial Managed Care - PPO | Admitting: Family Medicine

## 2019-11-05 ENCOUNTER — Other Ambulatory Visit: Payer: Self-pay

## 2019-11-05 VITALS — BP 135/83 | HR 83 | Temp 97.8°F | Ht 73.0 in | Wt 323.0 lb

## 2019-11-05 DIAGNOSIS — E7800 Pure hypercholesterolemia, unspecified: Secondary | ICD-10-CM | POA: Diagnosis not present

## 2019-11-05 DIAGNOSIS — R809 Proteinuria, unspecified: Secondary | ICD-10-CM

## 2019-11-05 DIAGNOSIS — E1129 Type 2 diabetes mellitus with other diabetic kidney complication: Secondary | ICD-10-CM | POA: Diagnosis not present

## 2019-11-05 DIAGNOSIS — I10 Essential (primary) hypertension: Secondary | ICD-10-CM

## 2019-11-05 DIAGNOSIS — G4733 Obstructive sleep apnea (adult) (pediatric): Secondary | ICD-10-CM | POA: Diagnosis not present

## 2019-11-05 NOTE — Patient Instructions (Signed)
° ° ° °  If you have lab work done today you will be contacted with your lab results within the next 2 weeks.  If you have not heard from us then please contact us. The fastest way to get your results is to register for My Chart. ° ° °IF you received an x-ray today, you will receive an invoice from [facility]. Please contact [facility] at [phone] with questions or concerns regarding your invoice.  ° °IF you received labwork today, you will receive an invoice from [facility]. Please contact [facility] at [phone] with questions or concerns regarding your invoice.  ° °Our billing staff will not be able to assist you with questions regarding bills from these companies. ° °You will be contacted with the lab results as soon as they are available. The fastest way to get your results is to activate your My Chart account. Instructions are located on the last page of this paperwork. If you have not heard from us regarding the results in 2 weeks, please contact this office. °  ° ° ° °
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
## 2019-11-06 LAB — MICROALBUMIN / CREATININE URINE RATIO
Creatinine, Urine: 130.3 mg/dL
Microalb/Creat Ratio: 19 mg/g creat (ref 0–29)
Microalbumin, Urine: 25.4 ug/mL

## 2019-11-06 LAB — CBC
Hematocrit: 48.7 % (ref 37.5–51.0)
Hemoglobin: 15.5 g/dL (ref 13.0–17.7)
MCH: 26.9 pg (ref 26.6–33.0)
MCHC: 31.8 g/dL (ref 31.5–35.7)
MCV: 84 fL (ref 79–97)
Platelets: 287 10*3/uL (ref 150–450)
RBC: 5.77 x10E6/uL (ref 4.14–5.80)
RDW: 13.1 % (ref 11.6–15.4)
WBC: 10.9 10*3/uL — ABNORMAL HIGH (ref 3.4–10.8)

## 2019-11-06 LAB — CMP14+EGFR
ALT: 22 IU/L (ref 0–44)
AST: 12 IU/L (ref 0–40)
Albumin/Globulin Ratio: 1.6 (ref 1.2–2.2)
Albumin: 4.2 g/dL (ref 3.8–4.9)
Alkaline Phosphatase: 78 IU/L (ref 39–117)
BUN/Creatinine Ratio: 12 (ref 10–24)
BUN: 10 mg/dL (ref 8–27)
Bilirubin Total: 0.3 mg/dL (ref 0.0–1.2)
CO2: 23 mmol/L (ref 20–29)
Calcium: 9.8 mg/dL (ref 8.6–10.2)
Chloride: 105 mmol/L (ref 96–106)
Creatinine, Ser: 0.83 mg/dL (ref 0.76–1.27)
GFR calc Af Amer: 111 mL/min/{1.73_m2} (ref >59)
GFR calc non Af Amer: 96 mL/min/{1.73_m2} (ref >59)
Globulin, Total: 2.6 g/dL (ref 1.5–4.5)
Glucose: 93 mg/dL (ref 65–99)
Potassium: 4.2 mmol/L (ref 3.5–5.2)
Sodium: 144 mmol/L (ref 134–144)
Total Protein: 6.8 g/dL (ref 6.0–8.5)

## 2019-11-06 LAB — LIPID PANEL
Chol/HDL Ratio: 3.4 ratio (ref 0.0–5.0)
Cholesterol, Total: 120 mg/dL (ref 100–199)
HDL: 35 mg/dL — ABNORMAL LOW (ref >39)
LDL Chol Calc (NIH): 61 mg/dL (ref 0–99)
Triglycerides: 135 mg/dL (ref 0–149)
VLDL Cholesterol Cal: 24 mg/dL (ref 5–40)

## 2019-11-06 LAB — HEMOGLOBIN A1C
Est. average glucose Bld gHb Est-mCnc: 134 mg/dL
Hgb A1c MFr Bld: 6.3 % — ABNORMAL HIGH (ref 4.8–5.6)

## 2019-11-25 ENCOUNTER — Encounter: Payer: Self-pay | Admitting: Radiology

## 2020-01-20 ENCOUNTER — Other Ambulatory Visit: Payer: Self-pay | Admitting: Family Medicine

## 2020-01-20 DIAGNOSIS — K219 Gastro-esophageal reflux disease without esophagitis: Secondary | ICD-10-CM

## 2020-02-20 ENCOUNTER — Other Ambulatory Visit: Payer: Self-pay | Admitting: Family Medicine

## 2020-02-20 NOTE — Telephone Encounter (Signed)
Requested Prescriptions  Pending Prescriptions Disp Refills  . [place] 2 MG/0.85ML AUIJ [Pharmacy Med Name: BYDUREON BCISE AUTOINJECTR 0.85ML4S 2MG] 10.2 mL 0    Sig: INJECT 2 MG UNDER THE SKIN ONCE WEEKLY     Endocrinology:  Diabetes - GLP-1 Receptor Agonists - exenatide Passed - 02/20/2020  6:30 AM      Passed - HBA1C is between 0 and 7.9 and within 180 days    Hgb A1c MFr Bld  Date Value Ref Range Status  11/05/2019 6.3 (H) 4.8 - 5.6 % Final    Comment:             Prediabetes: 5.7 - 6.4          Diabetes: >6.4          Glycemic control for adults with diabetes: <7.0          Passed - Cr in normal range and within 360 days    Creat  Date Value Ref Range Status  10/13/2013 0.81 0.50 - 1.35 mg/dL Final   Creatinine, Ser  Date Value Ref Range Status  11/05/2019 0.83 0.76 - 1.27 mg/dL Final   Creatinine, Urine  Date Value Ref Range Status  09/23/2013 134.4 mg/dL Final         Passed - eGFR in normal range and within 360 days    GFR, Est African American  Date Value Ref Range Status  10/13/2013 >89 mL/min Final   GFR calc Af Amer  Date Value Ref Range Status  11/05/2019 111 >59 mL/min/1.73 Final   GFR, Est Non African American  Date Value Ref Range Status  10/13/2013 >89 mL/min Final    Comment:      The estimated GFR is a calculation valid for adults (>=51 years old) that uses the CKD-EPI algorithm to adjust for age and sex. It is   not to be used for children, pregnant women, hospitalized patients,    patients on dialysis, or with rapidly changing kidney function. According to the NKDEP, eGFR >89 is normal, 60-89 shows mild impairment, 30-59 shows moderate impairment, 15-29 shows severe impairment and <15 is ESRD.     GFR calc non Af Amer  Date Value Ref Range Status  11/05/2019 96 >59 mL/min/1.73 Final         Passed - Valid encounter within last 6 months    Recent Outpatient Visits          3 months ago Type 2 diabetes mellitus with  microalbuminuria, without long-term current use of insulin [facility])   [facility] at [facility], Lilia Argue, MD   9 months ago Essential hypertension   [facility] at [facility], Lilia Argue, MD   1 year ago Essential hypertension   [facility] at [facility], Lilia Argue, MD   1 year ago Sciatica of left side   Primary Care at [facility], Ines Bloomer, MD   1 year ago Type 2 diabetes, uncontrolled, with renal manifestation [facility])   [facility] at [facility], Lilia Argue, MD      Future Appointments            In 2 months Rutherford Guys, MD Primary Care at [place], [facility]

## 2020-04-14 ENCOUNTER — Other Ambulatory Visit: Payer: Self-pay

## 2020-04-14 ENCOUNTER — Encounter: Payer: Self-pay | Admitting: Family Medicine

## 2020-04-14 ENCOUNTER — Ambulatory Visit (INDEPENDENT_AMBULATORY_CARE_PROVIDER_SITE_OTHER): Payer: Commercial Managed Care - PPO | Admitting: Family Medicine

## 2020-04-14 VITALS — BP 140/83 | HR 89 | Temp 97.4°F | Ht 73.0 in | Wt 325.4 lb

## 2020-04-14 DIAGNOSIS — M940 Chondrocostal junction syndrome [Tietze]: Secondary | ICD-10-CM | POA: Diagnosis not present

## 2020-04-14 DIAGNOSIS — R071 Chest pain on breathing: Secondary | ICD-10-CM

## 2020-04-14 MED ORDER — MELOXICAM 15 MG PO TABS: 15.0000 mg | ORAL_TABLET | Freq: Every day | ORAL | 1 refills | Status: DC | PRN
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
## 2020-04-14 NOTE — Patient Instructions (Addendum)
If you have lab work done today you will be contacted with your lab results within the next 2 weeks.  If you have not heard from [place] then please contact us. The fastest way to get your results is to register for My Chart.   IF you received an x-ray today, you will receive an invoice from [facility]. Please contact [facility] at [phone] with questions or concerns regarding your invoice.   IF you received labwork today, you will receive an invoice from [facility]. Please contact [facility] at [phone] with questions or concerns regarding your invoice.   Our billing staff will not be able to assist you with questions regarding bills from these companies.  You will be contacted with the lab results as soon as they are available. The fastest way to get your results is to activate your My Chart account. Instructions are located on the last page of this paperwork. If you have not heard from [place] regarding the results in 2 weeks, please contact this office.     Costocondritis Costochondritis  La costocondritis es la hinchazn e irritacin (inflamacin) del tejido Building surveyor) que une las costillas con el esternn. Esto causa dolor en la parte frontal del pecho. Por lo general, el dolor empieza de [place] gradual y compromete ms de [place]. Cules son las causas? No siempre se conoce la causa exacta de esta afeccin. Se debe a la sobrecarga en el cartlago donde las costillas se unen con el esternn. La causa de esta sobrecarga puede ser la siguiente:  Lesin en el trax (traumatismo).  Ejercicios o actividades relacionadas con [employer].  Tos intensa. Qu incrementa el riesgo? Puede tener un riesgo ms alto de sufrir esta afeccin si:  Es Nurse, learning disability.  Tiene entre 30 y 56aos.  Comenz un nuevo ejercicio o una nueva actividad recientemente.  Tiene niveles bajos de vitamina D.  Tiene una afeccin que lo hace toser con frecuencia. Cules son los signos  o sntomas? El sntoma principal de esta afeccin es el dolor en el pecho. El dolor:  Por lo general, comienza de [place] gradual y puede ser penetrante o sordo.  Empeora al respirar, toser o hacer ejercicio.  Mejora con el reposo.  Puede empeorar al presionar en Immunologist donde se unen el esternn con las costillas (dolor a la palpacin). Cmo se diagnostica? Esta afeccin se diagnostica en funcin de los sntomas, los antecedentes mdicos y un examen fsico. El mdico determinar si tiene dolor a la palpacin al presionarle el esternn. Este es el signo ms importante. Es posible que tambin le hagan anlisis para descartar otras posibles causas. Estas pueden incluir:  Una radiografa de trax para verificar si tiene problemas pulmonares.  Un electrocardiograma (ECG) para verificar si tiene algn problema cardaco que podra causarle el dolor.  Un estudio de diagnstico por imgenes para descartar una fractura de trax o de costilla. Cmo se trata? Generalmente, esta afeccin desaparece con el paso tiempo, sin tratamiento. El mdico puede recetarle antiinflamatorios no esteroides (AINE) para Engineer, materials y la inflamacin. El mdico tambin puede recomendarle lo siguiente:  Radio producer reposo y Automotive engineer las actividades que empeoren el dolor.  Aplicar calor o fro en la zona para Engineer, materials y la inflamacin.  Hacer ejercicios para elongar los msculos del trax. Si estos tratamientos no ayudan, es posible que el mdico le inyecte un anestsico en Immunologist donde se unen el esternn con las costillas para Engineer, materials. Siga estas indicaciones  en su casa:  Evite las actividades que [employer]. Esto incluye cualquier [employer] del trax, del abdomen y los laterales.  Si se lo indican, aplique hielo sobre la zona del dolor: ? Nature conservation officer hielo en una bolsa plstica. ? Coloque una [employer] piel y la bolsa de hielo. ? Coloque el hielo durante  , de 2 a 3veces por da.  Si se lo indican, aplique calor en la zona afectada con la frecuencia que le haya indicado el mdico. Use la fuente de calor que el mdico le recomiende, como una compresa de calor hmedo o una almohadilla trmica. ? Coloque una [employer] piel y la fuente de Airline pilot. ? Aplique el calor durante 20 a . ? Retire la fuente de calor si la piel se pone de color rojo brillante. Esto es muy importante si no puede Financial risk analyst, calor o fro. Puede correr un riesgo mayor de sufrir quemaduras.  Tome los medicamentos de venta libre y los recetados solamente como se lo haya indicado el mdico.  Reanude sus actividades normales segn lo indicado por el mdico. Pregntele al mdico qu actividades son seguras para usted.  Concurra a todas las visitas de control como se lo haya indicado el mdico. Esto es importante. Comunquese con un mdico si:  Tiene escalofros o fiebre.  El dolor persiste o [place].  Tiene tos que no desaparece (es persistente). Solicite ayuda de inmediato si:  Le falta el aire. Esta informacin no tiene Theme park manager el consejo del mdico. Asegrese de hacerle al mdico cualquier pregunta que tenga. Document Revised: 11/07/2017 Document Reviewed: 11/22/2015 Elsevier Patient Education  2020 [employer].

## 2020-04-24 ENCOUNTER — Other Ambulatory Visit: Payer: Self-pay | Admitting: Family Medicine

## 2020-04-24 DIAGNOSIS — E119 Type 2 diabetes mellitus without complications: Secondary | ICD-10-CM

## 2020-04-24 DIAGNOSIS — E785 Hyperlipidemia, unspecified: Secondary | ICD-10-CM

## 2020-05-05 ENCOUNTER — Encounter: Payer: Self-pay | Admitting: Family Medicine

## 2020-05-05 ENCOUNTER — Other Ambulatory Visit: Payer: Self-pay

## 2020-05-05 ENCOUNTER — Ambulatory Visit (INDEPENDENT_AMBULATORY_CARE_PROVIDER_SITE_OTHER): Payer: Commercial Managed Care - PPO | Admitting: Family Medicine

## 2020-05-05 VITALS — BP 137/86 | HR 80 | Temp 97.6°F | Ht 73.0 in | Wt 325.0 lb

## 2020-05-05 DIAGNOSIS — Z23 Encounter for immunization: Secondary | ICD-10-CM

## 2020-05-05 DIAGNOSIS — R809 Proteinuria, unspecified: Secondary | ICD-10-CM

## 2020-05-05 DIAGNOSIS — E1129 Type 2 diabetes mellitus with other diabetic kidney complication: Secondary | ICD-10-CM

## 2020-05-05 DIAGNOSIS — E785 Hyperlipidemia, unspecified: Secondary | ICD-10-CM | POA: Diagnosis not present

## 2020-05-05 DIAGNOSIS — I10 Essential (primary) hypertension: Secondary | ICD-10-CM

## 2020-05-05 DIAGNOSIS — K219 Gastro-esophageal reflux disease without esophagitis: Secondary | ICD-10-CM

## 2020-05-05 LAB — POCT GLYCOSYLATED HEMOGLOBIN (HGB A1C): Hemoglobin A1C: 6.5 % — AB (ref 4.0–5.6)

## 2020-05-05 MED ORDER — PANTOPRAZOLE SODIUM 40 MG PO TBEC: 40.0000 mg | DELAYED_RELEASE_TABLET | Freq: Every day | ORAL | 1 refills | Status: DC

## 2020-05-05 MED ORDER — SIMVASTATIN 40 MG PO TABS: 40.0000 mg | ORAL_TABLET | Freq: Every evening | ORAL | 1 refills | Status: DC

## 2020-05-05 MED ORDER — METFORMIN HCL 1000 MG PO TABS: 1000.0000 mg | ORAL_TABLET | Freq: Two times a day (BID) | ORAL | 1 refills | Status: DC

## 2020-05-05 MED ORDER — LISINOPRIL 20 MG PO TABS: 20.0000 mg | ORAL_TABLET | Freq: Every day | ORAL | 1 refills | Status: DC

## 2020-05-05 MED ORDER — BYDUREON BCISE 2 MG/0.85ML ~~LOC~~ AUIJ
AUTO-INJECTOR | SUBCUTANEOUS | 1 refills | Status: DC
Start: 2020-05-05 — End: 2020-11-02

## 2020-05-05 NOTE — Patient Instructions (Signed)
° ° ° °  If you have lab work done today you will be contacted with your lab results within the next 2 weeks.  If you have not heard from us then please contact us. The fastest way to get your results is to register for My Chart. ° ° °IF you received an x-ray today, you will receive an invoice from [facility]. Please contact [facility] at [phone] with questions or concerns regarding your invoice.  ° °IF you received labwork today, you will receive an invoice from [facility]. Please contact [facility] at [phone] with questions or concerns regarding your invoice.  ° °Our billing staff will not be able to assist you with questions regarding bills from these companies. ° °You will be contacted with the lab results as soon as they are available. The fastest way to get your results is to activate your My Chart account. Instructions are located on the last page of this paperwork. If you have not heard from us regarding the results in 2 weeks, please contact this office. °  ° ° ° °
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
## 2020-05-06 LAB — CMP14+EGFR
ALT: 41 IU/L (ref 0–44)
AST: 19 IU/L (ref 0–40)
Albumin/Globulin Ratio: 1.5 (ref 1.2–2.2)
Albumin: 4.1 g/dL (ref 3.8–4.9)
Alkaline Phosphatase: 80 IU/L (ref 44–121)
BUN/Creatinine Ratio: 11 (ref 10–24)
BUN: 9 mg/dL (ref 8–27)
Bilirubin Total: 0.4 mg/dL (ref 0.0–1.2)
CO2: 25 mmol/L (ref 20–29)
Calcium: 9.2 mg/dL (ref 8.6–10.2)
Chloride: 104 mmol/L (ref 96–106)
Creatinine, Ser: 0.79 mg/dL (ref 0.76–1.27)
GFR calc Af Amer: 113 mL/min/{1.73_m2} (ref >59)
GFR calc non Af Amer: 98 mL/min/{1.73_m2} (ref >59)
Globulin, Total: 2.8 g/dL (ref 1.5–4.5)
Glucose: 120 mg/dL — ABNORMAL HIGH (ref 65–99)
Potassium: 4.4 mmol/L (ref 3.5–5.2)
Sodium: 142 mmol/L (ref 134–144)
Total Protein: 6.9 g/dL (ref 6.0–8.5)

## 2020-05-06 LAB — LIPID PANEL
Chol/HDL Ratio: 4 ratio (ref 0.0–5.0)
Cholesterol, Total: 129 mg/dL (ref 100–199)
HDL: 32 mg/dL — ABNORMAL LOW (ref >39)
LDL Chol Calc (NIH): 74 mg/dL (ref 0–99)
Triglycerides: 129 mg/dL (ref 0–149)
VLDL Cholesterol Cal: 23 mg/dL (ref 5–40)

## 2020-09-27 ENCOUNTER — Encounter: Payer: Self-pay | Admitting: Registered Nurse

## 2020-09-27 ENCOUNTER — Other Ambulatory Visit: Payer: Self-pay

## 2020-09-27 ENCOUNTER — Ambulatory Visit
Admission: RE | Admit: 2020-09-27 | Discharge: 2020-09-27 | Disposition: A | Discharge status: Home / Self Care | Payer: Commercial Managed Care - PPO | Source: Ambulatory Visit | Attending: Registered Nurse | Admitting: Registered Nurse

## 2020-09-27 ENCOUNTER — Telehealth (INDEPENDENT_AMBULATORY_CARE_PROVIDER_SITE_OTHER): Payer: Commercial Managed Care - PPO | Admitting: Registered Nurse

## 2020-09-27 VITALS — Temp 97.6°F

## 2020-09-27 DIAGNOSIS — R059 Cough, unspecified: Secondary | ICD-10-CM

## 2020-09-27 DIAGNOSIS — Z8709 Personal history of other diseases of the respiratory system: Secondary | ICD-10-CM

## 2020-09-27 DIAGNOSIS — B9789 Other viral agents as the cause of diseases classified elsewhere: Secondary | ICD-10-CM | POA: Diagnosis not present

## 2020-09-27 DIAGNOSIS — J988 Other specified respiratory disorders: Secondary | ICD-10-CM

## 2020-09-27 MED ORDER — PREDNISONE 10 MG (21) PO TBPK: ORAL_TABLET | ORAL | 0 refills | Status: DC

## 2020-09-27 MED ORDER — ALBUTEROL SULFATE HFA 108 (90 BASE) MCG/ACT IN AERS: 2.0000 | INHALATION_SPRAY | Freq: Four times a day (QID) | RESPIRATORY_TRACT | 0 refills | Status: DC | PRN

## 2020-09-27 MED ORDER — HYDROCODONE-HOMATROPINE 5-1.5 MG/5ML PO SYRP: 5.0000 mL | ORAL_SOLUTION | Freq: Every evening | ORAL | 0 refills | Status: DC | PRN

## 2020-09-27 MED ORDER — BENZONATATE 100 MG PO CAPS: 100.0000 mg | ORAL_CAPSULE | Freq: Two times a day (BID) | ORAL | 0 refills | Status: DC | PRN

## 2020-09-27 NOTE — Patient Instructions (Signed)
° ° ° °  If you have lab work done today you will be contacted with your lab results within the next 2 weeks.  If you have not heard from us then please contact us. The fastest way to get your results is to register for My Chart. ° ° °IF you received an x-ray today, you will receive an invoice from [facility]. Please contact [facility] at [phone] with questions or concerns regarding your invoice.  ° °IF you received labwork today, you will receive an invoice from [facility]. Please contact [facility] at [phone] with questions or concerns regarding your invoice.  ° °Our billing staff will not be able to assist you with questions regarding bills from these companies. ° °You will be contacted with the lab results as soon as they are available. The fastest way to get your results is to activate your My Chart account. Instructions are located on the last page of this paperwork. If you have not heard from us regarding the results in 2 weeks, please contact this office. °  ° ° ° °
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
## 2020-09-28 ENCOUNTER — Telehealth: Payer: Self-pay | Admitting: Registered Nurse

## 2020-09-28 NOTE — Telephone Encounter (Signed)
Patient was informed xray result are not in at this time and Brian Reilly was out of the office today. We will contact him once Brian Reilly has reviewed and sign off on the xray.

## 2020-09-28 NOTE — Telephone Encounter (Signed)
Pt states he went for xray yesterday and has not heard back from the dr. The order is there, but no result.  Pt states he went to GI.

## 2020-09-29 NOTE — Progress Notes (Signed)
If we could call patient -   Minimal atelectasis of left lung base - may indicate some infection occurring. Should he fail to improve, he should call me.  Thank you  Rich

## 2020-09-29 NOTE — Telephone Encounter (Signed)
If we could call patient -   Minimal atelectasis of left lung base - may indicate some infection occurring. Should he fail to improve, he should call me.  Thank you  Rich

## 2020-10-02 ENCOUNTER — Telehealth: Payer: Self-pay | Admitting: Registered Nurse

## 2020-10-02 NOTE — Telephone Encounter (Signed)
Please send message to Janeece Agee.  Thanks.

## 2020-10-02 NOTE — Telephone Encounter (Signed)
Please advise previous message  

## 2020-10-02 NOTE — Telephone Encounter (Signed)
Pt was advised if he wasn't feeling better to call so provider can change his medication/ Pt called and stated he still is feeling the same as last week and no better / Pt still has cough and chest congestion / please advise asap

## 2020-10-02 NOTE — Telephone Encounter (Signed)
Patient stated he is not getting any better he still about the same is there anything else you can do? Or medication he can take?

## 2020-10-04 NOTE — Telephone Encounter (Signed)
Please advise pt's previous message.

## 2020-11-02 ENCOUNTER — Telehealth: Payer: Self-pay | Admitting: *Deleted

## 2020-11-02 ENCOUNTER — Encounter: Payer: Self-pay | Admitting: Emergency Medicine

## 2020-11-02 ENCOUNTER — Ambulatory Visit (INDEPENDENT_AMBULATORY_CARE_PROVIDER_SITE_OTHER): Payer: Commercial Managed Care - PPO | Admitting: Emergency Medicine

## 2020-11-02 ENCOUNTER — Other Ambulatory Visit: Payer: Self-pay

## 2020-11-02 VITALS — BP 144/84 | HR 80 | Temp 97.6°F | Resp 16 | Ht 73.0 in | Wt 323.0 lb

## 2020-11-02 DIAGNOSIS — Z9989 Dependence on other enabling machines and devices: Secondary | ICD-10-CM

## 2020-11-02 DIAGNOSIS — I10 Essential (primary) hypertension: Secondary | ICD-10-CM

## 2020-11-02 DIAGNOSIS — E785 Hyperlipidemia, unspecified: Secondary | ICD-10-CM | POA: Diagnosis not present

## 2020-11-02 DIAGNOSIS — G4733 Obstructive sleep apnea (adult) (pediatric): Secondary | ICD-10-CM

## 2020-11-02 DIAGNOSIS — R809 Proteinuria, unspecified: Secondary | ICD-10-CM | POA: Diagnosis not present

## 2020-11-02 DIAGNOSIS — E1159 Type 2 diabetes mellitus with other circulatory complications: Secondary | ICD-10-CM | POA: Diagnosis not present

## 2020-11-02 DIAGNOSIS — E1129 Type 2 diabetes mellitus with other diabetic kidney complication: Secondary | ICD-10-CM | POA: Diagnosis not present

## 2020-11-02 DIAGNOSIS — I152 Hypertension secondary to endocrine disorders: Secondary | ICD-10-CM

## 2020-11-02 DIAGNOSIS — K219 Gastro-esophageal reflux disease without esophagitis: Secondary | ICD-10-CM

## 2020-11-02 DIAGNOSIS — Z7689 Persons encountering health services in other specified circumstances: Secondary | ICD-10-CM

## 2020-11-02 DIAGNOSIS — E1169 Type 2 diabetes mellitus with other specified complication: Secondary | ICD-10-CM

## 2020-11-02 LAB — POCT GLYCOSYLATED HEMOGLOBIN (HGB A1C): Hemoglobin A1C: 6.5 % — AB (ref 4.0–5.6)

## 2020-11-02 LAB — GLUCOSE, POCT (MANUAL RESULT ENTRY): POC Glucose: 124 mg/dl — AB (ref 70–99)

## 2020-11-02 MED ORDER — BYDUREON BCISE 2 MG/0.85ML ~~LOC~~ AUIJ: AUTO-INJECTOR | SUBCUTANEOUS | 1 refills | Status: DC

## 2020-11-02 MED ORDER — METFORMIN HCL 1000 MG PO TABS: 1000.0000 mg | ORAL_TABLET | Freq: Two times a day (BID) | ORAL | 3 refills | Status: DC

## 2020-11-02 MED ORDER — LISINOPRIL 20 MG PO TABS: 20.0000 mg | ORAL_TABLET | Freq: Every day | ORAL | 3 refills | Status: DC

## 2020-11-02 MED ORDER — SIMVASTATIN 40 MG PO TABS: 40.0000 mg | ORAL_TABLET | Freq: Every evening | ORAL | 3 refills | Status: DC

## 2020-11-02 NOTE — Assessment & Plan Note (Signed)
Well-controlled hypertension.  Continue present medication. Well-controlled diabetes with hemoglobin A1c of 6.5.  Continue present medications.  No changes. Diet and nutrition discussed.  Patient resistant to weight loss recommendations. Continue statin therapy. Follow-up in 3 months.

## 2020-11-02 NOTE — Patient Instructions (Addendum)
   If you have lab work done today you will be contacted with your lab results within the next 2 weeks.  If you have not heard from us then please contact us. The fastest way to get your results is to register for My Chart.   IF you received an x-ray today, you will receive an invoice from [facility]. Please contact [facility] at [phone] with questions or concerns regarding your invoice.   IF you received labwork today, you will receive an invoice from [facility]. Please contact [facility] at [phone] with questions or concerns regarding your invoice.   Our billing staff will not be able to assist you with questions regarding bills from these companies.  You will be contacted with the lab results as soon as they are available. The fastest way to get your results is to activate your My Chart account. Instructions are located on the last page of this paperwork. If you have not heard from us regarding the results in 2 weeks, please contact this office.     Diabetes mellitus y nutricin, en adultos Diabetes Mellitus and Nutrition, Adult Si sufre de diabetes, o diabetes mellitus, es muy importante tener hbitos alimenticios saludables debido a que sus niveles de azcar en la sangre (glucosa) se ven afectados en gran medida por lo que come y bebe. [place] alimentos saludables en las cantidades correctas, aproximadamente a la misma hora todos los das, lo ayudar a:  Controlar la glucemia.  Disminuir el riesgo de sufrir una enfermedad cardaca.  Mejorar la presin arterial.  Alcanzar o mantener un peso saludable. Qu puede afectar mi plan de alimentacin? Todas las personas que sufren de diabetes son diferentes y cada una tiene necesidades diferentes en cuanto a un plan de alimentacin. El mdico puede recomendarle que trabaje con un nutricionista para elaborar el mejor plan para usted. Su plan de alimentacin puede variar segn factores como:  Las caloras que  necesita.  Los medicamentos que toma.  Su peso.  Sus niveles de glucemia, presin arterial y colesterol.  Su nivel de actividad.  Otras afecciones que tenga, como enfermedades cardacas o renales. Cmo me afectan los carbohidratos? Los carbohidratos, o hidratos de carbono, afectan su nivel de glucemia ms que cualquier otro tipo de alimento. La ingesta de carbohidratos naturalmente aumenta la cantidad de glucosa en la sangre. El recuento de carbohidratos es un mtodo destinado a llevar un registro de la cantidad de carbohidratos que se consumen. El recuento de carbohidratos es importante para mantener la glucemia a un nivel saludable, especialmente si utiliza insulina o toma determinados medicamentos por va oral para la diabetes. Es importante conocer la cantidad de carbohidratos que se pueden ingerir en cada comida sin correr ningn riesgo. Esto es diferente en cada persona. Su nutricionista puede ayudarlo a calcular la cantidad de carbohidratos que debe ingerir en cada comida y en cada refrigerio. Cmo me afecta el alcohol? El alcohol puede provocar disminuciones sbitas de la glucemia (hipoglucemia), especialmente si utiliza insulina o toma determinados medicamentos por va oral para la diabetes. La hipoglucemia es una afeccin potencialmente mortal. Los sntomas de la hipoglucemia, como somnolencia, mareos y confusin, son similares a los sntomas de haber consumido demasiado alcohol.  No beba alcohol si: ? Su mdico le indica no hacerlo. ? Est embarazada, puede estar embarazada o est tratando de quedar embarazada.  Si bebe alcohol: ? No beba con el estmago vaco. ? Limite la cantidad que bebe:  De 0 a 1 medida por da para las   mujeres.  De 0 a 2 medidas por da para los hombres. ? Est atento a la cantidad de alcohol que hay en las bebidas que toma. En los [place], una medida equivale a una botella de cerveza de 12oz (355ml), un vaso de vino de 5oz (148ml) o un vaso  de una bebida alcohlica de alta graduacin de 1oz (44ml). ? Mantngase hidratado bebiendo agua, refrescos dietticos o t helado sin azcar.  Tenga en cuenta que los refrescos comunes, los jugos y otras bebida para mezclar pueden contener mucha azcar y se deben contar como carbohidratos. Consejos para seguir este plan Leer las etiquetas de los alimentos  Comience por leer el tamao de la porcin en la "Informacin nutricional" en las etiquetas de los alimentos envasados y las bebidas. La cantidad de caloras, carbohidratos, grasas y otros nutrientes mencionados en la etiqueta se basan en una porcin del alimento. Muchos alimentos contienen ms de una porcin por envase.  Verifique la cantidad total de gramos (g) de carbohidratos totales en una porcin. Puede calcular la cantidad de porciones de carbohidratos al dividir el total de carbohidratos por 15. Por ejemplo, si un alimento tiene un total de 30g de carbohidratos totales por porcin, equivale a 2 porciones de carbohidratos.  Verifique la cantidad de gramos (g) de grasas saturadas y grasas trans de una porcin. Escoja alimentos que no contengan estas grasas o que su contenido de estas sea bajo.  Verifique la cantidad de miligramos (mg) de sal (sodio) en una porcin. La mayora de las personas deben limitar la ingesta de sodio total a menos de 2300mg por da.  Siempre consulte la informacin nutricional de los alimentos etiquetados como "con bajo contenido de grasa" o "sin grasa". Estos alimentos pueden tener un mayor contenido de azcar agregada o carbohidratos refinados, y deben evitarse.  Hable con su nutricionista para identificar sus objetivos diarios en cuanto a los nutrientes mencionados en la etiqueta. Al ir de compras  Evite comprar alimentos procesados, enlatados o precocidos. Estos alimentos tienden a tener una mayor cantidad de grasa, sodio y azcar agregada.  Compre en la zona exterior de la tienda de comestibles. Esta es  la zona donde se encuentran con mayor frecuencia las frutas y las verduras frescas, los cereales a granel, las carnes frescas y los productos lcteos frescos. Al cocinar  Utilice mtodos de coccin a baja temperatura, como hornear, en lugar de mtodos de coccin a alta temperatura, como frer en abundante aceite.  Cocine con aceites saludables, como el aceite de oliva, canola o girasol.  Evite cocinar con manteca, crema o carnes con alto contenido de grasa. Planificacin de las comidas  Coma las comidas y los refrigerios regularmente, preferentemente a la misma hora todos los das. Evite pasar largos perodos de tiempo sin comer.  Consuma alimentos ricos en fibra, como frutas frescas, verduras, frijoles y cereales integrales. Consulte a su nutricionista sobre cuntas porciones de carbohidratos puede consumir en cada comida.  Consuma entre 4 y 6 onzas (entre 112 y 168g) de protenas magras por da, como carnes magras, pollo, pescado, huevos o tofu. Una onza (oz) de protena magra equivale a: ? 1 onza (28g) de carne, pollo o pescado. ? 1huevo. ?  de taza (62 g) de tofu.  Coma algunos alimentos por da que contengan grasas saludables, como aguacates, frutos secos, semillas y pescado.   Qu alimentos debo comer? Frutas Bayas. Manzanas. Naranjas. Duraznos. Damascos. Ciruelas. Uvas. Mango. Papaya. Granada. Kiwi. Cerezas. Verduras Lechuga. Espinaca. Verduras de hoja verde, que incluyen   col rizada, acelga, hojas de berza y de mostaza. Remolachas. Coliflor. Repollo. Brcoli. Zanahorias. Judas verdes. Tomates. Pimientos. Cebollas. Pepinos. Coles de Bruselas. Granos Granos integrales, como panes, galletas, tortillas, cereales y pastas de salvado o integrales. Avena sin azcar. Quinua. Arroz integral o salvaje. Carnes y otras protenas Mariscos. Carne de ave sin piel. Cortes magros de ave y carne de res. Tofu. Frutos secos. Semillas. Lcteos Productos lcteos sin grasa o con bajo contenido de  grasa, como leche, yogur y queso. Es posible que los productos que se enumeran ms arriba no constituyan una lista completa de los alimentos y las bebidas que puede tomar. Consulte a un nutricionista para obtener ms informacin. Qu alimentos debo evitar? Frutas Frutas enlatadas al almbar. Verduras Verduras enlatadas. Verduras congeladas con mantequilla o salsa de crema. Granos Productos elaborados con harina y harina blanca refinada, como panes, pastas, bocadillos y cereales. Evite todos los alimentos procesados. Carnes y otras protenas Cortes de carne con alto contenido de grasa. Carne de ave con piel. Carnes empanizadas o fritas. Carne procesada. Evite las grasas saturadas. Lcteos Yogur, queso o leche enteros. Bebidas Bebidas azucaradas, como gaseosas o t helado. Es posible que los productos que se enumeran ms arriba no constituyan una lista completa de los alimentos y las bebidas que debe evitar. Consulte a un nutricionista para obtener ms informacin. Preguntas para hacerle al mdico  Es necesario que me rena con un instructor en el cuidado de la diabetes?  Es necesario que me rena con un nutricionista?  A qu nmero puedo llamar si tengo preguntas?  Cules son los mejores momentos para controlar la glucemia? Dnde encontrar ms informacin:  Asociacin Estadounidense de la Diabetes (American Diabetes Association): diabetes.org  Academy of Nutrition and Dietetics (Academia de Nutricin y Diettica): www.eatright.org  National Institute of Diabetes and Digestive and Kidney Diseases (Instituto Nacional de la Diabetes y las Enfermedades Digestivas y Renales): www.niddk.nih.gov  Association of Diabetes Care and Education Specialists (Asociacin de Especialistas en Atencin y Educacin sobre la Diabetes): www.diabeteseducator.org Resumen  Es importante tener hbitos alimenticios saludables debido a que sus niveles de azcar en la sangre (glucosa) se ven afectados en  gran medida por lo que come y bebe.  Un plan de alimentacin saludable lo ayudar a controlar la glucemia y mantener un estilo de vida saludable.  El mdico puede recomendarle que trabaje con un nutricionista para elaborar el mejor plan para usted.  Tenga en cuenta que los carbohidratos (hidratos de carbono) y el alcohol tienen efectos inmediatos en sus niveles de glucemia. Es importante contar los carbohidratos que ingiere y consumir alcohol con prudencia. Esta informacin no tiene como fin reemplazar el consejo del mdico. Asegrese de hacerle al mdico cualquier pregunta que tenga. Document Revised: 09/02/2019 Document Reviewed: 09/02/2019 Elsevier Patient Education  2021 [employer].  
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
## 2020-11-02 NOTE — Telephone Encounter (Signed)
Called [facility] pharmacy to cancel Bydureon BCise, Lisinopril, Metformin and Simvastatin. Patient wanted Rx sent to [facility] pharmacy. Spoke to [employer] Scientist, research (physical sciences)).

## 2021-01-23 LAB — HM DIABETES EYE EXAM

## 2021-04-19 ENCOUNTER — Other Ambulatory Visit: Payer: Self-pay | Admitting: Emergency Medicine

## 2021-04-19 DIAGNOSIS — I152 Hypertension secondary to endocrine disorders: Secondary | ICD-10-CM

## 2021-06-26 ENCOUNTER — Encounter: Payer: Self-pay | Admitting: Emergency Medicine

## 2021-06-26 ENCOUNTER — Other Ambulatory Visit: Payer: Self-pay

## 2021-06-26 ENCOUNTER — Ambulatory Visit (INDEPENDENT_AMBULATORY_CARE_PROVIDER_SITE_OTHER): Payer: Commercial Managed Care - PPO | Admitting: Emergency Medicine

## 2021-06-26 VITALS — BP 138/78 | HR 95 | Ht 73.0 in | Wt 316.0 lb

## 2021-06-26 DIAGNOSIS — G4733 Obstructive sleep apnea (adult) (pediatric): Secondary | ICD-10-CM

## 2021-06-26 DIAGNOSIS — Z1211 Encounter for screening for malignant neoplasm of colon: Secondary | ICD-10-CM | POA: Diagnosis not present

## 2021-06-26 DIAGNOSIS — I152 Hypertension secondary to endocrine disorders: Secondary | ICD-10-CM | POA: Diagnosis not present

## 2021-06-26 DIAGNOSIS — Z23 Encounter for immunization: Secondary | ICD-10-CM | POA: Diagnosis not present

## 2021-06-26 DIAGNOSIS — E1169 Type 2 diabetes mellitus with other specified complication: Secondary | ICD-10-CM

## 2021-06-26 DIAGNOSIS — E1159 Type 2 diabetes mellitus with other circulatory complications: Secondary | ICD-10-CM | POA: Diagnosis not present

## 2021-06-26 DIAGNOSIS — E785 Hyperlipidemia, unspecified: Secondary | ICD-10-CM

## 2021-06-26 DIAGNOSIS — Z9989 Dependence on other enabling machines and devices: Secondary | ICD-10-CM

## 2021-06-26 LAB — LIPID PANEL
Cholesterol: 119 mg/dL (ref 0–200)
HDL: 34.7 mg/dL — ABNORMAL LOW (ref >39.00)
LDL Cholesterol: 63 mg/dL (ref 0–99)
NonHDL: 84.15
Total CHOL/HDL Ratio: 3
Triglycerides: 108 mg/dL (ref 0.0–149.0)
VLDL: 21.6 mg/dL (ref 0.0–40.0)

## 2021-06-26 LAB — COMPREHENSIVE METABOLIC PANEL
ALT: 24 U/L (ref 0–53)
AST: 15 U/L (ref 0–37)
Albumin: 4.3 g/dL (ref 3.5–5.2)
Alkaline Phosphatase: 76 U/L (ref 39–117)
BUN: 11 mg/dL (ref 6–23)
CO2: 29 mEq/L (ref 19–32)
Calcium: 9.3 mg/dL (ref 8.4–10.5)
Chloride: 104 mEq/L (ref 96–112)
Creatinine, Ser: 0.76 mg/dL (ref 0.40–1.50)
GFR: 96.77 mL/min (ref >60.00)
Glucose, Bld: 102 mg/dL — ABNORMAL HIGH (ref 70–99)
Potassium: 4.2 mEq/L (ref 3.5–5.1)
Sodium: 140 mEq/L (ref 135–145)
Total Bilirubin: 0.3 mg/dL (ref 0.2–1.2)
Total Protein: 7.5 g/dL (ref 6.0–8.3)

## 2021-06-26 LAB — POCT GLYCOSYLATED HEMOGLOBIN (HGB A1C): Hemoglobin A1C: 6.5 % — AB (ref 4.0–5.6)

## 2021-06-26 NOTE — Assessment & Plan Note (Signed)
Well-controlled hypertension.  Continue lisinopril 20 mg daily. Well-controlled diabetes with hemoglobin A1c of 6.5.  Continue metformin 1000 mg twice a day on Bydureon 2 milligrams weekly. Diet and nutrition discussed. Follow-up in 3 months.
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
## 2021-06-26 NOTE — Patient Instructions (Signed)
Diabetes mellitus y nutricin, en adultos Diabetes Mellitus and Nutrition, Adult Si sufre de diabetes, o diabetes mellitus, es muy importante tener hbitos alimenticios saludables debido a que sus niveles de azcar en la sangre (glucosa) se ven afectados en gran medida por lo que come y bebe. Com[place] alimentos saludables en las cantidades correctas, aproximadamente a la misma hora todos los das, lo ayudar a: Controlar su glucemia. Disminuir el riesgo de sufrir una enfermedad cardaca. Mejorar la presin arterial. Alcanzar o mantener un peso saludable. Qu puede afectar mi plan de alimentacin? Todas las personas que sufren de diabetes son diferentes y cada una tiene necesidades diferentes en cuanto a un plan de alimentacin. El mdico puede recomendarle que trabaje con un nutricionista para elaborar el mejor plan para usted. Su plan de alimentacin puede variar segn factores como: Las caloras que necesita. Los medicamentos que toma. Su peso. Sus niveles de glucemia, presin arterial y colesterol. Su nivel de actividad. Otras afecciones que tenga, como enfermedades cardacas o renales. Cmo me afectan los carbohidratos? Los carbohidratos, o hidratos de carbono, afectan su nivel de glucemia ms que cualquier otro tipo de alimento. La ingesta de carbohidratos aumenta la cantidad de glucosa en la sangre. Es importante conocer la cantidad de carbohidratos que se pueden ingerir en cada comida sin correr ningn riesgo. Esto es diferente en cada persona. Su nutricionista puede ayudarlo a calcular la cantidad de carbohidratos que debe ingerir en cada comida y en cada refrigerio. Cmo me afecta el alcohol? El alcohol puede provocar una disminucin de la glucemia (hipoglucemia), especialmente si usa insulina o toma determinados medicamentos por va oral para la diabetes. La hipoglucemia es una afeccin potencialmente mortal. Los sntomas de la hipoglucemia, como somnolencia, mareos y confusin, son  similares a los sntomas de haber consumido demasiado alcohol. No beba alcohol si: Su mdico le indica no hacerlo. Est embarazada, puede estar embarazada o est tratando de quedar embarazada. Si bebe alcohol: Limite la cantidad que bebe a lo siguiente: De 0 a 1 medida por da para las mujeres. De 0 a 2 medidas por da para los hombres. Sepa cunta cantidad de alcohol hay en las bebidas que toma. En los [place], una medida equivale a una botella de cerveza de 12 oz (355 ml), un vaso de vino de 5 oz (148 ml) o un vaso de una bebida alcohlica de alta graduacin de 1 oz (44 ml). Mantngase hidratado bebiendo agua, refrescos dietticos o t helado sin azcar. Tenga en cuenta que los refrescos comunes, los jugos y otras bebidas para mezclar pueden contener mucha azcar y se deben contar como carbohidratos. Consejos para seguir este plan Leer las etiquetas de los alimentos Comience por leer el tamao de la porcin en la etiqueta de Informacin nutricional de los alimentos envasados y las bebidas. La cantidad de caloras, carbohidratos, grasas y otros nutrientes detallados en la etiqueta se basan en una porcin del alimento. Muchos alimentos contienen ms de una porcin por envase. Verifique la cantidad total de gramos (g) de carbohidratos totales en una porcin. Verifique la cantidad de gramos de grasas saturadas y grasas trans en una porcin. Escoja alimentos que no contengan estas grasas o que su contenido de estas sea bajo. Verifique la cantidad de miligramos (mg) de sal (sodio) en una porcin. La mayora de las personas deben limitar la ingesta de sodio total a menos de 2300 mg por da. Siempre consulte la informacin nutricional de los alimentos etiquetados como "con bajo contenido de grasa" o "sin grasa". Estos   alimentos pueden tener un mayor contenido de azcar agregada o carbohidratos refinados, y deben evitarse. Hable con su nutricionista para identificar sus objetivos diarios en cuanto  a los nutrientes mencionados en la etiqueta. Al ir de compras Evite comprar alimentos procesados, enlatados o precocidos. Estos alimentos tienden a tener una mayor cantidad de grasa, sodio y azcar agregada. Compre en la zona exterior de la tienda de comestibles. Esta es la zona donde se encuentran con mayor frecuencia las frutas y las verduras frescas, los cereales a granel, las carnes frescas y los productos lcteos frescos. Al cocinar Use mtodos de coccin a baja temperatura, como hornear, en lugar de mtodos de coccin a alta temperatura, como frer en abundante aceite. Cocine con aceites saludables, como el aceite de oliva, [place] o girasol. Evite cocinar con manteca, crema o carnes con alto contenido de grasa. Planificacin de las comidas Coma las comidas y los refrigerios regularmente, preferentemente a la misma hora todos los das. Evite pasar largos perodos de tiempo sin comer. Consuma alimentos ricos en fibra, como frutas frescas, verduras, frijoles y cereales integrales. Consuma entre 4 y 6 onzas (entre 112 y 168 g) de protenas magras por da, como carnes magras, pollo, pescado, huevos o tofu. Una onza (oz) (28 g) de protena magra equivale a: 1 onza (28 g) de carne, pollo o pescado. 1 huevo.  taza (62 g) de tofu. Coma algunos alimentos por da que contengan grasas saludables, como aguacates, frutos secos, semillas y pescado. Qu alimentos debo comer? Frutas Bayas. Manzanas. Naranjas. Duraznos. Damascos. Ciruelas. Uvas. Mangos. Papayas. Granadas. Kiwi. Cerezas. Verduras Verduras de hoja verde, que incluyen lechuga, espinaca, col rizada, acelga, hojas de berza, hojas de mostaza y repollo. Remolachas. Coliflor. Brcoli. Zanahorias. Judas verdes. Tomates. Pimientos. Cebollas. Pepinos. Coles de Bruselas. Granos Granos integrales, como panes, galletas, tortillas, cereales y pastas de salvado o integrales. Avena sin azcar. Quinua. Arroz integral o salvaje. Carnes y otras  protenas Frutos de mar. Carne de ave sin piel. Cortes magros de ave y carne de res. Tofu. Frutos secos. Semillas. Lcteos Productos lcteos sin grasa o con bajo contenido de grasa, como leche, yogur y queso. Es posible que los productos detallados arriba no constituyan una lista completa de los alimentos y las bebidas que puede tomar. Consulte a un nutricionista para obtener ms informacin. Qu alimentos debo evitar? Frutas Frutas enlatadas al almbar. Verduras Verduras enlatadas. Verduras congeladas con mantequilla o salsa de crema. Granos Productos elaborados con harina y harina blanca refinada, como panes, pastas, bocadillos y cereales. Evite todos los alimentos procesados. Carnes y otras protenas Cortes de carne con alto contenido de grasa. Carne de ave con piel. Carnes empanizadas o fritas. Carne procesada. Evite las grasas saturadas. Lcteos Yogur, queso o leche enteros. Bebidas Bebidas azucaradas, como gaseosas o t helado. Es posible que los productos que se enumeran ms arriba no constituyan una lista completa de los alimentos y las bebidas que debe evitar. Consulte a un nutricionista para obtener ms informacin. Preguntas para hacerle al mdico Debo consultar con un especialista certificado en atencin y educacin sobre la diabetes? Es necesario que me rena con un nutricionista? A qu nmero puedo llamar si tengo preguntas? Cules son los mejores momentos para controlar la glucemia? Dnde encontrar ms informacin: American Diabetes Association (Asociacin Estadounidense de la Diabetes): diabetes.org Academy of Nutrition and Dietetics (Academia de Nutricin y Diettica): eatright.org National Institute of Diabetes and Digestive and Kidney Diseases (Instituto Nacional de la Diabetes y las Enfermedades Digestivas y Renales): niddk.nih.gov Association of Diabetes Care &   Education Specialists (Asociacin de Especialistas en Atencin y Educacin sobre la Diabetes):  diabeteseducator.org Resumen Es importante tener hbitos alimenticios saludables debido a que sus niveles de azcar en la sangre (glucosa) se ven afectados en gran medida por lo que come y bebe. Es importante consumir alcohol con prudencia. Un plan de comidas saludable lo ayudar a controlar la glucosa en sangre y a reducir el riesgo de enfermedades cardacas. El mdico puede recomendarle que trabaje con un nutricionista para elaborar el mejor plan para usted. Esta informacin no tiene como fin reemplazar el consejo del mdico. Asegrese de hacerle al mdico cualquier pregunta que tenga. Document Revised: 04/05/2020 Document Reviewed: 04/05/2020 Elsevier Patient Education  2022 Elsevier Inc.  

## 2021-06-26 NOTE — Assessment & Plan Note (Signed)
Diet and nutrition discussed.  Continue simvastatin 40 mg daily. Lipid profile done today. Follow-up in 3 weeks

## 2021-07-16 ENCOUNTER — Encounter: Payer: Self-pay | Admitting: Emergency Medicine

## 2021-09-27 ENCOUNTER — Ambulatory Visit (INDEPENDENT_AMBULATORY_CARE_PROVIDER_SITE_OTHER): Payer: BC Managed Care – PPO | Admitting: Emergency Medicine

## 2021-09-27 ENCOUNTER — Encounter: Payer: Self-pay | Admitting: Emergency Medicine

## 2021-09-27 ENCOUNTER — Other Ambulatory Visit: Payer: Self-pay

## 2021-09-27 VITALS — BP 134/80 | HR 90 | Temp 98.6°F | Ht 73.0 in | Wt 318.0 lb

## 2021-09-27 DIAGNOSIS — I152 Hypertension secondary to endocrine disorders: Secondary | ICD-10-CM

## 2021-09-27 DIAGNOSIS — Z1211 Encounter for screening for malignant neoplasm of colon: Secondary | ICD-10-CM | POA: Diagnosis not present

## 2021-09-27 DIAGNOSIS — E1159 Type 2 diabetes mellitus with other circulatory complications: Secondary | ICD-10-CM | POA: Diagnosis not present

## 2021-09-27 DIAGNOSIS — E1169 Type 2 diabetes mellitus with other specified complication: Secondary | ICD-10-CM | POA: Diagnosis not present

## 2021-09-27 DIAGNOSIS — E785 Hyperlipidemia, unspecified: Secondary | ICD-10-CM

## 2021-09-27 DIAGNOSIS — G4733 Obstructive sleep apnea (adult) (pediatric): Secondary | ICD-10-CM

## 2021-09-27 DIAGNOSIS — Z9989 Dependence on other enabling machines and devices: Secondary | ICD-10-CM

## 2021-09-27 LAB — POCT GLYCOSYLATED HEMOGLOBIN (HGB A1C): Hemoglobin A1C: 6.4 % — AB (ref 4.0–5.6)
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
## 2021-09-27 NOTE — Assessment & Plan Note (Signed)
Stable.  Diet and nutrition discussed. Continue simvastatin 40 mg daily.  

## 2021-09-27 NOTE — Patient Instructions (Signed)
Diabetes mellitus y nutricin, en adultos Diabetes Mellitus and Nutrition, Adult Si sufre de diabetes, o diabetes mellitus, es muy importante tener hbitos alimenticios saludables debido a que sus niveles de azcar en la sangre (glucosa) se ven afectados en gran medida por lo que come y bebe. Com[place] alimentos saludables en las cantidades correctas, aproximadamente a la misma hora todos los das, lo ayudar a: Controlar su glucemia. Disminuir el riesgo de sufrir una enfermedad cardaca. Mejorar la presin arterial. Alcanzar o mantener un peso saludable. Qu puede afectar mi plan de alimentacin? Todas las personas que sufren de diabetes son diferentes y cada una tiene necesidades diferentes en cuanto a un plan de alimentacin. El mdico puede recomendarle que trabaje con un nutricionista para elaborar el mejor plan para usted. Su plan de alimentacin puede variar segn factores como: Las caloras que necesita. Los medicamentos que toma. Su peso. Sus niveles de glucemia, presin arterial y colesterol. Su nivel de actividad. Otras afecciones que tenga, como enfermedades cardacas o renales. Cmo me afectan los carbohidratos? Los carbohidratos, o hidratos de carbono, afectan su nivel de glucemia ms que cualquier otro tipo de alimento. La ingesta de carbohidratos aumenta la cantidad de glucosa en la sangre. Es importante conocer la cantidad de carbohidratos que se pueden ingerir en cada comida sin correr ningn riesgo. Esto es diferente en cada persona. Su nutricionista puede ayudarlo a calcular la cantidad de carbohidratos que debe ingerir en cada comida y en cada refrigerio. Cmo me afecta el alcohol? El alcohol puede provocar una disminucin de la glucemia (hipoglucemia), especialmente si usa insulina o toma determinados medicamentos por va oral para la diabetes. La hipoglucemia es una afeccin potencialmente mortal. Los sntomas de la hipoglucemia, como somnolencia, mareos y confusin, son  similares a los sntomas de haber consumido demasiado alcohol. No beba alcohol si: Su mdico le indica no hacerlo. Est embarazada, puede estar embarazada o est tratando de quedar embarazada. Si bebe alcohol: Limite la cantidad que bebe a lo siguiente: De 0 a 1 medida por da para las mujeres. De 0 a 2 medidas por da para los hombres. Sepa cunta cantidad de alcohol hay en las bebidas que toma. En los [place], una medida equivale a una botella de cerveza de 12 oz (355 ml), un vaso de vino de 5 oz (148 ml) o un vaso de una bebida alcohlica de alta graduacin de 1 oz (44 ml). Mantngase hidratado bebiendo agua, refrescos dietticos o t helado sin azcar. Tenga en cuenta que los refrescos comunes, los jugos y otras bebidas para mezclar pueden contener mucha azcar y se deben contar como carbohidratos. Consejos para seguir este plan Leer las etiquetas de los alimentos Comience por leer el tamao de la porcin en la etiqueta de Informacin nutricional de los alimentos envasados y las bebidas. La cantidad de caloras, carbohidratos, grasas y otros nutrientes detallados en la etiqueta se basan en una porcin del alimento. Muchos alimentos contienen ms de una porcin por envase. Verifique la cantidad total de gramos (g) de carbohidratos totales en una porcin. Verifique la cantidad de gramos de grasas saturadas y grasas trans en una porcin. Escoja alimentos que no contengan estas grasas o que su contenido de estas sea bajo. Verifique la cantidad de miligramos (mg) de sal (sodio) en una porcin. La mayora de las personas deben limitar la ingesta de sodio total a menos de 2300 mg por da. Siempre consulte la informacin nutricional de los alimentos etiquetados como "con bajo contenido de grasa" o "sin grasa". Estos   alimentos pueden tener un mayor contenido de azcar agregada o carbohidratos refinados, y deben evitarse. Hable con su nutricionista para identificar sus objetivos diarios en cuanto  a los nutrientes mencionados en la etiqueta. Al ir de compras Evite comprar alimentos procesados, enlatados o precocidos. Estos alimentos tienden a tener una mayor cantidad de grasa, sodio y azcar agregada. Compre en la zona exterior de la tienda de comestibles. Esta es la zona donde se encuentran con mayor frecuencia las frutas y las verduras frescas, los cereales a granel, las carnes frescas y los productos lcteos frescos. Al cocinar Use mtodos de coccin a baja temperatura, como hornear, en lugar de mtodos de coccin a alta temperatura, como frer en abundante aceite. Cocine con aceites saludables, como el aceite de oliva, [place] o girasol. Evite cocinar con manteca, crema o carnes con alto contenido de grasa. Planificacin de las comidas Coma las comidas y los refrigerios regularmente, preferentemente a la misma hora todos los das. Evite pasar largos perodos de tiempo sin comer. Consuma alimentos ricos en fibra, como frutas frescas, verduras, frijoles y cereales integrales. Consuma entre 4 y 6 onzas (entre 112 y 168 g) de protenas magras por da, como carnes magras, pollo, pescado, huevos o tofu. Una onza (oz) (28 g) de protena magra equivale a: 1 onza (28 g) de carne, pollo o pescado. 1 huevo.  taza (62 g) de tofu. Coma algunos alimentos por da que contengan grasas saludables, como aguacates, frutos secos, semillas y pescado. Qu alimentos debo comer? Frutas Bayas. Manzanas. Naranjas. Duraznos. Damascos. Ciruelas. Uvas. Mangos. Papayas. Granadas. Kiwi. Cerezas. Verduras Verduras de hoja verde, que incluyen lechuga, espinaca, col rizada, acelga, hojas de berza, hojas de mostaza y repollo. Remolachas. Coliflor. Brcoli. Zanahorias. Judas verdes. Tomates. Pimientos. Cebollas. Pepinos. Coles de Bruselas. Granos Granos integrales, como panes, galletas, tortillas, cereales y pastas de salvado o integrales. Avena sin azcar. Quinua. Arroz integral o salvaje. Carnes y otras  protenas Frutos de mar. Carne de ave sin piel. Cortes magros de ave y carne de res. Tofu. Frutos secos. Semillas. Lcteos Productos lcteos sin grasa o con bajo contenido de grasa, como leche, yogur y queso. Es posible que los productos detallados arriba no constituyan una lista completa de los alimentos y las bebidas que puede tomar. Consulte a un nutricionista para obtener ms informacin. Qu alimentos debo evitar? Frutas Frutas enlatadas al almbar. Verduras Verduras enlatadas. Verduras congeladas con mantequilla o salsa de crema. Granos Productos elaborados con harina y harina blanca refinada, como panes, pastas, bocadillos y cereales. Evite todos los alimentos procesados. Carnes y otras protenas Cortes de carne con alto contenido de grasa. Carne de ave con piel. Carnes empanizadas o fritas. Carne procesada. Evite las grasas saturadas. Lcteos Yogur, queso o leche enteros. Bebidas Bebidas azucaradas, como gaseosas o t helado. Es posible que los productos que se enumeran ms arriba no constituyan una lista completa de los alimentos y las bebidas que debe evitar. Consulte a un nutricionista para obtener ms informacin. Preguntas para hacerle al mdico Debo consultar con un especialista certificado en atencin y educacin sobre la diabetes? Es necesario que me rena con un nutricionista? A qu nmero puedo llamar si tengo preguntas? Cules son los mejores momentos para controlar la glucemia? Dnde encontrar ms informacin: American Diabetes Association (Asociacin Estadounidense de la Diabetes): diabetes.org Academy of Nutrition and Dietetics (Academia de Nutricin y Diettica): eatright.org National Institute of Diabetes and Digestive and Kidney Diseases (Instituto Nacional de la Diabetes y las Enfermedades Digestivas y Renales): niddk.nih.gov Association of Diabetes Care &   Education Specialists (Asociacin de Especialistas en Atencin y Educacin sobre la Diabetes):  diabeteseducator.org Resumen Es importante tener hbitos alimenticios saludables debido a que sus niveles de azcar en la sangre (glucosa) se ven afectados en gran medida por lo que come y bebe. Es importante consumir alcohol con prudencia. Un plan de comidas saludable lo ayudar a controlar la glucosa en sangre y a reducir el riesgo de enfermedades cardacas. El mdico puede recomendarle que trabaje con un nutricionista para elaborar el mejor plan para usted. Esta informacin no tiene como fin reemplazar el consejo del mdico. Asegrese de hacerle al mdico cualquier pregunta que tenga. Document Revised: 04/05/2020 Document Reviewed: 04/05/2020 Elsevier Patient Education  2022 Elsevier Inc.  

## 2021-09-27 NOTE — Assessment & Plan Note (Signed)
Stable

## 2021-09-27 NOTE — Assessment & Plan Note (Signed)
Well-controlled hypertension.  Continue lisinopril 20 mg daily. Well-controlled diabetes with hemoglobin A1c of 6.4 today. Continue weekly Bydureon 2 mg and metformin 1000 mg twice a day Diet and nutrition discussed. Follow-up in 6 months.

## 2021-10-19 ENCOUNTER — Other Ambulatory Visit: Payer: Self-pay | Admitting: Emergency Medicine

## 2021-10-19 DIAGNOSIS — I10 Essential (primary) hypertension: Secondary | ICD-10-CM

## 2021-10-22 ENCOUNTER — Encounter (HOSPITAL_COMMUNITY): Payer: Self-pay

## 2021-10-22 ENCOUNTER — Other Ambulatory Visit: Payer: Self-pay

## 2021-10-22 ENCOUNTER — Emergency Department (HOSPITAL_COMMUNITY)
Admission: EM | Admit: 2021-10-22 | Discharge: 2021-10-22 | Disposition: A | Discharge status: Home / Self Care | Payer: BC Managed Care – PPO | Attending: Emergency Medicine | Admitting: Emergency Medicine

## 2021-10-22 ENCOUNTER — Emergency Department (HOSPITAL_COMMUNITY): Payer: BC Managed Care – PPO

## 2021-10-22 DIAGNOSIS — R109 Unspecified abdominal pain: Secondary | ICD-10-CM | POA: Diagnosis not present

## 2021-10-22 DIAGNOSIS — D72829 Elevated white blood cell count, unspecified: Secondary | ICD-10-CM | POA: Insufficient documentation

## 2021-10-22 DIAGNOSIS — S2241XA Multiple fractures of ribs, right side, initial encounter for closed fracture: Secondary | ICD-10-CM | POA: Diagnosis not present

## 2021-10-22 DIAGNOSIS — Z7982 Long term (current) use of aspirin: Secondary | ICD-10-CM | POA: Diagnosis not present

## 2021-10-22 DIAGNOSIS — S299XXA Unspecified injury of thorax, initial encounter: Secondary | ICD-10-CM | POA: Diagnosis not present

## 2021-10-22 DIAGNOSIS — R531 Weakness: Secondary | ICD-10-CM | POA: Diagnosis not present

## 2021-10-22 DIAGNOSIS — S52571A Other intraarticular fracture of lower end of right radius, initial encounter for closed fracture: Secondary | ICD-10-CM | POA: Insufficient documentation

## 2021-10-22 DIAGNOSIS — S2220XA Unspecified fracture of sternum, initial encounter for closed fracture: Secondary | ICD-10-CM | POA: Diagnosis not present

## 2021-10-22 DIAGNOSIS — Z79899 Other long term (current) drug therapy: Secondary | ICD-10-CM | POA: Insufficient documentation

## 2021-10-22 DIAGNOSIS — S3991XA Unspecified injury of abdomen, initial encounter: Secondary | ICD-10-CM | POA: Insufficient documentation

## 2021-10-22 DIAGNOSIS — S2231XA Fracture of one rib, right side, initial encounter for closed fracture: Secondary | ICD-10-CM | POA: Insufficient documentation

## 2021-10-22 DIAGNOSIS — S8992XA Unspecified injury of left lower leg, initial encounter: Secondary | ICD-10-CM | POA: Diagnosis not present

## 2021-10-22 DIAGNOSIS — E119 Type 2 diabetes mellitus without complications: Secondary | ICD-10-CM | POA: Diagnosis not present

## 2021-10-22 DIAGNOSIS — M25521 Pain in right elbow: Secondary | ICD-10-CM | POA: Diagnosis not present

## 2021-10-22 DIAGNOSIS — S199XXA Unspecified injury of neck, initial encounter: Secondary | ICD-10-CM | POA: Diagnosis not present

## 2021-10-22 DIAGNOSIS — I10 Essential (primary) hypertension: Secondary | ICD-10-CM | POA: Insufficient documentation

## 2021-10-22 DIAGNOSIS — S52511A Displaced fracture of right radial styloid process, initial encounter for closed fracture: Secondary | ICD-10-CM | POA: Diagnosis not present

## 2021-10-22 DIAGNOSIS — M25562 Pain in left knee: Secondary | ICD-10-CM | POA: Diagnosis not present

## 2021-10-22 DIAGNOSIS — S52501A Unspecified fracture of the lower end of right radius, initial encounter for closed fracture: Secondary | ICD-10-CM | POA: Diagnosis not present

## 2021-10-22 DIAGNOSIS — S0990XA Unspecified injury of head, initial encounter: Secondary | ICD-10-CM | POA: Diagnosis not present

## 2021-10-22 DIAGNOSIS — E669 Obesity, unspecified: Secondary | ICD-10-CM | POA: Diagnosis not present

## 2021-10-22 DIAGNOSIS — Z6841 Body Mass Index (BMI) 40.0 and over, adult: Secondary | ICD-10-CM | POA: Diagnosis not present

## 2021-10-22 DIAGNOSIS — R000 Tachycardia, unspecified: Secondary | ICD-10-CM | POA: Insufficient documentation

## 2021-10-22 DIAGNOSIS — Z7984 Long term (current) use of oral hypoglycemic drugs: Secondary | ICD-10-CM | POA: Insufficient documentation

## 2021-10-22 DIAGNOSIS — Y92410 Unspecified street and highway as the place of occurrence of the external cause: Secondary | ICD-10-CM | POA: Insufficient documentation

## 2021-10-22 LAB — I-STAT CHEM 8, ED
BUN: 7 mg/dL — ABNORMAL LOW (ref 8–23)
Calcium, Ion: 1.16 mmol/L (ref 1.15–1.40)
Chloride: 102 mmol/L (ref 98–111)
Creatinine, Ser: 0.7 mg/dL (ref 0.61–1.24)
Glucose, Bld: 124 mg/dL — ABNORMAL HIGH (ref 70–99)
HCT: 53 % — ABNORMAL HIGH (ref 39.0–52.0)
Hemoglobin: 18 g/dL — ABNORMAL HIGH (ref 13.0–17.0)
Potassium: 4 mmol/L (ref 3.5–5.1)
Sodium: 140 mmol/L (ref 135–145)
TCO2: 28 mmol/L (ref 22–32)

## 2021-10-22 LAB — CBC WITH DIFFERENTIAL/PLATELET
Abs Immature Granulocytes: 0.11 10*3/uL — ABNORMAL HIGH (ref 0.00–0.07)
Basophils Absolute: 0.1 10*3/uL (ref 0.0–0.1)
Basophils Relative: 0 %
Eosinophils Absolute: 0.1 10*3/uL (ref 0.0–0.5)
Eosinophils Relative: 1 %
HCT: 51.2 % (ref 39.0–52.0)
Hemoglobin: 16.4 g/dL (ref 13.0–17.0)
Immature Granulocytes: 1 %
Lymphocytes Relative: 9 %
Lymphs Abs: 1.7 10*3/uL (ref 0.7–4.0)
MCH: 26.9 pg (ref 26.0–34.0)
MCHC: 32 g/dL (ref 30.0–36.0)
MCV: 84.1 fL (ref 80.0–100.0)
Monocytes Absolute: 1 10*3/uL (ref 0.1–1.0)
Monocytes Relative: 6 %
Neutro Abs: 15.2 10*3/uL — ABNORMAL HIGH (ref 1.7–7.7)
Neutrophils Relative %: 83 %
Platelets: 298 10*3/uL (ref 150–400)
RBC: 6.09 MIL/uL — ABNORMAL HIGH (ref 4.22–5.81)
RDW: 14.3 % (ref 11.5–15.5)
WBC: 18.1 10*3/uL — ABNORMAL HIGH (ref 4.0–10.5)
nRBC: 0 % (ref 0.0–0.2)

## 2021-10-22 LAB — TROPONIN I (HIGH SENSITIVITY): Troponin I (High Sensitivity): 3 ng/L (ref <18)

## 2021-10-22 MED ORDER — ONDANSETRON 8 MG PO TBDP
8.0000 mg | ORAL_TABLET | Freq: Once | ORAL | Status: AC
  Administered 2021-10-22: 8 mg via ORAL
  Filled 2021-10-22: qty 1

## 2021-10-22 MED ORDER — OXYCODONE-ACETAMINOPHEN 5-325 MG PO TABS
1.0000 | ORAL_TABLET | Freq: Once | ORAL | Status: AC
  Administered 2021-10-22: 1 via ORAL
  Filled 2021-10-22: qty 1

## 2021-10-22 MED ORDER — METHOCARBAMOL 1000 MG/10ML IJ SOLN
1000.0000 mg | Freq: Once | INTRAMUSCULAR | Status: DC
  Filled 2021-10-22: qty 10

## 2021-10-22 MED ORDER — IOHEXOL 300 MG/ML  SOLN
100.0000 mL | Freq: Once | INTRAMUSCULAR | Status: AC | PRN
  Administered 2021-10-22: 100 mL via INTRAVENOUS

## 2021-10-22 MED ORDER — SODIUM CHLORIDE (PF) 0.9 % IJ SOLN
INTRAMUSCULAR | Status: AC
  Filled 2021-10-22: qty 50

## 2021-10-22 MED ORDER — OXYCODONE-ACETAMINOPHEN 5-325 MG PO TABS
1.0000 | ORAL_TABLET | Freq: Four times a day (QID) | ORAL | 0 refills | Status: DC | PRN
Start: 2021-10-22 — End: 2022-06-24

## 2021-10-22 MED ORDER — METHOCARBAMOL 1000 MG/10ML IJ SOLN
1000.0000 mg | Freq: Once | INTRAVENOUS | Status: AC
  Administered 2021-10-22: 1000 mg via INTRAVENOUS
  Filled 2021-10-22: qty 1000

## 2021-10-22 MED ORDER — HYDROMORPHONE HCL 1 MG/ML IJ SOLN
1.0000 mg | Freq: Once | INTRAMUSCULAR | Status: AC
  Administered 2021-10-22: 1 mg via INTRAVENOUS
  Filled 2021-10-22: qty 1

## 2021-10-22 NOTE — Progress Notes (Signed)
Orthopedic Tech Progress Note ?Patient Details:  ?Brian Reilly ?11/20/1960 ?[id number] ? ?Ortho Devices ?Type of Ortho Device: Ace wrap, Sugartong splint ?Ortho Device/Splint Location: right ?Ortho Device/Splint Interventions: Application ?  ?Post Interventions ?Patient Tolerated: Well ?Instructions Provided: Care of device ? ?Saul Fordyce ?10/22/2021, 8:38 AM ? ?

## 2021-10-22 NOTE — ED Notes (Signed)
Pt. Ambulate with no assist. Nurse aware.  ?
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
## 2021-10-22 NOTE — ED Provider Notes (Incomplete)
°  Physical Exam  BP (!) 132/92 (BP Location: Left Arm)    Pulse (!) 104    Temp 98.5 F (36.9 C) (Oral)    Resp 13    SpO2 93%   Physical Exam  Procedures  Procedures  ED Course / MDM   Clinical Course as of 10/22/21 1503  Mon Oct 22, 2021  1239 Patient's family member in room. Now stating that there was possible LOC, nausea, vomiting. R elbow pain. L knee pain.  [LM]    Clinical Course User Index [LM] Pamala Duffel   Medical Decision Making Amount and/or Complexity of Data Reviewed Labs: ordered. Radiology: ordered.  Risk Prescription drug management.   ***
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
## 2021-10-22 NOTE — ED Notes (Signed)
Ortho called for sugar tongue splint.  ?

## 2021-10-24 DIAGNOSIS — S52501A Unspecified fracture of the lower end of right radius, initial encounter for closed fracture: Secondary | ICD-10-CM | POA: Diagnosis not present

## 2021-10-24 DIAGNOSIS — S2231XA Fracture of one rib, right side, initial encounter for closed fracture: Secondary | ICD-10-CM | POA: Diagnosis not present

## 2021-10-29 DIAGNOSIS — S52501A Unspecified fracture of the lower end of right radius, initial encounter for closed fracture: Secondary | ICD-10-CM | POA: Diagnosis not present
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
## 2021-10-30 ENCOUNTER — Encounter (HOSPITAL_BASED_OUTPATIENT_CLINIC_OR_DEPARTMENT_OTHER): Payer: Self-pay | Admitting: Orthopedic Surgery

## 2021-10-30 ENCOUNTER — Other Ambulatory Visit: Payer: Self-pay

## 2021-11-01 ENCOUNTER — Encounter (HOSPITAL_BASED_OUTPATIENT_CLINIC_OR_DEPARTMENT_OTHER)
Admission: RE | Admit: 2021-11-01 | Discharge: 2021-11-01 | Disposition: A | Discharge status: Home / Self Care | Payer: BC Managed Care – PPO | Source: Ambulatory Visit | Attending: Orthopedic Surgery | Admitting: Orthopedic Surgery

## 2021-11-01 DIAGNOSIS — Z01818 Encounter for other preprocedural examination: Secondary | ICD-10-CM | POA: Insufficient documentation

## 2021-11-01 DIAGNOSIS — S52511A Displaced fracture of right radial styloid process, initial encounter for closed fracture: Secondary | ICD-10-CM | POA: Diagnosis not present

## 2021-11-01 DIAGNOSIS — M654 Radial styloid tenosynovitis [de Quervain]: Secondary | ICD-10-CM | POA: Diagnosis not present

## 2021-11-01 DIAGNOSIS — K219 Gastro-esophageal reflux disease without esophagitis: Secondary | ICD-10-CM | POA: Diagnosis not present

## 2021-11-01 DIAGNOSIS — Z6841 Body Mass Index (BMI) 40.0 and over, adult: Secondary | ICD-10-CM | POA: Diagnosis not present

## 2021-11-01 DIAGNOSIS — J45909 Unspecified asthma, uncomplicated: Secondary | ICD-10-CM | POA: Diagnosis not present

## 2021-11-01 DIAGNOSIS — Z7984 Long term (current) use of oral hypoglycemic drugs: Secondary | ICD-10-CM | POA: Diagnosis not present

## 2021-11-01 DIAGNOSIS — G4733 Obstructive sleep apnea (adult) (pediatric): Secondary | ICD-10-CM | POA: Diagnosis not present

## 2021-11-01 DIAGNOSIS — Z79899 Other long term (current) drug therapy: Secondary | ICD-10-CM | POA: Diagnosis not present

## 2021-11-01 DIAGNOSIS — E119 Type 2 diabetes mellitus without complications: Secondary | ICD-10-CM | POA: Diagnosis not present

## 2021-11-01 LAB — BASIC METABOLIC PANEL
Anion gap: 7 (ref 5–15)
BUN: 10 mg/dL (ref 8–23)
CO2: 26 mmol/L (ref 22–32)
Calcium: 9.3 mg/dL (ref 8.9–10.3)
Chloride: 106 mmol/L (ref 98–111)
Creatinine, Ser: 0.83 mg/dL (ref 0.61–1.24)
GFR, Estimated: >60 mL/min (ref >60)
Glucose, Bld: 108 mg/dL — ABNORMAL HIGH (ref 70–99)
Potassium: 4.8 mmol/L (ref 3.5–5.1)
Sodium: 139 mmol/L (ref 135–145)

## 2021-11-01 NOTE — Progress Notes (Signed)

## 2021-11-02 ENCOUNTER — Ambulatory Visit (HOSPITAL_BASED_OUTPATIENT_CLINIC_OR_DEPARTMENT_OTHER): Payer: BC Managed Care – PPO

## 2021-11-02 ENCOUNTER — Ambulatory Visit (HOSPITAL_BASED_OUTPATIENT_CLINIC_OR_DEPARTMENT_OTHER): Payer: BC Managed Care – PPO | Admitting: Anesthesiology

## 2021-11-02 ENCOUNTER — Other Ambulatory Visit: Payer: Self-pay

## 2021-11-02 ENCOUNTER — Encounter (HOSPITAL_BASED_OUTPATIENT_CLINIC_OR_DEPARTMENT_OTHER): Payer: Self-pay | Admitting: Orthopedic Surgery

## 2021-11-02 ENCOUNTER — Encounter (HOSPITAL_BASED_OUTPATIENT_CLINIC_OR_DEPARTMENT_OTHER)
Admission: RE | Disposition: A | Discharge status: Home / Self Care | Payer: Self-pay | Source: Home / Self Care | Attending: Orthopedic Surgery

## 2021-11-02 ENCOUNTER — Ambulatory Visit (HOSPITAL_BASED_OUTPATIENT_CLINIC_OR_DEPARTMENT_OTHER)
Admission: RE | Admit: 2021-11-02 | Discharge: 2021-11-02 | Disposition: A | Discharge status: Home / Self Care | Payer: BC Managed Care – PPO | Attending: Orthopedic Surgery | Admitting: Orthopedic Surgery

## 2021-11-02 DIAGNOSIS — S52501A Unspecified fracture of the lower end of right radius, initial encounter for closed fracture: Secondary | ICD-10-CM | POA: Diagnosis not present

## 2021-11-02 DIAGNOSIS — K219 Gastro-esophageal reflux disease without esophagitis: Secondary | ICD-10-CM | POA: Diagnosis not present

## 2021-11-02 DIAGNOSIS — Z79899 Other long term (current) drug therapy: Secondary | ICD-10-CM | POA: Insufficient documentation

## 2021-11-02 DIAGNOSIS — E119 Type 2 diabetes mellitus without complications: Secondary | ICD-10-CM | POA: Insufficient documentation

## 2021-11-02 DIAGNOSIS — I10 Essential (primary) hypertension: Secondary | ICD-10-CM | POA: Diagnosis not present

## 2021-11-02 DIAGNOSIS — Z6841 Body Mass Index (BMI) 40.0 and over, adult: Secondary | ICD-10-CM | POA: Diagnosis not present

## 2021-11-02 DIAGNOSIS — Z7984 Long term (current) use of oral hypoglycemic drugs: Secondary | ICD-10-CM | POA: Insufficient documentation

## 2021-11-02 DIAGNOSIS — G4733 Obstructive sleep apnea (adult) (pediatric): Secondary | ICD-10-CM | POA: Diagnosis not present

## 2021-11-02 DIAGNOSIS — J45909 Unspecified asthma, uncomplicated: Secondary | ICD-10-CM | POA: Insufficient documentation

## 2021-11-02 DIAGNOSIS — S52511A Displaced fracture of right radial styloid process, initial encounter for closed fracture: Secondary | ICD-10-CM | POA: Diagnosis not present

## 2021-11-02 DIAGNOSIS — M654 Radial styloid tenosynovitis [de Quervain]: Secondary | ICD-10-CM | POA: Diagnosis not present

## 2021-11-02 DIAGNOSIS — I152 Hypertension secondary to endocrine disorders: Secondary | ICD-10-CM

## 2021-11-02 DIAGNOSIS — S52514A Nondisplaced fracture of right radial styloid process, initial encounter for closed fracture: Secondary | ICD-10-CM | POA: Diagnosis not present

## 2021-11-02 HISTORY — PX: DORSAL COMPARTMENT RELEASE: SHX5039

## 2021-11-02 HISTORY — DX: Essential (primary) hypertension: I10

## 2021-11-02 HISTORY — PX: ORIF WRIST FRACTURE: SHX2133

## 2021-11-02 LAB — GLUCOSE, CAPILLARY
Glucose-Capillary: 98 mg/dL (ref 70–99)
Glucose-Capillary: 88 mg/dL (ref 70–99)

## 2021-11-02 SURGERY — RELEASE, FIRST DORSAL COMPARTMENT, HAND
Anesthesia: Monitor Anesthesia Care | Site: Wrist | Laterality: Right

## 2021-11-02 MED ORDER — CEFAZOLIN IN SODIUM CHLORIDE 3-0.9 GM/100ML-% IV SOLN
3.0000 g | INTRAVENOUS | Status: AC
  Administered 2021-11-02: 3 g via INTRAVENOUS

## 2021-11-02 MED ORDER — EPHEDRINE 5 MG/ML INJ
INTRAVENOUS | Status: AC
  Filled 2021-11-02: qty 5

## 2021-11-02 MED ORDER — OXYCODONE HCL 5 MG PO TABS: 5.0000 mg | ORAL_TABLET | Freq: Four times a day (QID) | ORAL | 0 refills | Status: DC | PRN

## 2021-11-02 MED ORDER — FENTANYL CITRATE (PF) 100 MCG/2ML IJ SOLN
INTRAMUSCULAR | Status: AC
  Filled 2021-11-02: qty 2

## 2021-11-02 MED ORDER — ROPIVACAINE HCL 5 MG/ML IJ SOLN
INTRAMUSCULAR | Status: DC | PRN
  Administered 2021-11-02: 30 mL via PERINEURAL

## 2021-11-02 MED ORDER — ACETAMINOPHEN 500 MG PO TABS
ORAL_TABLET | ORAL | Status: AC
Start: 2021-11-02 — End: ?
  Filled 2021-11-02: qty 2

## 2021-11-02 MED ORDER — LIDOCAINE 2% (20 MG/ML) 5 ML SYRINGE
INTRAMUSCULAR | Status: DC | PRN
  Administered 2021-11-02: 30 mg via INTRAVENOUS

## 2021-11-02 MED ORDER — LIDOCAINE 2% (20 MG/ML) 5 ML SYRINGE
INTRAMUSCULAR | Status: AC
  Filled 2021-11-02: qty 5

## 2021-11-02 MED ORDER — ONDANSETRON 4 MG PO TBDP: 4.0000 mg | ORAL_TABLET | Freq: Two times a day (BID) | ORAL | 0 refills | Status: DC | PRN

## 2021-11-02 MED ORDER — FENTANYL CITRATE (PF) 100 MCG/2ML IJ SOLN
50.0000 ug | Freq: Once | INTRAMUSCULAR | Status: AC
  Administered 2021-11-02: 50 ug via INTRAVENOUS

## 2021-11-02 MED ORDER — MIDAZOLAM HCL 2 MG/2ML IJ SOLN
1.0000 mg | Freq: Once | INTRAMUSCULAR | Status: AC
  Administered 2021-11-02: 1 mg via INTRAVENOUS

## 2021-11-02 MED ORDER — ACETAMINOPHEN 500 MG PO TABS
1000.0000 mg | ORAL_TABLET | Freq: Once | ORAL | Status: AC
  Administered 2021-11-02: 1000 mg via ORAL

## 2021-11-02 MED ORDER — ONDANSETRON HCL 4 MG/2ML IJ SOLN
INTRAMUSCULAR | Status: AC
  Filled 2021-11-02: qty 2

## 2021-11-02 MED ORDER — SUCCINYLCHOLINE CHLORIDE 200 MG/10ML IV SOSY
PREFILLED_SYRINGE | INTRAVENOUS | Status: AC
  Filled 2021-11-02: qty 10

## 2021-11-02 MED ORDER — MELOXICAM 15 MG PO TABS: 15.0000 mg | ORAL_TABLET | Freq: Every day | ORAL | 1 refills | Status: DC | PRN

## 2021-11-02 MED ORDER — ACETAMINOPHEN 500 MG PO TABS: 1000.0000 mg | ORAL_TABLET | Freq: Four times a day (QID) | ORAL | 0 refills | Status: DC | PRN

## 2021-11-02 MED ORDER — ONDANSETRON HCL 4 MG/2ML IJ SOLN
INTRAMUSCULAR | Status: DC | PRN
  Administered 2021-11-02: 4 mg via INTRAVENOUS

## 2021-11-02 MED ORDER — DEXAMETHASONE SODIUM PHOSPHATE 10 MG/ML IJ SOLN: 8.0000 mg | Freq: Once | INTRAMUSCULAR | Status: DC

## 2021-11-02 MED ORDER — 0.9 % SODIUM CHLORIDE (POUR BTL) OPTIME
TOPICAL | Status: DC | PRN
  Administered 2021-11-02: 120 mL

## 2021-11-02 MED ORDER — LACTATED RINGERS IV SOLN: INTRAVENOUS | Status: DC

## 2021-11-02 MED ORDER — POVIDONE-IODINE 10 % EX SWAB
2.0000 "application " | Freq: Once | CUTANEOUS | Status: AC
  Administered 2021-11-02: 2 via TOPICAL

## 2021-11-02 MED ORDER — FENTANYL CITRATE (PF) 100 MCG/2ML IJ SOLN
INTRAMUSCULAR | Status: DC | PRN
  Administered 2021-11-02: 50 ug via INTRAVENOUS

## 2021-11-02 MED ORDER — PHENYLEPHRINE 40 MCG/ML (10ML) SYRINGE FOR IV PUSH (FOR BLOOD PRESSURE SUPPORT)
PREFILLED_SYRINGE | INTRAVENOUS | Status: AC
  Filled 2021-11-02: qty 10

## 2021-11-02 MED ORDER — MIDAZOLAM HCL 5 MG/5ML IJ SOLN
INTRAMUSCULAR | Status: DC | PRN
  Administered 2021-11-02: 1 mg via INTRAVENOUS

## 2021-11-02 MED ORDER — ASPIRIN EC 81 MG PO TBEC: 81.0000 mg | DELAYED_RELEASE_TABLET | Freq: Two times a day (BID) | ORAL | 0 refills | Status: DC

## 2021-11-02 MED ORDER — CEFAZOLIN IN SODIUM CHLORIDE 3-0.9 GM/100ML-% IV SOLN
INTRAVENOUS | Status: AC
  Filled 2021-11-02: qty 100

## 2021-11-02 MED ORDER — MIDAZOLAM HCL 2 MG/2ML IJ SOLN
INTRAMUSCULAR | Status: AC
  Filled 2021-11-02: qty 2

## 2021-11-02 MED ORDER — CLONIDINE HCL (ANALGESIA) 100 MCG/ML EP SOLN
EPIDURAL | Status: DC | PRN
  Administered 2021-11-02: 50 ug

## 2021-11-02 MED ORDER — PROPOFOL 500 MG/50ML IV EMUL
INTRAVENOUS | Status: DC | PRN
  Administered 2021-11-02: 115 ug/kg/min via INTRAVENOUS

## 2021-11-02 MED ORDER — PROPOFOL 10 MG/ML IV BOLUS
INTRAVENOUS | Status: DC | PRN
  Administered 2021-11-02: 20 mg via INTRAVENOUS

## 2021-11-02 SURGICAL SUPPLY — 70 items
APL PRP STRL LF DISP 70% ISPRP (MISCELLANEOUS) ×1
BIT DRILL CANN 2.1 IS1113 (BIT) ×1 IMPLANT
BLADE SURG 15 STRL LF DISP TIS (BLADE) ×2 IMPLANT
BLADE SURG 15 STRL SS (BLADE) ×4
BNDG CMPR 9X4 STRL LF SNTH (GAUZE/BANDAGES/DRESSINGS) ×1
BNDG ELASTIC 4X5.8 VLCR STR LF (GAUZE/BANDAGES/DRESSINGS) ×2 IMPLANT
BNDG ESMARK 4X9 LF (GAUZE/BANDAGES/DRESSINGS) ×2 IMPLANT
CHLORAPREP W/TINT 26 (MISCELLANEOUS) ×2 IMPLANT
CLSR STERI-STRIP ANTIMIC 1/2X4 (GAUZE/BANDAGES/DRESSINGS) ×2 IMPLANT
CORD BIPOLAR FORCEPS 12FT (ELECTRODE) ×2 IMPLANT
COVER BACK TABLE 60X90IN (DRAPES) ×2 IMPLANT
CUFF TOURN SGL QUICK 18X4 (TOURNIQUET CUFF) IMPLANT
CUFF TOURN SGL QUICK 24 (TOURNIQUET CUFF)
CUFF TRNQT CYL 24X4X16.5-23 (TOURNIQUET CUFF) IMPLANT
DRAPE EXTREMITY T 121X128X90 (DISPOSABLE) ×2 IMPLANT
DRAPE IMP U-DRAPE 54X76 (DRAPES) ×2 IMPLANT
DRAPE OEC MINIVIEW 54X84 (DRAPES) ×2 IMPLANT
DRAPE SURG 17X23 STRL (DRAPES) IMPLANT
DRAPE U-SHAPE 47X51 STRL (DRAPES) ×1 IMPLANT
DRSG EMULSION OIL 3X3 NADH (GAUZE/BANDAGES/DRESSINGS) ×2 IMPLANT
DRSG PAD ABDOMINAL 8X10 ST (GAUZE/BANDAGES/DRESSINGS) ×4 IMPLANT
GAUZE SPONGE 4X4 12PLY STRL (GAUZE/BANDAGES/DRESSINGS) ×2 IMPLANT
GAUZE XEROFORM 1X8 LF (GAUZE/BANDAGES/DRESSINGS) IMPLANT
GLOVE SRG 8 PF TXTR STRL LF DI (GLOVE) ×1 IMPLANT
GLOVE SURG ENC MOIS LTX SZ7.5 (GLOVE) ×4 IMPLANT
GLOVE SURG UNDER POLY LF SZ7.5 (GLOVE) ×4 IMPLANT
GLOVE SURG UNDER POLY LF SZ8 (GLOVE) ×2
GOWN STRL REUS W/ TWL LRG LVL3 (GOWN DISPOSABLE) ×2 IMPLANT
GOWN STRL REUS W/ TWL XL LVL3 (GOWN DISPOSABLE) ×1 IMPLANT
GOWN STRL REUS W/TWL LRG LVL3 (GOWN DISPOSABLE) ×4
GOWN STRL REUS W/TWL XL LVL3 (GOWN DISPOSABLE) ×2
K-WIRE 3.0 NON-THREADED (WIRE) ×4
KWIRE 3.0 NON-THREADED (WIRE) IMPLANT
NDL HYPO 25X1 1.5 SAFETY (NEEDLE) ×1 IMPLANT
NEEDLE HYPO 25X1 1.5 SAFETY (NEEDLE) ×2 IMPLANT
NS IRRIG 1000ML POUR BTL (IV SOLUTION) ×2 IMPLANT
PACK BASIN DAY SURGERY FS (CUSTOM PROCEDURE TRAY) ×2 IMPLANT
PAD CAST 4YDX4 CTTN HI CHSV (CAST SUPPLIES) ×1 IMPLANT
PADDING CAST ABS 4INX4YD NS (CAST SUPPLIES) ×1
PADDING CAST ABS COTTON 4X4 ST (CAST SUPPLIES) ×1 IMPLANT
PADDING CAST COTTON 4X4 STRL (CAST SUPPLIES) ×2
SCREW CANN 3.0X36 (Screw) ×2 IMPLANT
SCREW CANN 36X3XHD NS MN (Screw) IMPLANT
SLEEVE SCD COMPRESS KNEE MED (STOCKING) IMPLANT
SLING ARM FOAM STRAP XLG (SOFTGOODS) ×1 IMPLANT
SPIKE FLUID TRANSFER (MISCELLANEOUS) IMPLANT
SPLINT FAST PLASTER 5X30 (CAST SUPPLIES)
SPLINT PLASTER CAST FAST 5X30 (CAST SUPPLIES) IMPLANT
SPLINT PLASTER CAST XFAST 3X15 (CAST SUPPLIES) IMPLANT
SPLINT PLASTER CAST XFAST 4X15 (CAST SUPPLIES) IMPLANT
SPLINT PLASTER XTRA FAST SET 4 (CAST SUPPLIES) ×10
SPLINT PLASTER XTRA FASTSET 3X (CAST SUPPLIES)
SUCTION FRAZIER HANDLE 10FR (MISCELLANEOUS)
SUCTION TUBE FRAZIER 10FR DISP (MISCELLANEOUS) IMPLANT
SUT ETHILON 3 0 PS 1 (SUTURE) IMPLANT
SUT MON AB 2-0 CT1 36 (SUTURE) ×2 IMPLANT
SUT MON AB 4-0 PC3 18 (SUTURE) IMPLANT
SUT MON AB 4-0 PS1 27 (SUTURE) ×1 IMPLANT
SUT PROLENE 3 0 PS 2 (SUTURE) IMPLANT
SUT VIC AB 2-0 SH 27 (SUTURE)
SUT VIC AB 2-0 SH 27XBRD (SUTURE) IMPLANT
SUT VIC AB 3-0 FS2 27 (SUTURE) IMPLANT
SUT VIC AB 3-0 SH 27 (SUTURE) ×2
SUT VIC AB 3-0 SH 27X BRD (SUTURE) IMPLANT
SUT VICRYL 0 SH 27 (SUTURE) IMPLANT
SYR BULB EAR ULCER 3OZ GRN STR (SYRINGE) ×2 IMPLANT
SYR CONTROL 10ML LL (SYRINGE) ×2 IMPLANT
TOWEL GREEN STERILE FF (TOWEL DISPOSABLE) ×2 IMPLANT
TUBE CONNECTING 20X1/4 (TUBING) IMPLANT
UNDERPAD 30X36 HEAVY ABSORB (UNDERPADS AND DIAPERS) ×2 IMPLANT

## 2021-11-02 NOTE — Anesthesia Preprocedure Evaluation (Signed)
Anesthesia Evaluation  ?Patient identified by MRN, date of birth, ID band ?Patient awake ? ? ? ?Reviewed: ?Allergy & Precautions, NPO status , Patient's Chart, lab work & pertinent test results ? ?Airway ?Mallampati: III ? ?TM Distance: >3 FB ? ? ? ? Dental ?  ?Pulmonary ?asthma , sleep apnea and Continuous Positive Airway Pressure Ventilation ,  ?  ?breath sounds clear to auscultation ? ? ? ? ? ? Cardiovascular ?hypertension, Pt. on medications ? ?Rhythm:Regular Rate:Normal ? ? ?  ?Neuro/Psych ?negative neurological ROS ?   ? GI/Hepatic ?Neg liver ROS, GERD  ,  ?Endo/Other  ?diabetes, Type 2Morbid obesity ? Renal/GU ?negative Renal ROS  ? ?  ?Musculoskeletal ? ? Abdominal ?  ?Peds ? Hematology ?negative hematology ROS ?(+)   ?Anesthesia Other Findings ? ? Reproductive/Obstetrics ? ?  ? ? ? ? ? ? ? ? ? ? ? ? ? ?  ?  ? ? ? ? ? ? ? ? ?Anesthesia Physical ?Anesthesia Plan ? ?ASA: 3 ? ?Anesthesia Plan: Regional and MAC  ? ?Post-op Pain Management: Regional block* and Tylenol PO (pre-op)*  ? ?Induction:  ? ?PONV Risk Score and Plan: 1 and Propofol infusion, Ondansetron and Treatment may vary due to age or medical condition ? ?Airway Management Planned: Natural Airway and Simple Face Mask ? ?Additional Equipment: None ? ?Intra-op Plan:  ? ?Post-operative Plan:  ? ?Informed Consent: I have reviewed the patients History and Physical, chart, labs and discussed the procedure including the risks, benefits and alternatives for the proposed anesthesia with the patient or authorized representative who has indicated his/her understanding and acceptance.  ? ? ? ? ? ?Plan Discussed with: CRNA ? ?Anesthesia Plan Comments:   ? ? ? ? ? ? ?Anesthesia Quick Evaluation ? ?

## 2021-11-02 NOTE — Transfer of Care (Signed)
Immediate Anesthesia Transfer of Care Note ? ?Patient: Brian Reilly ? ?Procedure(s) Performed: OPEN REDUCTION INTERNAL FIXATION (ORIF) WRIST FRACTURE, RADIAL STYLOID FRACTURE AND DEQUERVAIN'S RELEASE (Right: Wrist) ? ?Patient Location: PACU ? ?Anesthesia Type:MAC combined with regional for post-op pain ? ?Level of Consciousness: awake, alert  and oriented ? ?Airway & Oxygen Therapy: Patient Spontanous Breathing and Patient connected to face mask oxygen ? ?Post-op Assessment: Report given to RN and Post -op Vital signs reviewed and stable ? ?Post vital signs: Reviewed and stable ? ?Last Vitals:  ?Vitals Value Taken Time  ?BP    ?Temp    ?Pulse    ?Resp    ?SpO2    ? ? ?Last Pain:  ?Vitals:  ? 11/02/21 1217  ?TempSrc: Oral  ?PainSc: 10-Worst pain ever  ?   ? ?Patients Stated Pain Goal: 5 (11/02/21 1217) ? ?Complications: No notable events documented. ?

## 2021-11-02 NOTE — Progress Notes (Signed)
Assisted Dr. Rob Fitzgerald with right, axillary, ultrasound guided block. Side rails up, monitors on throughout procedure. See vital signs in flow sheet. Tolerated Procedure well. ?

## 2021-11-02 NOTE — Interval H&P Note (Signed)
History and Physical Interval Note: ? ?11/02/2021 ?12:36 PM ? ?Brian Reilly  has presented today for surgery, with the diagnosis of RIGHT WRIST FRACTURE.  The various methods of treatment have been discussed with the patient and family. After consideration of risks, benefits and other options for treatment, the patient has consented to  Procedure(s): ?OPEN REDUCTION INTERNAL FIXATION (ORIF) WRIST FRACTURE, RADIAL STYLOID FRACTURE AND DEQUERVAIN'S RELEASE (Right) as a surgical intervention.  The patient's history has been reviewed, patient examined, no change in status, stable for surgery.  I have reviewed the patient's chart and labs.  Questions were answered to the patient's satisfaction.   ? ? ?Sheral Apley ? ? ?

## 2021-11-02 NOTE — Op Note (Signed)
11/02/2021 ? ?2:14 PM ? ?PATIENT:  Brian Reilly   ? ?PRE-OPERATIVE DIAGNOSIS:  RIGHT WRIST FRACTURE ? ?POST-OPERATIVE DIAGNOSIS:  Same ? ?PROCEDURE:  OPEN REDUCTION INTERNAL FIXATION (ORIF) WRIST FRACTURE, RADIAL STYLOID FRACTURE AND DEQUERVAIN'S RELEASE ? ?SURGEON:  Sheral Apley, MD ? ?ASSISTANT: Levester Fresh, PA-C, he was present and scrubbed throughout the case, critical for completion in a timely fashion, and for retraction, instrumentation, and closure. ? ?ANESTHESIA:   block and general ? ?PREOPERATIVE INDICATIONS:  Brian Reilly is a  61 y.o. male with a diagnosis of RIGHT WRIST FRACTURE who failed conservative measures and elected for surgical management.   ? ?The risks benefits and alternatives were discussed with the patient preoperatively including but not limited to the risks of infection, bleeding, nerve injury, cardiopulmonary complications, the need for revision surgery, among others, and the patient was willing to proceed. ? ?OPERATIVE IMPLANTS: stryker screw ? ?OPERATIVE FINDINGS: Compressive dorsal compartment with tenosynovitis of the tendons with intraarticular radial fracture of the styloid ? ?BLOOD LOSS: min ? ?COMPLICATIONS: none ? ?TOURNIQUET TIME: ? ?OPERATIVE PROCEDURE:  Patient was identified in the preoperative holding area and site was marked by me He was transported to the operating theater and placed on the table in supine position taking care to pad all bony prominences. After a preincinduction time out anesthesia was induced. The R upper extremity was prepped and draped in normal sterile fashion and a pre-incision timeout was performed. He received ancef for preoperative antibiotics.  ? ?I made a transverse incision and dissected to the 1st dorsal compartments.  ? ?I identified sensory radial nerve branches and protected and retracted these. ? ?I then made a dorsal incision over the 1st dorsal compartment and released this.  ? ?I identified both Tendons of the  compartment and confirmed no subsheath. These were then mobilized and translated well.  ? ?Tenolysis was performed of the tenosynovitis.  ? ?I identified the fracture site.  ?I reduced this and placed a K wire.  ? ?I took 4+ xrays of the wrist and was happy with reduction. ? ?I next placed a cannulated screw across the fracture for fixation ? ?I then irrigated and closed the skin incision.  ? ?Sterile dressing was placed.  ? ?POST OPERATIVE PLAN: dressings for 5days. Mobilized for dvt px.  ? ? ? ? ? ? ? ?

## 2021-11-02 NOTE — Discharge Instructions (Addendum)
POST-OPERATIVE OPIOID TAPER INSTRUCTIONS: ?It is important to wean off of your opioid medication as soon as possible. If you do not need pain medication after your surgery it is ok to stop day one. ?Opioids include: ?Codeine, Hydrocodone(Norco, Vicodin), Oxycodone(Percocet, oxycontin) and hydromorphone amongst others.  ?Long term and even short term use of opiods can cause: ?Increased pain response ?Dependence ?Constipation ?Depression ?Respiratory depression ?And more.  ?Withdrawal symptoms can include ?Flu like symptoms ?Nausea, vomiting ?And more ?Techniques to manage these symptoms ?Hydrate well ?Eat regular healthy meals ?Stay active ?Use relaxation techniques(deep breathing, meditating, yoga) ?Do Not substitute Alcohol to help with tapering ?If you have been on opioids for less than two weeks and do not have pain than it is ok to stop all together.  ?Plan to wean off of opioids ?This plan should start within one week post op of your joint replacement. ?Maintain the same interval or time between taking each dose and first decrease the dose.  ?Cut the total daily intake of opioids by one tablet each day ?Next start to increase the time between doses. ?The last dose that should be eliminated is the evening dose.   ? ?May take Tylenol after 6:30pm, if needed.  ? ? ?Post Anesthesia Home Care Instructions ? ?Activity: ?Get plenty of rest for the remainder of the day. A responsible individual must stay with you for 24 hours following the procedure.  ?For the next 24 hours, DO NOT: ?-Drive a car ?-Advertising copywriter ?-Drink alcoholic beverages ?-Take any medication unless instructed by your physician ?-Make any legal decisions or sign important papers. ? ?Meals: ?Start with liquid foods such as gelatin or soup. Progress to regular foods as tolerated. Avoid greasy, spicy, heavy foods. If nausea and/or vomiting occur, drink only clear liquids until the nausea and/or vomiting subsides. Call your physician if vomiting  continues. ? ?Special Instructions/Symptoms: ?Your throat may feel dry or sore from the anesthesia or the breathing tube placed in your throat during surgery. If this causes discomfort, gargle with warm salt water. The discomfort should disappear within 24 hours. ? ?If you had a scopolamine patch placed behind your ear for the management of post- operative nausea and/or vomiting: ? ?1. The medication in the patch is effective for 72 hours, after which it should be removed.  Wrap patch in a tissue and discard in the trash. Wash hands thoroughly with soap and water. ?2. You may remove the patch earlier than 72 hours if you experience unpleasant side effects which may include dry mouth, dizziness or visual disturbances. ?3. Avoid touching the patch. Wash your hands with soap and water after contact with the patch. ?    ?

## 2021-11-02 NOTE — Anesthesia Procedure Notes (Signed)
Anesthesia Regional Block: Axillary brachial plexus block  ? ?Pre-Anesthetic Checklist: , timeout performed,  Correct Patient, Correct Site, Correct Laterality,  Correct Procedure, Correct Position, site marked,  Risks and benefits discussed,  Surgical consent,  Pre-op evaluation,  At surgeon's request and post-op pain management ? ?Laterality: Right ? ?Prep: chloraprep     ?  ?Needles:  ?Injection technique: Single-shot ? ?Needle Type: Echogenic Stimulator Needle   ? ? ?Needle Length: 9cm  ?Needle Gauge: 21  ? ? ? ?Additional Needles: ? ? ?Procedures:, nerve stimulator,,, ultrasound used (permanent image in chart),,    ? ?Nerve Stimulator or Paresthesia:  ?Response: MC, Ulnar, median and radial response, 0.5 mA ? ?Additional Responses:  ? ?Narrative:  ?Start time: 11/02/2021 1:27 PM ?End time: 11/02/2021 1:37 PM ?Injection made incrementally with aspirations every 5 mL. ? ?Performed by: Personally  ?Anesthesiologist: Marcene Duos, MD ? ? ? ? ?

## 2021-11-03 NOTE — Anesthesia Postprocedure Evaluation (Signed)
Anesthesia Post Note ? ?Patient: Brian Reilly ? ?Procedure(s) Performed: RELEASE FIRST DORSAL COMPARTMENT (DEQUERVAIN) (Right: Wrist) ?OPEN REDUCTION INTERNAL FIXATION (ORIF) WRIST FRACTURE, RADIAL STYLOID FRACTURE (Right: Wrist) ? ?  ? ?Patient location during evaluation: PACU ?Anesthesia Type: Regional ?Level of consciousness: awake and alert ?Pain management: pain level controlled ?Vital Signs Assessment: post-procedure vital signs reviewed and stable ?Respiratory status: spontaneous breathing, nonlabored ventilation, respiratory function stable and patient connected to nasal cannula oxygen ?Cardiovascular status: stable and blood pressure returned to baseline ?Postop Assessment: no apparent nausea or vomiting ?Anesthetic complications: no ? ? ?No notable events documented. ? ?Last Vitals:  ?Vitals:  ? 11/02/21 1528 11/02/21 1539  ?BP:  121/77  ?Pulse: 82 78  ?Resp: 13 16  ?Temp:  36.6 ?C  ?SpO2: 93% 96%  ?  ?Last Pain:  ?Vitals:  ? 11/02/21 1539  ?TempSrc:   ?PainSc: 0-No pain  ? ? ?  ?  ?  ?  ?  ?  ? ?Marcene Duos E ? ? ? ? ?

## 2021-11-07 ENCOUNTER — Encounter (HOSPITAL_BASED_OUTPATIENT_CLINIC_OR_DEPARTMENT_OTHER): Payer: Self-pay | Admitting: Orthopedic Surgery

## 2021-11-07 ENCOUNTER — Other Ambulatory Visit: Payer: Self-pay | Admitting: Emergency Medicine

## 2021-11-07 DIAGNOSIS — E1129 Type 2 diabetes mellitus with other diabetic kidney complication: Secondary | ICD-10-CM

## 2021-11-07 DIAGNOSIS — R809 Proteinuria, unspecified: Secondary | ICD-10-CM

## 2021-11-07 DIAGNOSIS — E785 Hyperlipidemia, unspecified: Secondary | ICD-10-CM

## 2021-11-12 DIAGNOSIS — M654 Radial styloid tenosynovitis [de Quervain]: Secondary | ICD-10-CM | POA: Diagnosis not present

## 2021-12-10 DIAGNOSIS — S52501D Unspecified fracture of the lower end of right radius, subsequent encounter for closed fracture with routine healing: Secondary | ICD-10-CM | POA: Diagnosis not present

## 2021-12-20 DIAGNOSIS — R531 Weakness: Secondary | ICD-10-CM | POA: Diagnosis not present

## 2021-12-20 DIAGNOSIS — M25641 Stiffness of right hand, not elsewhere classified: Secondary | ICD-10-CM | POA: Diagnosis not present

## 2021-12-20 DIAGNOSIS — M25441 Effusion, right hand: Secondary | ICD-10-CM | POA: Diagnosis not present

## 2021-12-20 DIAGNOSIS — M25631 Stiffness of right wrist, not elsewhere classified: Secondary | ICD-10-CM | POA: Diagnosis not present

## 2021-12-25 DIAGNOSIS — M25641 Stiffness of right hand, not elsewhere classified: Secondary | ICD-10-CM | POA: Diagnosis not present

## 2021-12-25 DIAGNOSIS — R531 Weakness: Secondary | ICD-10-CM | POA: Diagnosis not present

## 2021-12-25 DIAGNOSIS — M25631 Stiffness of right wrist, not elsewhere classified: Secondary | ICD-10-CM | POA: Diagnosis not present

## 2021-12-25 DIAGNOSIS — M25441 Effusion, right hand: Secondary | ICD-10-CM | POA: Diagnosis not present

## 2021-12-26 DIAGNOSIS — R531 Weakness: Secondary | ICD-10-CM | POA: Diagnosis not present

## 2021-12-26 DIAGNOSIS — M25631 Stiffness of right wrist, not elsewhere classified: Secondary | ICD-10-CM | POA: Diagnosis not present

## 2021-12-26 DIAGNOSIS — M25441 Effusion, right hand: Secondary | ICD-10-CM | POA: Diagnosis not present

## 2021-12-26 DIAGNOSIS — M25641 Stiffness of right hand, not elsewhere classified: Secondary | ICD-10-CM | POA: Diagnosis not present

## 2021-12-27 DIAGNOSIS — M25631 Stiffness of right wrist, not elsewhere classified: Secondary | ICD-10-CM | POA: Diagnosis not present

## 2021-12-27 DIAGNOSIS — M25641 Stiffness of right hand, not elsewhere classified: Secondary | ICD-10-CM | POA: Diagnosis not present

## 2021-12-27 DIAGNOSIS — M25441 Effusion, right hand: Secondary | ICD-10-CM | POA: Diagnosis not present

## 2021-12-27 DIAGNOSIS — R531 Weakness: Secondary | ICD-10-CM | POA: Diagnosis not present

## 2022-01-01 DIAGNOSIS — M25641 Stiffness of right hand, not elsewhere classified: Secondary | ICD-10-CM | POA: Diagnosis not present

## 2022-01-01 DIAGNOSIS — R531 Weakness: Secondary | ICD-10-CM | POA: Diagnosis not present

## 2022-01-01 DIAGNOSIS — M25631 Stiffness of right wrist, not elsewhere classified: Secondary | ICD-10-CM | POA: Diagnosis not present

## 2022-01-01 DIAGNOSIS — M25441 Effusion, right hand: Secondary | ICD-10-CM | POA: Diagnosis not present

## 2022-01-02 DIAGNOSIS — M25441 Effusion, right hand: Secondary | ICD-10-CM | POA: Diagnosis not present

## 2022-01-02 DIAGNOSIS — M25631 Stiffness of right wrist, not elsewhere classified: Secondary | ICD-10-CM | POA: Diagnosis not present

## 2022-01-02 DIAGNOSIS — M25641 Stiffness of right hand, not elsewhere classified: Secondary | ICD-10-CM | POA: Diagnosis not present

## 2022-01-02 DIAGNOSIS — R531 Weakness: Secondary | ICD-10-CM | POA: Diagnosis not present

## 2022-01-03 DIAGNOSIS — R531 Weakness: Secondary | ICD-10-CM | POA: Diagnosis not present

## 2022-01-03 DIAGNOSIS — M25441 Effusion, right hand: Secondary | ICD-10-CM | POA: Diagnosis not present

## 2022-01-03 DIAGNOSIS — M25631 Stiffness of right wrist, not elsewhere classified: Secondary | ICD-10-CM | POA: Diagnosis not present

## 2022-01-03 DIAGNOSIS — M25641 Stiffness of right hand, not elsewhere classified: Secondary | ICD-10-CM | POA: Diagnosis not present

## 2022-01-08 DIAGNOSIS — M25641 Stiffness of right hand, not elsewhere classified: Secondary | ICD-10-CM | POA: Diagnosis not present

## 2022-01-08 DIAGNOSIS — R531 Weakness: Secondary | ICD-10-CM | POA: Diagnosis not present

## 2022-01-08 DIAGNOSIS — M25441 Effusion, right hand: Secondary | ICD-10-CM | POA: Diagnosis not present

## 2022-01-08 DIAGNOSIS — M25631 Stiffness of right wrist, not elsewhere classified: Secondary | ICD-10-CM | POA: Diagnosis not present

## 2022-01-09 DIAGNOSIS — M25441 Effusion, right hand: Secondary | ICD-10-CM | POA: Diagnosis not present

## 2022-01-09 DIAGNOSIS — M25641 Stiffness of right hand, not elsewhere classified: Secondary | ICD-10-CM | POA: Diagnosis not present

## 2022-01-09 DIAGNOSIS — R531 Weakness: Secondary | ICD-10-CM | POA: Diagnosis not present

## 2022-01-09 DIAGNOSIS — M25631 Stiffness of right wrist, not elsewhere classified: Secondary | ICD-10-CM | POA: Diagnosis not present

## 2022-01-10 DIAGNOSIS — R531 Weakness: Secondary | ICD-10-CM | POA: Diagnosis not present

## 2022-01-10 DIAGNOSIS — M25441 Effusion, right hand: Secondary | ICD-10-CM | POA: Diagnosis not present

## 2022-01-10 DIAGNOSIS — M25631 Stiffness of right wrist, not elsewhere classified: Secondary | ICD-10-CM | POA: Diagnosis not present

## 2022-01-10 DIAGNOSIS — M25641 Stiffness of right hand, not elsewhere classified: Secondary | ICD-10-CM | POA: Diagnosis not present

## 2022-01-15 DIAGNOSIS — M25641 Stiffness of right hand, not elsewhere classified: Secondary | ICD-10-CM | POA: Diagnosis not present

## 2022-01-15 DIAGNOSIS — M25441 Effusion, right hand: Secondary | ICD-10-CM | POA: Diagnosis not present

## 2022-01-15 DIAGNOSIS — M25631 Stiffness of right wrist, not elsewhere classified: Secondary | ICD-10-CM | POA: Diagnosis not present

## 2022-01-15 DIAGNOSIS — R531 Weakness: Secondary | ICD-10-CM | POA: Diagnosis not present

## 2022-01-16 DIAGNOSIS — S52501D Unspecified fracture of the lower end of right radius, subsequent encounter for closed fracture with routine healing: Secondary | ICD-10-CM | POA: Diagnosis not present

## 2022-01-17 DIAGNOSIS — M25631 Stiffness of right wrist, not elsewhere classified: Secondary | ICD-10-CM | POA: Diagnosis not present

## 2022-01-17 DIAGNOSIS — M25641 Stiffness of right hand, not elsewhere classified: Secondary | ICD-10-CM | POA: Diagnosis not present

## 2022-01-17 DIAGNOSIS — M25441 Effusion, right hand: Secondary | ICD-10-CM | POA: Diagnosis not present

## 2022-01-17 DIAGNOSIS — R531 Weakness: Secondary | ICD-10-CM | POA: Diagnosis not present

## 2022-01-22 DIAGNOSIS — M25631 Stiffness of right wrist, not elsewhere classified: Secondary | ICD-10-CM | POA: Diagnosis not present

## 2022-01-22 DIAGNOSIS — M25641 Stiffness of right hand, not elsewhere classified: Secondary | ICD-10-CM | POA: Diagnosis not present

## 2022-01-22 DIAGNOSIS — R531 Weakness: Secondary | ICD-10-CM | POA: Diagnosis not present

## 2022-01-22 DIAGNOSIS — M25441 Effusion, right hand: Secondary | ICD-10-CM | POA: Diagnosis not present

## 2022-01-23 DIAGNOSIS — M25631 Stiffness of right wrist, not elsewhere classified: Secondary | ICD-10-CM | POA: Diagnosis not present

## 2022-01-23 DIAGNOSIS — M25441 Effusion, right hand: Secondary | ICD-10-CM | POA: Diagnosis not present

## 2022-01-23 DIAGNOSIS — M25641 Stiffness of right hand, not elsewhere classified: Secondary | ICD-10-CM | POA: Diagnosis not present

## 2022-01-23 DIAGNOSIS — R531 Weakness: Secondary | ICD-10-CM | POA: Diagnosis not present

## 2022-01-24 DIAGNOSIS — M25441 Effusion, right hand: Secondary | ICD-10-CM | POA: Diagnosis not present

## 2022-01-24 DIAGNOSIS — M25631 Stiffness of right wrist, not elsewhere classified: Secondary | ICD-10-CM | POA: Diagnosis not present

## 2022-01-24 DIAGNOSIS — M25641 Stiffness of right hand, not elsewhere classified: Secondary | ICD-10-CM | POA: Diagnosis not present

## 2022-01-24 DIAGNOSIS — R531 Weakness: Secondary | ICD-10-CM | POA: Diagnosis not present

## 2022-01-29 DIAGNOSIS — M25631 Stiffness of right wrist, not elsewhere classified: Secondary | ICD-10-CM | POA: Diagnosis not present

## 2022-01-29 DIAGNOSIS — R531 Weakness: Secondary | ICD-10-CM | POA: Diagnosis not present

## 2022-01-29 DIAGNOSIS — M25641 Stiffness of right hand, not elsewhere classified: Secondary | ICD-10-CM | POA: Diagnosis not present

## 2022-01-29 DIAGNOSIS — M25441 Effusion, right hand: Secondary | ICD-10-CM | POA: Diagnosis not present

## 2022-01-30 DIAGNOSIS — R531 Weakness: Secondary | ICD-10-CM | POA: Diagnosis not present

## 2022-01-30 DIAGNOSIS — M25641 Stiffness of right hand, not elsewhere classified: Secondary | ICD-10-CM | POA: Diagnosis not present

## 2022-01-30 DIAGNOSIS — M25441 Effusion, right hand: Secondary | ICD-10-CM | POA: Diagnosis not present

## 2022-01-30 DIAGNOSIS — M25631 Stiffness of right wrist, not elsewhere classified: Secondary | ICD-10-CM | POA: Diagnosis not present

## 2022-01-31 DIAGNOSIS — R531 Weakness: Secondary | ICD-10-CM | POA: Diagnosis not present

## 2022-01-31 DIAGNOSIS — M25441 Effusion, right hand: Secondary | ICD-10-CM | POA: Diagnosis not present

## 2022-01-31 DIAGNOSIS — M25641 Stiffness of right hand, not elsewhere classified: Secondary | ICD-10-CM | POA: Diagnosis not present

## 2022-01-31 DIAGNOSIS — M25631 Stiffness of right wrist, not elsewhere classified: Secondary | ICD-10-CM | POA: Diagnosis not present

## 2022-02-04 DIAGNOSIS — M25631 Stiffness of right wrist, not elsewhere classified: Secondary | ICD-10-CM | POA: Diagnosis not present

## 2022-02-04 DIAGNOSIS — M25641 Stiffness of right hand, not elsewhere classified: Secondary | ICD-10-CM | POA: Diagnosis not present

## 2022-02-04 DIAGNOSIS — R531 Weakness: Secondary | ICD-10-CM | POA: Diagnosis not present

## 2022-02-04 DIAGNOSIS — M25441 Effusion, right hand: Secondary | ICD-10-CM | POA: Diagnosis not present

## 2022-02-06 DIAGNOSIS — M25641 Stiffness of right hand, not elsewhere classified: Secondary | ICD-10-CM | POA: Diagnosis not present

## 2022-02-06 DIAGNOSIS — M25441 Effusion, right hand: Secondary | ICD-10-CM | POA: Diagnosis not present

## 2022-02-06 DIAGNOSIS — M25631 Stiffness of right wrist, not elsewhere classified: Secondary | ICD-10-CM | POA: Diagnosis not present

## 2022-02-06 DIAGNOSIS — R531 Weakness: Secondary | ICD-10-CM | POA: Diagnosis not present

## 2022-02-08 DIAGNOSIS — R531 Weakness: Secondary | ICD-10-CM | POA: Diagnosis not present

## 2022-02-08 DIAGNOSIS — M25631 Stiffness of right wrist, not elsewhere classified: Secondary | ICD-10-CM | POA: Diagnosis not present

## 2022-02-08 DIAGNOSIS — M25641 Stiffness of right hand, not elsewhere classified: Secondary | ICD-10-CM | POA: Diagnosis not present

## 2022-02-08 DIAGNOSIS — M25441 Effusion, right hand: Secondary | ICD-10-CM | POA: Diagnosis not present

## 2022-02-13 DIAGNOSIS — M25631 Stiffness of right wrist, not elsewhere classified: Secondary | ICD-10-CM | POA: Diagnosis not present

## 2022-02-13 DIAGNOSIS — M25641 Stiffness of right hand, not elsewhere classified: Secondary | ICD-10-CM | POA: Diagnosis not present

## 2022-02-13 DIAGNOSIS — R531 Weakness: Secondary | ICD-10-CM | POA: Diagnosis not present

## 2022-02-13 DIAGNOSIS — M25441 Effusion, right hand: Secondary | ICD-10-CM | POA: Diagnosis not present

## 2022-02-15 DIAGNOSIS — R531 Weakness: Secondary | ICD-10-CM | POA: Diagnosis not present

## 2022-02-15 DIAGNOSIS — M25441 Effusion, right hand: Secondary | ICD-10-CM | POA: Diagnosis not present

## 2022-02-15 DIAGNOSIS — M25631 Stiffness of right wrist, not elsewhere classified: Secondary | ICD-10-CM | POA: Diagnosis not present

## 2022-02-15 DIAGNOSIS — M25641 Stiffness of right hand, not elsewhere classified: Secondary | ICD-10-CM | POA: Diagnosis not present

## 2022-02-20 DIAGNOSIS — M25631 Stiffness of right wrist, not elsewhere classified: Secondary | ICD-10-CM | POA: Diagnosis not present

## 2022-02-20 DIAGNOSIS — M25441 Effusion, right hand: Secondary | ICD-10-CM | POA: Diagnosis not present

## 2022-02-20 DIAGNOSIS — R531 Weakness: Secondary | ICD-10-CM | POA: Diagnosis not present

## 2022-02-20 DIAGNOSIS — M25641 Stiffness of right hand, not elsewhere classified: Secondary | ICD-10-CM | POA: Diagnosis not present

## 2022-02-22 DIAGNOSIS — M25641 Stiffness of right hand, not elsewhere classified: Secondary | ICD-10-CM | POA: Diagnosis not present

## 2022-02-22 DIAGNOSIS — R531 Weakness: Secondary | ICD-10-CM | POA: Diagnosis not present

## 2022-02-22 DIAGNOSIS — M25441 Effusion, right hand: Secondary | ICD-10-CM | POA: Diagnosis not present

## 2022-02-22 DIAGNOSIS — M25631 Stiffness of right wrist, not elsewhere classified: Secondary | ICD-10-CM | POA: Diagnosis not present

## 2022-02-26 DIAGNOSIS — M25631 Stiffness of right wrist, not elsewhere classified: Secondary | ICD-10-CM | POA: Diagnosis not present

## 2022-02-26 DIAGNOSIS — R531 Weakness: Secondary | ICD-10-CM | POA: Diagnosis not present

## 2022-02-26 DIAGNOSIS — M25641 Stiffness of right hand, not elsewhere classified: Secondary | ICD-10-CM | POA: Diagnosis not present

## 2022-02-26 DIAGNOSIS — M25441 Effusion, right hand: Secondary | ICD-10-CM | POA: Diagnosis not present

## 2022-02-27 DIAGNOSIS — S52501D Unspecified fracture of the lower end of right radius, subsequent encounter for closed fracture with routine healing: Secondary | ICD-10-CM | POA: Diagnosis not present

## 2022-03-01 DIAGNOSIS — M25441 Effusion, right hand: Secondary | ICD-10-CM | POA: Diagnosis not present

## 2022-03-01 DIAGNOSIS — M25641 Stiffness of right hand, not elsewhere classified: Secondary | ICD-10-CM | POA: Diagnosis not present

## 2022-03-01 DIAGNOSIS — M25631 Stiffness of right wrist, not elsewhere classified: Secondary | ICD-10-CM | POA: Diagnosis not present

## 2022-03-01 DIAGNOSIS — R531 Weakness: Secondary | ICD-10-CM | POA: Diagnosis not present

## 2022-03-04 DIAGNOSIS — M25641 Stiffness of right hand, not elsewhere classified: Secondary | ICD-10-CM | POA: Diagnosis not present

## 2022-03-04 DIAGNOSIS — M25631 Stiffness of right wrist, not elsewhere classified: Secondary | ICD-10-CM | POA: Diagnosis not present

## 2022-03-04 DIAGNOSIS — M25441 Effusion, right hand: Secondary | ICD-10-CM | POA: Diagnosis not present

## 2022-03-04 DIAGNOSIS — R531 Weakness: Secondary | ICD-10-CM | POA: Diagnosis not present

## 2022-03-08 DIAGNOSIS — R531 Weakness: Secondary | ICD-10-CM | POA: Diagnosis not present

## 2022-03-08 DIAGNOSIS — M25641 Stiffness of right hand, not elsewhere classified: Secondary | ICD-10-CM | POA: Diagnosis not present

## 2022-03-08 DIAGNOSIS — M25441 Effusion, right hand: Secondary | ICD-10-CM | POA: Diagnosis not present

## 2022-03-08 DIAGNOSIS — M25631 Stiffness of right wrist, not elsewhere classified: Secondary | ICD-10-CM | POA: Diagnosis not present

## 2022-03-12 DIAGNOSIS — M25441 Effusion, right hand: Secondary | ICD-10-CM | POA: Diagnosis not present

## 2022-03-12 DIAGNOSIS — M25631 Stiffness of right wrist, not elsewhere classified: Secondary | ICD-10-CM | POA: Diagnosis not present

## 2022-03-12 DIAGNOSIS — M25641 Stiffness of right hand, not elsewhere classified: Secondary | ICD-10-CM | POA: Diagnosis not present

## 2022-03-12 DIAGNOSIS — R531 Weakness: Secondary | ICD-10-CM | POA: Diagnosis not present

## 2022-03-15 DIAGNOSIS — M25441 Effusion, right hand: Secondary | ICD-10-CM | POA: Diagnosis not present

## 2022-03-15 DIAGNOSIS — M25641 Stiffness of right hand, not elsewhere classified: Secondary | ICD-10-CM | POA: Diagnosis not present

## 2022-03-15 DIAGNOSIS — M25631 Stiffness of right wrist, not elsewhere classified: Secondary | ICD-10-CM | POA: Diagnosis not present

## 2022-03-15 DIAGNOSIS — R531 Weakness: Secondary | ICD-10-CM | POA: Diagnosis not present

## 2022-03-20 DIAGNOSIS — R531 Weakness: Secondary | ICD-10-CM | POA: Diagnosis not present

## 2022-03-20 DIAGNOSIS — M25441 Effusion, right hand: Secondary | ICD-10-CM | POA: Diagnosis not present

## 2022-03-20 DIAGNOSIS — M25641 Stiffness of right hand, not elsewhere classified: Secondary | ICD-10-CM | POA: Diagnosis not present

## 2022-03-20 DIAGNOSIS — M25631 Stiffness of right wrist, not elsewhere classified: Secondary | ICD-10-CM | POA: Diagnosis not present

## 2022-03-26 DIAGNOSIS — M25631 Stiffness of right wrist, not elsewhere classified: Secondary | ICD-10-CM | POA: Diagnosis not present

## 2022-03-26 DIAGNOSIS — R531 Weakness: Secondary | ICD-10-CM | POA: Diagnosis not present

## 2022-03-26 DIAGNOSIS — M25441 Effusion, right hand: Secondary | ICD-10-CM | POA: Diagnosis not present

## 2022-03-26 DIAGNOSIS — M25641 Stiffness of right hand, not elsewhere classified: Secondary | ICD-10-CM | POA: Diagnosis not present

## 2022-03-29 DIAGNOSIS — R531 Weakness: Secondary | ICD-10-CM | POA: Diagnosis not present

## 2022-03-29 DIAGNOSIS — M25641 Stiffness of right hand, not elsewhere classified: Secondary | ICD-10-CM | POA: Diagnosis not present

## 2022-03-29 DIAGNOSIS — M25631 Stiffness of right wrist, not elsewhere classified: Secondary | ICD-10-CM | POA: Diagnosis not present

## 2022-03-29 DIAGNOSIS — M25441 Effusion, right hand: Secondary | ICD-10-CM | POA: Diagnosis not present

## 2022-04-02 DIAGNOSIS — M25441 Effusion, right hand: Secondary | ICD-10-CM | POA: Diagnosis not present

## 2022-04-02 DIAGNOSIS — R531 Weakness: Secondary | ICD-10-CM | POA: Diagnosis not present

## 2022-04-02 DIAGNOSIS — M25641 Stiffness of right hand, not elsewhere classified: Secondary | ICD-10-CM | POA: Diagnosis not present

## 2022-04-02 DIAGNOSIS — M25631 Stiffness of right wrist, not elsewhere classified: Secondary | ICD-10-CM | POA: Diagnosis not present

## 2022-04-03 DIAGNOSIS — S52501D Unspecified fracture of the lower end of right radius, subsequent encounter for closed fracture with routine healing: Secondary | ICD-10-CM | POA: Diagnosis not present

## 2022-04-04 DIAGNOSIS — M25641 Stiffness of right hand, not elsewhere classified: Secondary | ICD-10-CM | POA: Diagnosis not present

## 2022-04-04 DIAGNOSIS — M25631 Stiffness of right wrist, not elsewhere classified: Secondary | ICD-10-CM | POA: Diagnosis not present

## 2022-04-04 DIAGNOSIS — R531 Weakness: Secondary | ICD-10-CM | POA: Diagnosis not present

## 2022-04-04 DIAGNOSIS — M25441 Effusion, right hand: Secondary | ICD-10-CM | POA: Diagnosis not present

## 2022-04-09 DIAGNOSIS — M25441 Effusion, right hand: Secondary | ICD-10-CM | POA: Diagnosis not present

## 2022-04-09 DIAGNOSIS — M25641 Stiffness of right hand, not elsewhere classified: Secondary | ICD-10-CM | POA: Diagnosis not present

## 2022-04-09 DIAGNOSIS — R531 Weakness: Secondary | ICD-10-CM | POA: Diagnosis not present

## 2022-04-09 DIAGNOSIS — M25631 Stiffness of right wrist, not elsewhere classified: Secondary | ICD-10-CM | POA: Diagnosis not present

## 2022-04-16 ENCOUNTER — Ambulatory Visit: Payer: BC Managed Care – PPO | Admitting: Emergency Medicine

## 2022-04-18 ENCOUNTER — Other Ambulatory Visit: Payer: Self-pay | Admitting: Emergency Medicine

## 2022-04-18 DIAGNOSIS — I10 Essential (primary) hypertension: Secondary | ICD-10-CM

## 2022-05-06 ENCOUNTER — Other Ambulatory Visit: Payer: Self-pay | Admitting: Emergency Medicine

## 2022-05-06 DIAGNOSIS — E1129 Type 2 diabetes mellitus with other diabetic kidney complication: Secondary | ICD-10-CM

## 2022-05-06 DIAGNOSIS — E785 Hyperlipidemia, unspecified: Secondary | ICD-10-CM

## 2022-06-05 DIAGNOSIS — S52501D Unspecified fracture of the lower end of right radius, subsequent encounter for closed fracture with routine healing: Secondary | ICD-10-CM | POA: Diagnosis not present

## 2022-06-14 ENCOUNTER — Other Ambulatory Visit: Payer: Self-pay | Admitting: Emergency Medicine

## 2022-06-14 DIAGNOSIS — I152 Hypertension secondary to endocrine disorders: Secondary | ICD-10-CM

## 2022-06-18 DIAGNOSIS — G4733 Obstructive sleep apnea (adult) (pediatric): Secondary | ICD-10-CM | POA: Diagnosis not present

## 2022-06-24 ENCOUNTER — Ambulatory Visit (INDEPENDENT_AMBULATORY_CARE_PROVIDER_SITE_OTHER): Payer: BC Managed Care – PPO | Admitting: Emergency Medicine

## 2022-06-24 ENCOUNTER — Encounter: Payer: Self-pay | Admitting: Emergency Medicine

## 2022-06-24 VITALS — BP 124/78 | HR 64 | Temp 98.5°F | Ht 73.0 in | Wt 311.0 lb

## 2022-06-24 DIAGNOSIS — G4733 Obstructive sleep apnea (adult) (pediatric): Secondary | ICD-10-CM

## 2022-06-24 DIAGNOSIS — E1159 Type 2 diabetes mellitus with other circulatory complications: Secondary | ICD-10-CM

## 2022-06-24 DIAGNOSIS — K219 Gastro-esophageal reflux disease without esophagitis: Secondary | ICD-10-CM | POA: Diagnosis not present

## 2022-06-24 DIAGNOSIS — E785 Hyperlipidemia, unspecified: Secondary | ICD-10-CM | POA: Diagnosis not present

## 2022-06-24 DIAGNOSIS — Z23 Encounter for immunization: Secondary | ICD-10-CM | POA: Diagnosis not present

## 2022-06-24 DIAGNOSIS — I152 Hypertension secondary to endocrine disorders: Secondary | ICD-10-CM | POA: Diagnosis not present

## 2022-06-24 DIAGNOSIS — E1169 Type 2 diabetes mellitus with other specified complication: Secondary | ICD-10-CM

## 2022-06-24 DIAGNOSIS — Z1211 Encounter for screening for malignant neoplasm of colon: Secondary | ICD-10-CM

## 2022-06-24 LAB — LIPID PANEL
Cholesterol: 119 mg/dL (ref 0–200)
HDL: 32.8 mg/dL — ABNORMAL LOW (ref >39.00)
LDL Cholesterol: 53 mg/dL (ref 0–99)
NonHDL: 86.16
Total CHOL/HDL Ratio: 4
Triglycerides: 166 mg/dL — ABNORMAL HIGH (ref 0.0–149.0)
VLDL: 33.2 mg/dL (ref 0.0–40.0)

## 2022-06-24 LAB — COMPREHENSIVE METABOLIC PANEL
ALT: 18 U/L (ref 0–53)
AST: 13 U/L (ref 0–37)
Albumin: 4.2 g/dL (ref 3.5–5.2)
Alkaline Phosphatase: 70 U/L (ref 39–117)
BUN: 10 mg/dL (ref 6–23)
CO2: 31 mEq/L (ref 19–32)
Calcium: 8.8 mg/dL (ref 8.4–10.5)
Chloride: 103 mEq/L (ref 96–112)
Creatinine, Ser: 0.86 mg/dL (ref 0.40–1.50)
GFR: 92.57 mL/min (ref >60.00)
Glucose, Bld: 111 mg/dL — ABNORMAL HIGH (ref 70–99)
Potassium: 4.1 mEq/L (ref 3.5–5.1)
Sodium: 140 mEq/L (ref 135–145)
Total Bilirubin: 0.3 mg/dL (ref 0.2–1.2)
Total Protein: 7.2 g/dL (ref 6.0–8.3)

## 2022-06-24 LAB — POCT GLYCOSYLATED HEMOGLOBIN (HGB A1C): Hemoglobin A1C: 6.3 % — AB (ref 4.0–5.6)

## 2022-06-24 NOTE — Patient Instructions (Signed)
Diabetes mellitus y nutricin, en adultos Diabetes Mellitus and Nutrition, Adult Si sufre de diabetes, o diabetes mellitus, es muy importante tener hbitos alimenticios saludables debido a que sus niveles de azcar en la sangre (glucosa) se ven afectados en gran medida por lo que come y bebe. Comer alimentos saludables en las cantidades correctas, aproximadamente a la misma hora todos los das, lo ayudar a: Controlar su glucemia. Disminuir el riesgo de sufrir una enfermedad cardaca. Mejorar la presin arterial. Alcanzar o mantener un peso saludable. Qu puede afectar mi plan de alimentacin? Todas las personas que sufren de diabetes son diferentes y cada una tiene necesidades diferentes en cuanto a un plan de alimentacin. El mdico puede recomendarle que trabaje con un nutricionista para elaborar el mejor plan para usted. Su plan de alimentacin puede variar segn factores como: Las caloras que necesita. Los medicamentos que toma. Su peso. Sus niveles de glucemia, presin arterial y colesterol. Su nivel de actividad. Otras afecciones que tenga, como enfermedades cardacas o renales. Cmo me afectan los carbohidratos? Los carbohidratos, o hidratos de carbono, afectan su nivel de glucemia ms que cualquier otro tipo de alimento. La ingesta de carbohidratos aumenta la cantidad de glucosa en la sangre. Es importante conocer la cantidad de carbohidratos que se pueden ingerir en cada comida sin correr ningn riesgo. Esto es diferente en cada persona. Su nutricionista puede ayudarlo a calcular la cantidad de carbohidratos que debe ingerir en cada comida y en cada refrigerio. Cmo me afecta el alcohol? El alcohol puede provocar una disminucin de la glucemia (hipoglucemia), especialmente si usa insulina o toma determinados medicamentos por va oral para la diabetes. La hipoglucemia es una afeccin potencialmente mortal. Los sntomas de la hipoglucemia, como somnolencia, mareos y confusin, son  similares a los sntomas de haber consumido demasiado alcohol. No beba alcohol si: Su mdico le indica no hacerlo. Est embarazada, puede estar embarazada o est tratando de quedar embarazada. Si bebe alcohol: Limite la cantidad que bebe a lo siguiente: De 0 a 1 medida por da para las mujeres. De 0 a 2 medidas por da para los hombres. Sepa cunta cantidad de alcohol hay en las bebidas que toma. En los [place], una medida equivale a una botella de cerveza de 12 oz (355 ml), un vaso de vino de 5 oz (148 ml) o un vaso de una bebida alcohlica de alta graduacin de 1 oz (44 ml). Mantngase hidratado bebiendo agua, refrescos dietticos o t helado sin azcar. Tenga en cuenta que los refrescos comunes, los jugos y otras bebidas para mezclar pueden contener mucha azcar y se deben contar como carbohidratos. Consejos para seguir este plan  Leer las etiquetas de los alimentos Comience por leer el tamao de la porcin en la etiqueta de Informacin nutricional de los alimentos envasados y las bebidas. La cantidad de caloras, carbohidratos, grasas y otros nutrientes detallados en la etiqueta se basan en una porcin del alimento. Muchos alimentos contienen ms de una porcin por envase. Verifique la cantidad total de gramos (g) de carbohidratos totales en una porcin. Verifique la cantidad de gramos de grasas saturadas y grasas trans en una porcin. Escoja alimentos que no contengan estas grasas o que su contenido de estas sea bajo. Verifique la cantidad de miligramos (mg) de sal (sodio) en una porcin. La mayora de las personas deben limitar la ingesta de sodio total a menos de 2300 mg por da. Siempre consulte la informacin nutricional de los alimentos etiquetados como "con bajo contenido de grasa" o "sin grasa".   Estos alimentos pueden tener un mayor contenido de azcar agregada o carbohidratos refinados, y deben evitarse. Hable con su nutricionista para identificar sus objetivos diarios en  cuanto a los nutrientes mencionados en la etiqueta. Al ir de compras Evite comprar alimentos procesados, enlatados o precocidos. Estos alimentos tienden a tener una mayor cantidad de grasa, sodio y azcar agregada. Compre en la zona exterior de la tienda de comestibles. Esta es la zona donde se encuentran con mayor frecuencia las frutas y las verduras frescas, los cereales a granel, las carnes frescas y los productos lcteos frescos. Al cocinar Use mtodos de coccin a baja temperatura, como hornear, en lugar de mtodos de coccin a alta temperatura, como frer en abundante aceite. Cocine con aceites saludables, como el aceite de oliva, canola o girasol. Evite cocinar con manteca, crema o carnes con alto contenido de grasa. Planificacin de las comidas Coma las comidas y los refrigerios regularmente, preferentemente a la misma hora todos los das. Evite pasar largos perodos de tiempo sin comer. Consuma alimentos ricos en fibra, como frutas frescas, verduras, frijoles y cereales integrales. Consuma entre 4 y 6 onzas (entre 112 y 168 g) de protenas magras por da, como carnes magras, pollo, pescado, huevos o tofu. Una onza (oz) (28 g) de protena magra equivale a: 1 onza (28 g) de carne, pollo o pescado. 1 huevo.  taza (62 g) de tofu. Coma algunos alimentos por da que contengan grasas saludables, como aguacates, frutos secos, semillas y pescado. Qu alimentos debo comer? Frutas Bayas. Manzanas. Naranjas. Duraznos. Damascos. Ciruelas. Uvas. Mangos. Papayas. Granadas. Kiwi. Cerezas. Verduras Verduras de hoja verde, que incluyen lechuga, espinaca, col rizada, acelga, hojas de berza, hojas de mostaza y repollo. Remolachas. Coliflor. Brcoli. Zanahorias. Judas verdes. Tomates. Pimientos. Cebollas. Pepinos. Coles de Bruselas. Granos Granos integrales, como panes, galletas, tortillas, cereales y pastas de salvado o integrales. Avena sin azcar. Quinua. Arroz integral o salvaje. Carnes y otras  protenas Frutos de mar. Carne de ave sin piel. Cortes magros de ave y carne de res. Tofu. Frutos secos. Semillas. Lcteos Productos lcteos sin grasa o con bajo contenido de grasa, como leche, yogur y queso. Es posible que los productos detallados arriba no constituyan una lista completa de los alimentos y las bebidas que puede tomar. Consulte a un nutricionista para obtener ms informacin. Qu alimentos debo evitar? Frutas Frutas enlatadas al almbar. Verduras Verduras enlatadas. Verduras congeladas con mantequilla o salsa de crema. Granos Productos elaborados con harina y harina blanca refinada, como panes, pastas, bocadillos y cereales. Evite todos los alimentos procesados. Carnes y otras protenas Cortes de carne con alto contenido de grasa. Carne de ave con piel. Carnes empanizadas o fritas. Carne procesada. Evite las grasas saturadas. Lcteos Yogur, queso o leche enteros. Bebidas Bebidas azucaradas, como gaseosas o t helado. Es posible que los productos que se enumeran ms arriba no constituyan una lista completa de los alimentos y las bebidas que debe evitar. Consulte a un nutricionista para obtener ms informacin. Preguntas para hacerle al mdico Debo consultar con un especialista certificado en atencin y educacin sobre la diabetes? Es necesario que me rena con un nutricionista? A qu nmero puedo llamar si tengo preguntas? Cules son los mejores momentos para controlar la glucemia? Dnde encontrar ms informacin: American Diabetes Association (Asociacin Estadounidense de la Diabetes): diabetes.org Academy of Nutrition and Dietetics (Academia de Nutricin y Diettica): eatright.org National Institute of Diabetes and Digestive and Kidney Diseases (Instituto Nacional de la Diabetes y las Enfermedades Digestivas y Renales): niddk.nih.gov Association of Diabetes   Care & Education Specialists (Asociacin de Especialistas en Atencin y Educacin sobre la Diabetes):  diabeteseducator.org Resumen Es importante tener hbitos alimenticios saludables debido a que sus niveles de azcar en la sangre (glucosa) se ven afectados en gran medida por lo que come y bebe. Es importante consumir alcohol con prudencia. Un plan de comidas saludable lo ayudar a controlar la glucosa en sangre y a reducir el riesgo de enfermedades cardacas. El mdico puede recomendarle que trabaje con un nutricionista para elaborar el mejor plan para usted. Esta informacin no tiene como fin reemplazar el consejo del mdico. Asegrese de hacerle al mdico cualquier pregunta que tenga. Document Revised: 04/05/2020 Document Reviewed: 04/05/2020 Elsevier Patient Education  2023 Elsevier Inc.  

## 2022-06-24 NOTE — Assessment & Plan Note (Signed)
Well-controlled hypertension with normal blood pressure readings at home. Continue lisinopril 20 mg daily. Well-controlled diabetes with hemoglobin A1c of 6.3. Continue metformin 1000 mg twice a day and weekly Bydureon 2 mg. Cardiovascular risks associated with hypertension and diabetes discussed. Diet and nutrition discussed. Follow-up in 6 months.

## 2022-06-24 NOTE — Assessment & Plan Note (Signed)
Stable and asymptomatic.  No medications.
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
## 2022-06-24 NOTE — Assessment & Plan Note (Signed)
Well-controlled on CPAP treatment. 

## 2022-06-24 NOTE — Assessment & Plan Note (Signed)
Stable.  Diet and nutrition discussed. Continue simvastatin 40 mg daily. Lipid profile done today along with CMP

## 2022-06-25 LAB — MICROALBUMIN / CREATININE URINE RATIO
Creatinine,U: 249 mg/dL
Microalb Creat Ratio: 1.5 mg/g (ref 0.0–30.0)
Microalb, Ur: 3.6 mg/dL — ABNORMAL HIGH (ref 0.0–1.9)

## 2022-07-18 DIAGNOSIS — G4733 Obstructive sleep apnea (adult) (pediatric): Secondary | ICD-10-CM | POA: Diagnosis not present

## 2022-08-18 DIAGNOSIS — G4733 Obstructive sleep apnea (adult) (pediatric): Secondary | ICD-10-CM | POA: Diagnosis not present

## 2022-09-25 DIAGNOSIS — S52501D Unspecified fracture of the lower end of right radius, subsequent encounter for closed fracture with routine healing: Secondary | ICD-10-CM | POA: Diagnosis not present

## 2022-10-01 DIAGNOSIS — M25631 Stiffness of right wrist, not elsewhere classified: Secondary | ICD-10-CM | POA: Diagnosis not present

## 2022-10-01 DIAGNOSIS — M25641 Stiffness of right hand, not elsewhere classified: Secondary | ICD-10-CM | POA: Diagnosis not present

## 2022-10-01 DIAGNOSIS — M25531 Pain in right wrist: Secondary | ICD-10-CM | POA: Diagnosis not present

## 2022-10-01 DIAGNOSIS — M79641 Pain in right hand: Secondary | ICD-10-CM | POA: Diagnosis not present

## 2022-10-02 DIAGNOSIS — M25641 Stiffness of right hand, not elsewhere classified: Secondary | ICD-10-CM | POA: Diagnosis not present

## 2022-10-02 DIAGNOSIS — M25531 Pain in right wrist: Secondary | ICD-10-CM | POA: Diagnosis not present

## 2022-10-02 DIAGNOSIS — M25631 Stiffness of right wrist, not elsewhere classified: Secondary | ICD-10-CM | POA: Diagnosis not present

## 2022-10-02 DIAGNOSIS — M79641 Pain in right hand: Secondary | ICD-10-CM | POA: Diagnosis not present

## 2022-10-07 DIAGNOSIS — M79641 Pain in right hand: Secondary | ICD-10-CM | POA: Diagnosis not present

## 2022-10-07 DIAGNOSIS — M25631 Stiffness of right wrist, not elsewhere classified: Secondary | ICD-10-CM | POA: Diagnosis not present

## 2022-10-07 DIAGNOSIS — M25531 Pain in right wrist: Secondary | ICD-10-CM | POA: Diagnosis not present

## 2022-10-07 DIAGNOSIS — M25641 Stiffness of right hand, not elsewhere classified: Secondary | ICD-10-CM | POA: Diagnosis not present

## 2022-10-09 DIAGNOSIS — M25641 Stiffness of right hand, not elsewhere classified: Secondary | ICD-10-CM | POA: Diagnosis not present

## 2022-10-09 DIAGNOSIS — M25631 Stiffness of right wrist, not elsewhere classified: Secondary | ICD-10-CM | POA: Diagnosis not present

## 2022-10-09 DIAGNOSIS — M25531 Pain in right wrist: Secondary | ICD-10-CM | POA: Diagnosis not present

## 2022-10-09 DIAGNOSIS — M79641 Pain in right hand: Secondary | ICD-10-CM | POA: Diagnosis not present

## 2022-10-15 DIAGNOSIS — M25631 Stiffness of right wrist, not elsewhere classified: Secondary | ICD-10-CM | POA: Diagnosis not present

## 2022-10-15 DIAGNOSIS — M79641 Pain in right hand: Secondary | ICD-10-CM | POA: Diagnosis not present

## 2022-10-15 DIAGNOSIS — M25531 Pain in right wrist: Secondary | ICD-10-CM | POA: Diagnosis not present

## 2022-10-15 DIAGNOSIS — M25641 Stiffness of right hand, not elsewhere classified: Secondary | ICD-10-CM | POA: Diagnosis not present

## 2022-10-17 DIAGNOSIS — M25641 Stiffness of right hand, not elsewhere classified: Secondary | ICD-10-CM | POA: Diagnosis not present

## 2022-10-17 DIAGNOSIS — M79641 Pain in right hand: Secondary | ICD-10-CM | POA: Diagnosis not present

## 2022-10-17 DIAGNOSIS — M25631 Stiffness of right wrist, not elsewhere classified: Secondary | ICD-10-CM | POA: Diagnosis not present

## 2022-10-17 DIAGNOSIS — M25531 Pain in right wrist: Secondary | ICD-10-CM | POA: Diagnosis not present

## 2022-10-21 DIAGNOSIS — M79641 Pain in right hand: Secondary | ICD-10-CM | POA: Diagnosis not present

## 2022-10-21 DIAGNOSIS — M25641 Stiffness of right hand, not elsewhere classified: Secondary | ICD-10-CM | POA: Diagnosis not present

## 2022-10-21 DIAGNOSIS — M25531 Pain in right wrist: Secondary | ICD-10-CM | POA: Diagnosis not present

## 2022-10-21 DIAGNOSIS — M25631 Stiffness of right wrist, not elsewhere classified: Secondary | ICD-10-CM | POA: Diagnosis not present

## 2022-10-23 DIAGNOSIS — M25631 Stiffness of right wrist, not elsewhere classified: Secondary | ICD-10-CM | POA: Diagnosis not present

## 2022-10-23 DIAGNOSIS — M79641 Pain in right hand: Secondary | ICD-10-CM | POA: Diagnosis not present

## 2022-10-23 DIAGNOSIS — M25531 Pain in right wrist: Secondary | ICD-10-CM | POA: Diagnosis not present

## 2022-10-23 DIAGNOSIS — M25641 Stiffness of right hand, not elsewhere classified: Secondary | ICD-10-CM | POA: Diagnosis not present

## 2022-10-28 DIAGNOSIS — M25531 Pain in right wrist: Secondary | ICD-10-CM | POA: Diagnosis not present

## 2022-10-28 DIAGNOSIS — M79641 Pain in right hand: Secondary | ICD-10-CM | POA: Diagnosis not present

## 2022-10-28 DIAGNOSIS — M25631 Stiffness of right wrist, not elsewhere classified: Secondary | ICD-10-CM | POA: Diagnosis not present

## 2022-10-28 DIAGNOSIS — M25641 Stiffness of right hand, not elsewhere classified: Secondary | ICD-10-CM | POA: Diagnosis not present

## 2022-10-30 DIAGNOSIS — M79641 Pain in right hand: Secondary | ICD-10-CM | POA: Diagnosis not present

## 2022-10-30 DIAGNOSIS — M25531 Pain in right wrist: Secondary | ICD-10-CM | POA: Diagnosis not present

## 2022-10-30 DIAGNOSIS — M25631 Stiffness of right wrist, not elsewhere classified: Secondary | ICD-10-CM | POA: Diagnosis not present

## 2022-10-30 DIAGNOSIS — M25641 Stiffness of right hand, not elsewhere classified: Secondary | ICD-10-CM | POA: Diagnosis not present

## 2022-11-05 DIAGNOSIS — M79641 Pain in right hand: Secondary | ICD-10-CM | POA: Diagnosis not present

## 2022-11-05 DIAGNOSIS — M25631 Stiffness of right wrist, not elsewhere classified: Secondary | ICD-10-CM | POA: Diagnosis not present

## 2022-11-05 DIAGNOSIS — M25531 Pain in right wrist: Secondary | ICD-10-CM | POA: Diagnosis not present

## 2022-11-05 DIAGNOSIS — M25641 Stiffness of right hand, not elsewhere classified: Secondary | ICD-10-CM | POA: Diagnosis not present

## 2022-11-06 DIAGNOSIS — S52501D Unspecified fracture of the lower end of right radius, subsequent encounter for closed fracture with routine healing: Secondary | ICD-10-CM | POA: Diagnosis not present

## 2022-11-07 DIAGNOSIS — M25531 Pain in right wrist: Secondary | ICD-10-CM | POA: Diagnosis not present

## 2022-11-07 DIAGNOSIS — M25641 Stiffness of right hand, not elsewhere classified: Secondary | ICD-10-CM | POA: Diagnosis not present

## 2022-11-07 DIAGNOSIS — M25631 Stiffness of right wrist, not elsewhere classified: Secondary | ICD-10-CM | POA: Diagnosis not present

## 2022-11-07 DIAGNOSIS — M79641 Pain in right hand: Secondary | ICD-10-CM | POA: Diagnosis not present

## 2022-11-11 DIAGNOSIS — M25641 Stiffness of right hand, not elsewhere classified: Secondary | ICD-10-CM | POA: Diagnosis not present

## 2022-11-11 DIAGNOSIS — M79641 Pain in right hand: Secondary | ICD-10-CM | POA: Diagnosis not present

## 2022-11-11 DIAGNOSIS — M25631 Stiffness of right wrist, not elsewhere classified: Secondary | ICD-10-CM | POA: Diagnosis not present

## 2022-11-11 DIAGNOSIS — M25531 Pain in right wrist: Secondary | ICD-10-CM | POA: Diagnosis not present

## 2022-11-13 DIAGNOSIS — M25641 Stiffness of right hand, not elsewhere classified: Secondary | ICD-10-CM | POA: Diagnosis not present

## 2022-11-13 DIAGNOSIS — M25631 Stiffness of right wrist, not elsewhere classified: Secondary | ICD-10-CM | POA: Diagnosis not present

## 2022-11-13 DIAGNOSIS — M79641 Pain in right hand: Secondary | ICD-10-CM | POA: Diagnosis not present

## 2022-11-13 DIAGNOSIS — M25531 Pain in right wrist: Secondary | ICD-10-CM | POA: Diagnosis not present

## 2022-11-18 DIAGNOSIS — M25631 Stiffness of right wrist, not elsewhere classified: Secondary | ICD-10-CM | POA: Diagnosis not present

## 2022-11-18 DIAGNOSIS — M79641 Pain in right hand: Secondary | ICD-10-CM | POA: Diagnosis not present

## 2022-11-18 DIAGNOSIS — M25531 Pain in right wrist: Secondary | ICD-10-CM | POA: Diagnosis not present

## 2022-11-18 DIAGNOSIS — M25641 Stiffness of right hand, not elsewhere classified: Secondary | ICD-10-CM | POA: Diagnosis not present

## 2022-11-21 DIAGNOSIS — M25641 Stiffness of right hand, not elsewhere classified: Secondary | ICD-10-CM | POA: Diagnosis not present

## 2022-11-21 DIAGNOSIS — M79641 Pain in right hand: Secondary | ICD-10-CM | POA: Diagnosis not present

## 2022-11-21 DIAGNOSIS — M25531 Pain in right wrist: Secondary | ICD-10-CM | POA: Diagnosis not present

## 2022-11-21 DIAGNOSIS — M25631 Stiffness of right wrist, not elsewhere classified: Secondary | ICD-10-CM | POA: Diagnosis not present

## 2022-11-26 DIAGNOSIS — M25641 Stiffness of right hand, not elsewhere classified: Secondary | ICD-10-CM | POA: Diagnosis not present

## 2022-11-26 DIAGNOSIS — M25531 Pain in right wrist: Secondary | ICD-10-CM | POA: Diagnosis not present

## 2022-11-26 DIAGNOSIS — M79641 Pain in right hand: Secondary | ICD-10-CM | POA: Diagnosis not present

## 2022-11-26 DIAGNOSIS — M25631 Stiffness of right wrist, not elsewhere classified: Secondary | ICD-10-CM | POA: Diagnosis not present

## 2022-12-03 DIAGNOSIS — M79641 Pain in right hand: Secondary | ICD-10-CM | POA: Diagnosis not present

## 2022-12-03 DIAGNOSIS — M25531 Pain in right wrist: Secondary | ICD-10-CM | POA: Diagnosis not present

## 2022-12-03 DIAGNOSIS — M25641 Stiffness of right hand, not elsewhere classified: Secondary | ICD-10-CM | POA: Diagnosis not present

## 2022-12-03 DIAGNOSIS — M25631 Stiffness of right wrist, not elsewhere classified: Secondary | ICD-10-CM | POA: Diagnosis not present

## 2022-12-05 DIAGNOSIS — M79641 Pain in right hand: Secondary | ICD-10-CM | POA: Diagnosis not present

## 2022-12-05 DIAGNOSIS — G5601 Carpal tunnel syndrome, right upper limb: Secondary | ICD-10-CM | POA: Diagnosis not present

## 2022-12-30 DIAGNOSIS — Z6841 Body Mass Index (BMI) 40.0 and over, adult: Secondary | ICD-10-CM | POA: Diagnosis not present

## 2022-12-30 DIAGNOSIS — G5601 Carpal tunnel syndrome, right upper limb: Secondary | ICD-10-CM | POA: Diagnosis not present

## 2023-01-01 DIAGNOSIS — S52501D Unspecified fracture of the lower end of right radius, subsequent encounter for closed fracture with routine healing: Secondary | ICD-10-CM | POA: Diagnosis not present

## 2023-02-17 ENCOUNTER — Encounter: Payer: Self-pay | Admitting: Emergency Medicine

## 2023-02-17 ENCOUNTER — Ambulatory Visit (INDEPENDENT_AMBULATORY_CARE_PROVIDER_SITE_OTHER): Payer: BC Managed Care – PPO | Admitting: Emergency Medicine

## 2023-02-17 VITALS — BP 138/84 | HR 94 | Temp 98.5°F | Ht 73.0 in | Wt 318.2 lb

## 2023-02-17 DIAGNOSIS — E1159 Type 2 diabetes mellitus with other circulatory complications: Secondary | ICD-10-CM

## 2023-02-17 DIAGNOSIS — Z1211 Encounter for screening for malignant neoplasm of colon: Secondary | ICD-10-CM

## 2023-02-17 DIAGNOSIS — E1169 Type 2 diabetes mellitus with other specified complication: Secondary | ICD-10-CM

## 2023-02-17 DIAGNOSIS — E785 Hyperlipidemia, unspecified: Secondary | ICD-10-CM | POA: Diagnosis not present

## 2023-02-17 DIAGNOSIS — I152 Hypertension secondary to endocrine disorders: Secondary | ICD-10-CM | POA: Diagnosis not present

## 2023-02-17 DIAGNOSIS — G4733 Obstructive sleep apnea (adult) (pediatric): Secondary | ICD-10-CM

## 2023-02-17 DIAGNOSIS — Z7985 Long-term (current) use of injectable non-insulin antidiabetic drugs: Secondary | ICD-10-CM

## 2023-02-17 LAB — POCT GLYCOSYLATED HEMOGLOBIN (HGB A1C): Hemoglobin A1C: 6.5 % — AB (ref 4.0–5.6)

## 2023-02-17 MED ORDER — TIRZEPATIDE 5 MG/0.5ML ~~LOC~~ SOAJ: 5.0000 mg | SUBCUTANEOUS | 3 refills | Status: DC
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
## 2023-02-17 NOTE — Assessment & Plan Note (Signed)
BP Readings from Last 3 Encounters:  02/17/23 138/84  06/24/22 124/78  11/02/21 121/77  Well-controlled hypertension Continue lisinopril 20 mg daily Well-controlled diabetes with hemoglobin A1c of 6.5 Has been off Bydureon.  Recommend to instead start Mounjaro 5 mg weekly and continue metformin 1000 mg twice a day Cardiovascular risks associated with hypertension and diabetes discussed Diet and nutrition discussed Will follow-up in 6 months

## 2023-02-17 NOTE — Assessment & Plan Note (Signed)
Stable on CPAP treatment. 

## 2023-02-17 NOTE — Assessment & Plan Note (Signed)
Stable chronic conditions Continue simvastatin 40 mg daily Nutrition discussed Lipid profile done today

## 2023-02-17 NOTE — Assessment & Plan Note (Signed)
Not losing weight and not exercising as he should Advised to decrease amount of daily carbohydrate intake and daily calories and increase amount of plant-based protein in his diet.

## 2023-02-17 NOTE — Patient Instructions (Signed)
Diabetes mellitus y nutricin, en adultos Diabetes Mellitus and Nutrition, Adult Si sufre de diabetes, o diabetes mellitus, es muy importante tener hbitos alimenticios saludables debido a que sus niveles de azcar en la sangre (glucosa) se ven afectados en gran medida por lo que come y bebe. Comer alimentos saludables en las cantidades correctas, aproximadamente a la misma hora todos los das, lo ayudar a: Controlar su glucemia. Disminuir el riesgo de sufrir una enfermedad cardaca. Mejorar la presin arterial. Alcanzar o mantener un peso saludable. Qu puede afectar mi plan de alimentacin? Todas las personas que sufren de diabetes son diferentes y cada una tiene necesidades diferentes en cuanto a un plan de alimentacin. El mdico puede recomendarle que trabaje con un nutricionista para elaborar el mejor plan para usted. Su plan de alimentacin puede variar segn factores como: Las caloras que necesita. Los medicamentos que toma. Su peso. Sus niveles de glucemia, presin arterial y colesterol. Su nivel de actividad. Otras afecciones que tenga, como enfermedades cardacas o renales. Cmo me afectan los carbohidratos? Los carbohidratos, o hidratos de carbono, afectan su nivel de glucemia ms que cualquier otro tipo de alimento. La ingesta de carbohidratos aumenta la cantidad de glucosa en la sangre. Es importante conocer la cantidad de carbohidratos que se pueden ingerir en cada comida sin correr ningn riesgo. Esto es diferente en cada persona. Su nutricionista puede ayudarlo a calcular la cantidad de carbohidratos que debe ingerir en cada comida y en cada refrigerio. Cmo me afecta el alcohol? El alcohol puede provocar una disminucin de la glucemia (hipoglucemia), especialmente si usa insulina o toma determinados medicamentos por va oral para la diabetes. La hipoglucemia es una afeccin potencialmente mortal. Los sntomas de la hipoglucemia, como somnolencia, mareos y confusin, son  similares a los sntomas de haber consumido demasiado alcohol. No beba alcohol si: Su mdico le indica no hacerlo. Est embarazada, puede estar embarazada o est tratando de quedar embarazada. Si bebe alcohol: Limite la cantidad que bebe a lo siguiente: De 0 a 1 medida por da para las mujeres. De 0 a 2 medidas por da para los hombres. Sepa cunta cantidad de alcohol hay en las bebidas que toma. En los [place], una medida equivale a una botella de cerveza de 12 oz (355 ml), un vaso de vino de 5 oz (148 ml) o un vaso de una bebida alcohlica de alta graduacin de 1 oz (44 ml). Mantngase hidratado bebiendo agua, refrescos dietticos o t helado sin azcar. Tenga en cuenta que los refrescos comunes, los jugos y otras bebidas para mezclar pueden contener mucha azcar y se deben contar como carbohidratos. Consejos para seguir este plan  Leer las etiquetas de los alimentos Comience por leer el tamao de la porcin en la etiqueta de Informacin nutricional de los alimentos envasados y las bebidas. La cantidad de caloras, carbohidratos, grasas y otros nutrientes detallados en la etiqueta se basan en una porcin del alimento. Muchos alimentos contienen ms de una porcin por envase. Verifique la cantidad total de gramos (g) de carbohidratos totales en una porcin. Verifique la cantidad de gramos de grasas saturadas y grasas trans en una porcin. Escoja alimentos que no contengan estas grasas o que su contenido de estas sea bajo. Verifique la cantidad de miligramos (mg) de sal (sodio) en una porcin. La mayora de las personas deben limitar la ingesta de sodio total a menos de 2300 mg por da. Siempre consulte la informacin nutricional de los alimentos etiquetados como "con bajo contenido de grasa" o "sin grasa".   Estos alimentos pueden tener un mayor contenido de azcar agregada o carbohidratos refinados, y deben evitarse. Hable con su nutricionista para identificar sus objetivos diarios en  cuanto a los nutrientes mencionados en la etiqueta. Al ir de compras Evite comprar alimentos procesados, enlatados o precocidos. Estos alimentos tienden a tener una mayor cantidad de grasa, sodio y azcar agregada. Compre en la zona exterior de la tienda de comestibles. Esta es la zona donde se encuentran con mayor frecuencia las frutas y las verduras frescas, los cereales a granel, las carnes frescas y los productos lcteos frescos. Al cocinar Use mtodos de coccin a baja temperatura, como hornear, en lugar de mtodos de coccin a alta temperatura, como frer en abundante aceite. Cocine con aceites saludables, como el aceite de oliva, canola o girasol. Evite cocinar con manteca, crema o carnes con alto contenido de grasa. Planificacin de las comidas Coma las comidas y los refrigerios regularmente, preferentemente a la misma hora todos los das. Evite pasar largos perodos de tiempo sin comer. Consuma alimentos ricos en fibra, como frutas frescas, verduras, frijoles y cereales integrales. Consuma entre 4 y 6 onzas (entre 112 y 168 g) de protenas magras por da, como carnes magras, pollo, pescado, huevos o tofu. Una onza (oz) (28 g) de protena magra equivale a: 1 onza (28 g) de carne, pollo o pescado. 1 huevo.  taza (62 g) de tofu. Coma algunos alimentos por da que contengan grasas saludables, como aguacates, frutos secos, semillas y pescado. Qu alimentos debo comer? Frutas Bayas. Manzanas. Naranjas. Duraznos. Damascos. Ciruelas. Uvas. Mangos. Papayas. Granadas. Kiwi. Cerezas. Verduras Verduras de hoja verde, que incluyen lechuga, espinaca, col rizada, acelga, hojas de berza, hojas de mostaza y repollo. Remolachas. Coliflor. Brcoli. Zanahorias. Judas verdes. Tomates. Pimientos. Cebollas. Pepinos. Coles de Bruselas. Granos Granos integrales, como panes, galletas, tortillas, cereales y pastas de salvado o integrales. Avena sin azcar. Quinua. Arroz integral o salvaje. Carnes y otras  protenas Frutos de mar. Carne de ave sin piel. Cortes magros de ave y carne de res. Tofu. Frutos secos. Semillas. Lcteos Productos lcteos sin grasa o con bajo contenido de grasa, como leche, yogur y queso. Es posible que los productos detallados arriba no constituyan una lista completa de los alimentos y las bebidas que puede tomar. Consulte a un nutricionista para obtener ms informacin. Qu alimentos debo evitar? Frutas Frutas enlatadas al almbar. Verduras Verduras enlatadas. Verduras congeladas con mantequilla o salsa de crema. Granos Productos elaborados con harina y harina blanca refinada, como panes, pastas, bocadillos y cereales. Evite todos los alimentos procesados. Carnes y otras protenas Cortes de carne con alto contenido de grasa. Carne de ave con piel. Carnes empanizadas o fritas. Carne procesada. Evite las grasas saturadas. Lcteos Yogur, queso o leche enteros. Bebidas Bebidas azucaradas, como gaseosas o t helado. Es posible que los productos que se enumeran ms arriba no constituyan una lista completa de los alimentos y las bebidas que debe evitar. Consulte a un nutricionista para obtener ms informacin. Preguntas para hacerle al mdico Debo consultar con un especialista certificado en atencin y educacin sobre la diabetes? Es necesario que me rena con un nutricionista? A qu nmero puedo llamar si tengo preguntas? Cules son los mejores momentos para controlar la glucemia? Dnde encontrar ms informacin: American Diabetes Association (Asociacin Estadounidense de la Diabetes): diabetes.org Academy of Nutrition and Dietetics (Academia de Nutricin y Diettica): eatright.org National Institute of Diabetes and Digestive and Kidney Diseases (Instituto Nacional de la Diabetes y las Enfermedades Digestivas y Renales): niddk.nih.gov Association of Diabetes   Care & Education Specialists (Asociacin de Especialistas en Atencin y Educacin sobre la Diabetes):  diabeteseducator.org Resumen Es importante tener hbitos alimenticios saludables debido a que sus niveles de azcar en la sangre (glucosa) se ven afectados en gran medida por lo que come y bebe. Es importante consumir alcohol con prudencia. Un plan de comidas saludable lo ayudar a controlar la glucosa en sangre y a reducir el riesgo de enfermedades cardacas. El mdico puede recomendarle que trabaje con un nutricionista para elaborar el mejor plan para usted. Esta informacin no tiene como fin reemplazar el consejo del mdico. Asegrese de hacerle al mdico cualquier pregunta que tenga. Document Revised: 04/05/2020 Document Reviewed: 04/05/2020 Elsevier Patient Education  2024 Elsevier Inc.  

## 2023-02-18 LAB — CBC WITH DIFFERENTIAL/PLATELET
Basophils Absolute: 0.1 10*3/uL (ref 0.0–0.1)
Basophils Relative: 0.8 % (ref 0.0–3.0)
Eosinophils Absolute: 0.2 10*3/uL (ref 0.0–0.7)
Eosinophils Relative: 2 % (ref 0.0–5.0)
HCT: 45.5 % (ref 39.0–52.0)
Hemoglobin: 14.8 g/dL (ref 13.0–17.0)
Lymphocytes Relative: 29.1 % (ref 12.0–46.0)
Lymphs Abs: 3.3 10*3/uL (ref 0.7–4.0)
MCHC: 32.4 g/dL (ref 30.0–36.0)
MCV: 84.7 fl (ref 78.0–100.0)
Monocytes Absolute: 1 10*3/uL (ref 0.1–1.0)
Monocytes Relative: 8.8 % (ref 3.0–12.0)
Neutro Abs: 6.8 10*3/uL (ref 1.4–7.7)
Neutrophils Relative %: 59.3 % (ref 43.0–77.0)
Platelets: 271 10*3/uL (ref 150.0–400.0)
RBC: 5.38 Mil/uL (ref 4.22–5.81)
RDW: 14.7 % (ref 11.5–15.5)
WBC: 11.4 10*3/uL — ABNORMAL HIGH (ref 4.0–10.5)

## 2023-02-18 LAB — URINALYSIS
Bilirubin Urine: NEGATIVE
Hgb urine dipstick: NEGATIVE
Ketones, ur: NEGATIVE
Leukocytes,Ua: NEGATIVE
Nitrite: NEGATIVE
Specific Gravity, Urine: 1.025 (ref 1.000–1.030)
Total Protein, Urine: NEGATIVE
Urine Glucose: NEGATIVE
Urobilinogen, UA: 0.2 (ref 0.0–1.0)
pH: 6 (ref 5.0–8.0)

## 2023-02-18 LAB — COMPREHENSIVE METABOLIC PANEL
ALT: 28 U/L (ref 0–53)
AST: 16 U/L (ref 0–37)
Albumin: 4.1 g/dL (ref 3.5–5.2)
Alkaline Phosphatase: 73 U/L (ref 39–117)
BUN: 11 mg/dL (ref 6–23)
CO2: 30 mEq/L (ref 19–32)
Calcium: 9.6 mg/dL (ref 8.4–10.5)
Chloride: 105 mEq/L (ref 96–112)
Creatinine, Ser: 1.09 mg/dL (ref 0.40–1.50)
GFR: 72.23 mL/min (ref >60.00)
Glucose, Bld: 105 mg/dL — ABNORMAL HIGH (ref 70–99)
Potassium: 5 mEq/L (ref 3.5–5.1)
Sodium: 141 mEq/L (ref 135–145)
Total Bilirubin: 0.3 mg/dL (ref 0.2–1.2)
Total Protein: 7 g/dL (ref 6.0–8.3)

## 2023-02-18 LAB — LIPID PANEL
Cholesterol: 118 mg/dL (ref 0–200)
HDL: 32.4 mg/dL — ABNORMAL LOW (ref >39.00)
LDL Cholesterol: 62 mg/dL (ref 0–99)
NonHDL: 85.78
Total CHOL/HDL Ratio: 4
Triglycerides: 120 mg/dL (ref 0.0–149.0)
VLDL: 24 mg/dL (ref 0.0–40.0)

## 2023-02-18 LAB — MICROALBUMIN / CREATININE URINE RATIO
Creatinine,U: 136.1 mg/dL
Microalb Creat Ratio: 0.8 mg/g (ref 0.0–30.0)
Microalb, Ur: 1.1 mg/dL (ref 0.0–1.9)

## 2023-04-07 DIAGNOSIS — Z6841 Body Mass Index (BMI) 40.0 and over, adult: Secondary | ICD-10-CM | POA: Diagnosis not present

## 2023-04-07 DIAGNOSIS — G5601 Carpal tunnel syndrome, right upper limb: Secondary | ICD-10-CM | POA: Diagnosis not present

## 2023-04-14 ENCOUNTER — Other Ambulatory Visit: Payer: Self-pay | Admitting: Emergency Medicine

## 2023-04-14 DIAGNOSIS — I10 Essential (primary) hypertension: Secondary | ICD-10-CM

## 2023-04-19 DIAGNOSIS — E119 Type 2 diabetes mellitus without complications: Secondary | ICD-10-CM | POA: Diagnosis not present

## 2023-04-19 LAB — HM DIABETES EYE EXAM

## 2023-05-01 ENCOUNTER — Other Ambulatory Visit: Payer: Self-pay | Admitting: Emergency Medicine

## 2023-05-01 DIAGNOSIS — E1129 Type 2 diabetes mellitus with other diabetic kidney complication: Secondary | ICD-10-CM

## 2023-05-01 DIAGNOSIS — E785 Hyperlipidemia, unspecified: Secondary | ICD-10-CM

## 2023-07-15 ENCOUNTER — Ambulatory Visit (HOSPITAL_COMMUNITY)
Admission: EM | Admit: 2023-07-15 | Discharge: 2023-07-15 | Disposition: A | Discharge status: Home / Self Care | Payer: BC Managed Care – PPO | Attending: Emergency Medicine | Admitting: Emergency Medicine

## 2023-07-15 ENCOUNTER — Encounter (HOSPITAL_COMMUNITY): Payer: Self-pay

## 2023-07-15 DIAGNOSIS — S39012A Strain of muscle, fascia and tendon of lower back, initial encounter: Secondary | ICD-10-CM | POA: Diagnosis not present

## 2023-07-15 DIAGNOSIS — J209 Acute bronchitis, unspecified: Secondary | ICD-10-CM | POA: Diagnosis not present

## 2023-07-15 DIAGNOSIS — J4521 Mild intermittent asthma with (acute) exacerbation: Secondary | ICD-10-CM

## 2023-07-15 MED ORDER — BUDESONIDE-FORMOTEROL FUMARATE 160-4.5 MCG/ACT IN AERO: 2.0000 | INHALATION_SPRAY | Freq: Two times a day (BID) | RESPIRATORY_TRACT | 12 refills | Status: AC

## 2023-07-15 MED ORDER — PREDNISONE 20 MG PO TABS: 20.0000 mg | ORAL_TABLET | Freq: Every day | ORAL | 0 refills | Status: AC

## 2023-07-15 MED ORDER — AZITHROMYCIN 250 MG PO TABS: 250.0000 mg | ORAL_TABLET | Freq: Every day | ORAL | 0 refills | Status: DC

## 2023-07-15 NOTE — Discharge Instructions (Signed)
Antibiotics until complete Prednisone x 5 days.  This will help with cough and back pain Inhaler Daily
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
## 2023-07-15 NOTE — ED Triage Notes (Signed)
Pt c/o having the flu 3 wks ago and still having a cough. States now having pain to bilateral sides/lower back. Taking Nyquil with no relief.

## 2023-07-26 ENCOUNTER — Encounter (HOSPITAL_COMMUNITY): Payer: Self-pay | Admitting: Emergency Medicine

## 2023-08-20 ENCOUNTER — Ambulatory Visit (INDEPENDENT_AMBULATORY_CARE_PROVIDER_SITE_OTHER): Payer: BC Managed Care – PPO | Admitting: Emergency Medicine

## 2023-08-20 ENCOUNTER — Encounter: Payer: Self-pay | Admitting: Emergency Medicine

## 2023-08-20 VITALS — BP 148/98 | HR 86 | Temp 98.5°F | Ht 73.0 in | Wt 312.0 lb

## 2023-08-20 DIAGNOSIS — E1159 Type 2 diabetes mellitus with other circulatory complications: Secondary | ICD-10-CM | POA: Diagnosis not present

## 2023-08-20 DIAGNOSIS — E1169 Type 2 diabetes mellitus with other specified complication: Secondary | ICD-10-CM

## 2023-08-20 DIAGNOSIS — G4733 Obstructive sleep apnea (adult) (pediatric): Secondary | ICD-10-CM | POA: Diagnosis not present

## 2023-08-20 DIAGNOSIS — I152 Hypertension secondary to endocrine disorders: Secondary | ICD-10-CM | POA: Diagnosis not present

## 2023-08-20 DIAGNOSIS — I493 Ventricular premature depolarization: Secondary | ICD-10-CM | POA: Diagnosis not present

## 2023-08-20 DIAGNOSIS — E785 Hyperlipidemia, unspecified: Secondary | ICD-10-CM | POA: Diagnosis not present

## 2023-08-20 LAB — POCT GLYCOSYLATED HEMOGLOBIN (HGB A1C): Hemoglobin A1C: 6.3 % — AB (ref 4.0–5.6)

## 2023-08-20 MED ORDER — FREESTYLE LIBRE 3 SENSOR MISC: 5 refills | Status: AC

## 2023-08-20 NOTE — Assessment & Plan Note (Signed)
 Incidental finding on physical examination and EKG Normal sinus rhythm with no acute ischemic changes on EKG Asymptomatic

## 2023-08-20 NOTE — Assessment & Plan Note (Signed)
 Stable on CPAP treatment.  Requesting new machine. Needs new referral to sleep clinic Referral placed today

## 2023-08-20 NOTE — Assessment & Plan Note (Signed)
 Stable chronic conditions Continue simvastatin 40 mg daily Nutrition discussed

## 2023-08-20 NOTE — Patient Instructions (Signed)
 Diabetes mellitus y nutricin, en adultos Diabetes Mellitus and Nutrition, Adult Si sufre de diabetes, o diabetes mellitus, es muy importante tener hbitos alimenticios saludables debido a que sus niveles de Psychologist, counselling sangre (glucosa) se ven afectados en gran medida por lo que come y bebe. [place] alimentos saludables en las cantidades correctas, aproximadamente a la misma hora todos los [place], [place] ayudar a: Chief Operating Officer su glucemia. Disminuir el riesgo de sufrir una enfermedad cardaca. Mejorar la presin arterial. Barista o mantener un peso saludable. Qu puede afectar mi plan de alimentacin? Todas las personas que sufren de diabetes son diferentes y cada una tiene necesidades diferentes en cuanto a un plan de alimentacin. El mdico puede recomendarle que trabaje con un nutricionista para elaborar el mejor plan para usted. Su plan de alimentacin puede variar segn factores como: Las caloras que necesita. Los medicamentos que toma. Su peso. Sus niveles de glucemia, presin arterial y colesterol. Su nivel de [place]. Otras afecciones que tenga, como enfermedades cardacas o renales. Cmo me afectan los carbohidratos? Los carbohidratos, o hidratos de carbono, afectan su nivel de glucemia ms que cualquier otro tipo de alimento. La ingesta de carbohidratos aumenta la cantidad de [employer]. Es importante conocer la cantidad de carbohidratos que se pueden ingerir en cada comida sin correr Surveyor, minerals. Esto es Government social research officer. Su nutricionista puede ayudarlo a calcular la cantidad de carbohidratos que debe ingerir en cada comida y en cada refrigerio. Cmo me afecta el alcohol? El alcohol puede provocar una disminucin de la glucemia (hipoglucemia), especialmente si [place] insulina o toma determinados medicamentos por va oral para la diabetes. La hipoglucemia es una afeccin potencialmente mortal. Los sntomas de la hipoglucemia, como somnolencia, mareos y confusin, son  similares a los sntomas de haber consumido demasiado alcohol. No beba alcohol si: Su mdico le indica no hacerlo. Est embarazada, puede estar embarazada o est tratando de [place]. Si bebe alcohol: Limite la cantidad que bebe a lo siguiente: De 0 a 1 medida por da para las mujeres. De 0 a 2 medidas por da para los hombres. Sepa cunta cantidad de alcohol hay en las bebidas que toma. En los [address], una medida equivale a una botella de cerveza de 12 oz (355 ml), un vaso de vino de 5 oz (148 ml) o un vaso de una bebida alcohlica de alta graduacin de 1 oz (44 ml). Mantngase hidratado bebiendo agua, refrescos dietticos o t helado sin azcar. Tenga en cuenta que los refrescos comunes, los jugos y otras bebidas para mezclar pueden contener Product/process development scientist y se deben contar como carbohidratos. Consejos para seguir Social worker las etiquetas de los alimentos Comience por leer el tamao de la porcin en la etiqueta de Informacin nutricional de los alimentos envasados y las bebidas. La cantidad de caloras, carbohidratos, grasas y otros nutrientes detallados en la etiqueta se basan en una porcin del alimento. Muchos alimentos contienen ms de una porcin por envase. Verifique la cantidad total de gramos (g) de carbohidratos totales en una porcin. Verifique la cantidad de gramos de grasas saturadas y grasas trans en una porcin. Escoja alimentos que no contengan estas grasas o que su contenido de estas sea [place]. Verifique la cantidad de miligramos (mg) de sal (sodio) en una porcin. La [employer] de las personas deben limitar la ingesta de sodio total a menos de 2300 mg [employer]. Siempre consulte la informacin nutricional de los alimentos etiquetados como "con bajo contenido de grasa" o "sin grasa".  Estos alimentos pueden tener un mayor contenido de International aid/development worker agregada o carbohidratos refinados, y deben evitarse. Hable con su nutricionista para identificar sus objetivos diarios en  cuanto a los nutrientes mencionados en la etiqueta. Al ir de compras Evite comprar alimentos procesados, enlatados o precocidos. Estos alimentos tienden a Counselling psychologist mayor cantidad de [place], sodio y azcar agregada. Compre en la zona exterior de la tienda de comestibles. Esta es la zona donde se encuentran con mayor frecuencia las frutas y las verduras frescas, los cereales a granel, las carnes frescas y los productos lcteos frescos. Al cocinar Use mtodos de coccin a baja temperatura, como hornear, en lugar de mtodos de coccin a alta temperatura, como frer en abundante aceite. Cocine con aceites saludables, como el aceite de [place], canola o [place]. Evite cocinar con manteca, crema o carnes con alto contenido de grasa. Planificacin de las comidas Coma las comidas y los refrigerios regularmente, preferentemente a la misma hora todos [place]. Evite pasar largos perodos de tiempo sin comer. Consuma alimentos ricos en fibra, como frutas frescas, verduras, frijoles y cereales integrales. Consuma entre 4 y 6 onzas (entre 112 y 168 g) de protenas magras por da, como carnes [place], pollo, pescado, huevos o tofu. Una onza (oz) (28 g) de protena magra equivale a: 1 onza (28 g) de carne, pollo o pescado. 1 huevo.  taza (62 g) de tofu. Coma algunos alimentos por da que contengan grasas saludables, como aguacates, frutos secos, semillas y pescado. Qu alimentos debo comer? Nils Pyle Bayas. Manzanas. Naranjas. Duraznos. Damascos. Ciruelas. Uvas. Mangos. Papayas. Granadas. Kiwi. Cerezas. Verduras Verduras de [employer], que incluyen [place], [place], col rizada, acelga, hojas de berza, hojas de mostaza y repollo. Remolachas. Coliflor. Brcoli. Zanahorias. Judas verdes. Tomates. Pimientos. Cebollas. Pepinos. Coles de Bruselas. Granos Granos integrales, como panes, galletas, tortillas, cereales y pastas de salvado o integrales. Avena sin azcar. Quinua. Arroz integral o salvaje. Carnes y otras  protenas Frutos de mar. Carne de ave sin piel. Cortes magros de ave y carne de res. Tofu. Frutos secos. Semillas. Lcteos Productos lcteos sin grasa o con bajo contenido de [place], [place], yogur y [place]. Es posible que los productos detallados arriba no constituyan una lista completa de los alimentos y las bebidas que puede tomar. Consulte a un nutricionista para obtener ms informacin. Qu alimentos debo evitar? Nils Pyle Frutas enlatadas al almbar. Verduras Verduras enlatadas. Verduras congeladas con mantequilla o salsa de crema. Granos Productos elaborados con [place] y [place], como panes, pastas, bocadillos y cereales. Evite todos los alimentos procesados. Carnes y [address] de carne con alto contenido de Holiday representative. Carne de ave con piel. Carnes empanizadas o fritas. Carne procesada. Evite las grasas saturadas. Lcteos Yogur, queso o [employer]. Bebidas Bebidas azucaradas, como gaseosas o t helado. Es posible que los productos que se enumeran ms [place] no constituyan una lista completa de los alimentos y las bebidas que Personnel officer. Consulte a un nutricionista para obtener ms informacin. Preguntas para hacerle al mdico Debo consultar con un especialista certificado en atencin y educacin sobre la diabetes? Es necesario que me rena con un nutricionista? A qu nmero puedo llamar si tengo preguntas? Cules son los mejores momentos para controlar la glucemia? Dnde encontrar ms informacin: American Diabetes Association (Asociacin Estadounidense de la Diabetes): diabetes.org Academy of Nutrition and Dietetics (Academia de Nutricin y Pension scheme manager): eatright.[employer] of Diabetes and Digestive and Kidney Diseases [employer] de la Diabetes y las Enfermedades Digestivas y Renales): [email or web address].si Association of Diabetes  Care & Education Specialists (Asociacin de Especialistas en Atencin y Francella Solian la Diabetes):  diabeteseducator.org Resumen Es importante tener hbitos alimenticios saludables debido a que sus niveles de Psychologist, counselling sangre (glucosa) se ven afectados en gran medida por lo que come y bebe. Es importante consumir alcohol con prudencia. Un plan de comidas saludable lo ayudar a controlar la glucosa en sangre y a reducir el riesgo de enfermedades cardacas. El mdico puede recomendarle que trabaje con un nutricionista para elaborar el mejor plan para usted. Esta informacin no tiene Theme park manager el consejo del mdico. Asegrese de hacerle al mdico cualquier pregunta que tenga. Document Revised: 04/05/2020 Document Reviewed: 04/05/2020 Elsevier Patient Education  2024 [employer].

## 2023-08-20 NOTE — Assessment & Plan Note (Signed)
 Elevated blood pressure reading in the office but normal readings at home.  Has not taken blood pressure medication today. Continue lisinopril  20 mg daily Cardiovascular risks associated with uncontrolled hypertension discussed Advised to monitor blood pressure readings at home daily for the next several weeks and keep a log.  Advised to contact the office if numbers persistently abnormal May need to increase dose of the lisinopril  Well-controlled diabetes with hemoglobin A1c of 6.3 Continue metformin  1000 mg twice a day along with weekly Mounjaro  5 mg Diet and nutrition discussed Follow-up in 6 months
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
## 2023-08-20 NOTE — Assessment & Plan Note (Signed)
Not losing weight and not exercising as he should Advised to decrease amount of daily carbohydrate intake and daily calories and increase amount of plant-based protein in his diet.

## 2023-11-06 ENCOUNTER — Other Ambulatory Visit: Payer: Self-pay | Admitting: Emergency Medicine

## 2023-11-06 DIAGNOSIS — E1159 Type 2 diabetes mellitus with other circulatory complications: Secondary | ICD-10-CM

## 2023-11-11 ENCOUNTER — Telehealth: Payer: Self-pay

## 2023-11-11 ENCOUNTER — Other Ambulatory Visit (HOSPITAL_COMMUNITY): Payer: Self-pay

## 2023-11-11 NOTE — Telephone Encounter (Signed)
 Pharmacy Patient Advocate Encounter   Received notification from Onbase that prior authorization for [facility] 5 MG/0.5 ML PEN is required/requested.   Insurance verification completed.   The patient is insured through [facility] .   Per test claim: PA required; PA submitted to above mentioned insurance via Prompt PA Key/confirmation #/EOC [id number] Status is pending

## 2023-11-12 NOTE — Telephone Encounter (Signed)
 Pharmacy Patient Advocate Encounter  Received notification from [employer] that Prior Authorization for [facility] Reilly 5 MG/0.5 ML PEN  has been APPROVED from 11/11/2023 to 11/09/2024   PLEASE BE ADVISED APPROVAL LETTER HAS BEEN SCANNED IN MEDIA OF CHART

## 2023-12-17 ENCOUNTER — Encounter: Payer: Self-pay | Admitting: Neurology

## 2023-12-17 ENCOUNTER — Ambulatory Visit (INDEPENDENT_AMBULATORY_CARE_PROVIDER_SITE_OTHER): Admitting: Neurology

## 2023-12-17 VITALS — BP 140/89 | HR 81 | Ht 75.0 in | Wt 310.0 lb

## 2023-12-17 DIAGNOSIS — G4733 Obstructive sleep apnea (adult) (pediatric): Secondary | ICD-10-CM

## 2023-12-17 DIAGNOSIS — R634 Abnormal weight loss: Secondary | ICD-10-CM | POA: Diagnosis not present

## 2023-12-17 DIAGNOSIS — E669 Obesity, unspecified: Secondary | ICD-10-CM
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
## 2024-01-01 DIAGNOSIS — G5601 Carpal tunnel syndrome, right upper limb: Secondary | ICD-10-CM | POA: Diagnosis not present

## 2024-01-06 ENCOUNTER — Ambulatory Visit: Admitting: Neurology

## 2024-01-06 DIAGNOSIS — G4733 Obstructive sleep apnea (adult) (pediatric): Secondary | ICD-10-CM | POA: Diagnosis not present

## 2024-01-06 DIAGNOSIS — G4734 Idiopathic sleep related nonobstructive alveolar hypoventilation: Secondary | ICD-10-CM

## 2024-01-06 DIAGNOSIS — R634 Abnormal weight loss: Secondary | ICD-10-CM

## 2024-01-06 DIAGNOSIS — E669 Obesity, unspecified: Secondary | ICD-10-CM

## 2024-01-07 ENCOUNTER — Ambulatory Visit: Payer: Self-pay | Admitting: Neurology

## 2024-01-07 DIAGNOSIS — G4734 Idiopathic sleep related nonobstructive alveolar hypoventilation: Secondary | ICD-10-CM

## 2024-01-07 DIAGNOSIS — G4733 Obstructive sleep apnea (adult) (pediatric): Secondary | ICD-10-CM

## 2024-01-07 NOTE — Progress Notes (Signed)
 See procedure note.

## 2024-01-07 NOTE — Procedures (Signed)
 [facility]  HOME SLEEP TEST (Watch PAT) REPORT  STUDY DATE: 01/06/2024  DOB: 1960-11-19  MRN: [id number]  ORDERING CLINICIAN: Debbra Fairy, MD, PhD   REFERRING CLINICIAN: Elvira Hammersmith, MD   CLINICAL INFORMATION/HISTORY: 63 year old male with an underlying medical history of asthma, chest pain, diabetes, reflux disease, hypertension, hyperlipidemia, obesity, sleep apnea, and abdominal injury in the remote past, who was previously diagnosed with obstructive sleep apnea and placed on PAP therapy.  He has been compliant with his CPAP of 9 cm with EPR of 2.  He should be eligible for a new machine, he has benefited from treatment and has had good apnea control on the current settings.   Epworth sleepiness score: 2/24.  BMI: 38.8 kg/m  FINDINGS:   Sleep Summary:   Total Recording Time (hours, min): 7 hours, 28 min  Total Sleep Time (hours, min):  6 hours, 58 min  Percent REM (%):    40.7%   Respiratory Indices:   Calculated pAHI (per hour):  36.8/hour         REM pAHI:    47.9/hour       NREM pAHI: 29.4/hour  Central pAHI: 1.2/hour  Oxygen  Saturation Statistics:    Oxygen  Saturation (%) Mean: 93%   Minimum oxygen  saturation (%):                 79%   O2 Saturation Range (%): 79-99%    O2 Saturation (minutes) <=88%: 16 min  Pulse Rate Statistics:   Pulse Mean (bpm):    71/min    Pulse Range (55-109/min)   IMPRESSION:   OSA (obstructive sleep apnea), severe Nocturnal Hypoxemia  RECOMMENDATION:  This home sleep test demonstrates severe obstructive sleep apnea with a total AHI of 36.8/hour and O2 nadir of 79% with significant time below or at 88% saturation of over 15 minutes for the study, indicating nocturnal hypoxemia. Snoring was detected, in the mild to moderate range.  Ongoing treatment with positive airway pressure is highly recommended. The patient has been compliant with his CPAP of 9 cm with EPR of 2 with good apnea control  and good tolerance of treatment, ongoing benefit.  He should be eligible for which I will prescribe. A laboratory attended titration study can be considered in the future for optimization of treatment settings and to improve tolerance and compliance, if needed, down the road. Alternative treatment options are limited secondary to the severity of the patient's sleep disordered breathing, but may include surgical treatment with an implantable hypoglossal nerve stimulator (in carefully selected candidates, meeting criteria).  Concomitant weight loss is recommended (where clinically appropriate). Please note, that untreated obstructive sleep apnea may carry additional perioperative morbidity. Patients with significant obstructive sleep apnea should receive perioperative PAP therapy and the surgeons and particularly the anesthesiologist should be informed of the diagnosis and the severity of the sleep disordered breathing. The patient should be cautioned not to drive, work at heights, or operate dangerous or heavy equipment when tired or sleepy. Review and reiteration of good sleep hygiene measures should be pursued with any patient. Other causes of the patient's symptoms, including circadian rhythm disturbances, an underlying mood disorder, medication effect and/or an underlying medical problem cannot be ruled out based on this test. Clinical correlation is recommended.  The patient and his referring provider will be notified of the test results. The patient will be seen in follow up in sleep clinic at [facility].  I certify that I have reviewed the raw data recording  prior to the issuance of this report in accordance with the standards of the American Academy of Sleep Medicine (AASM).    INTERPRETING PHYSICIAN:   Debbra Fairy, MD, PhD Medical Director, [facility]) Diplomat, ABPN (Neurology and Sleep)   [facility] [address], [address] [place], [place] [address] [phone]

## 2024-01-15 NOTE — Telephone Encounter (Signed)
-----   Message from Debbra Fairy sent at 01/07/2024  6:06 PM EDT ----- Interpreter needed.    Patient has been on CPAP therapy for years, he presented for reevaluation on 12/17/2023 and had a home sleep test on 01/06/2024.  Please advise patient that his sleep test confirmed severe sleep apnea and that I recommend ongoing treatment with a CPAP machine.  He should be eligible for a new machine and I would like to write for a new CPAP machine.  Please ask patient for preference regarding his DME company.  He will need a follow-up in sleep clinic to see me or one of our nurse practitioners in 2 to 3 months after starting treatment with a new machine.

## 2024-01-15 NOTE — Progress Notes (Signed)
Order sent to [facility]  

## 2024-01-15 NOTE — Telephone Encounter (Signed)
 I used [place] interpreters ([place] [phone]) to call patient and discuss his sleep study results. The patient verbalized understanding and agrees to continue with [employer] for DME. He was made aware to watch for a call within 1 week. He was also made aware to use the machine at least 4 hours at night and see us  for follow-up 30-90 days after setup per insurance requirement. Patient was scheduled for 9/10 at 345 (asked for late afternoon appt). Pt's questions were answered.

## 2024-01-19 NOTE — Telephone Encounter (Signed)
 New, Maryella Shivers, Otilio Jefferson, RN; Alain Honey; Jeris Penta, [place]; 1 other Received, thank you!

## 2024-02-23 DIAGNOSIS — G5601 Carpal tunnel syndrome, right upper limb: Secondary | ICD-10-CM | POA: Diagnosis not present

## 2024-03-01 DIAGNOSIS — M7501 Adhesive capsulitis of right shoulder: Secondary | ICD-10-CM | POA: Diagnosis not present

## 2024-03-01 DIAGNOSIS — G5601 Carpal tunnel syndrome, right upper limb: Secondary | ICD-10-CM | POA: Diagnosis not present

## 2024-03-01 DIAGNOSIS — M25521 Pain in right elbow: Secondary | ICD-10-CM | POA: Diagnosis not present

## 2024-03-11 DIAGNOSIS — M7501 Adhesive capsulitis of right shoulder: Secondary | ICD-10-CM | POA: Diagnosis not present

## 2024-03-26 DIAGNOSIS — M25511 Pain in right shoulder: Secondary | ICD-10-CM | POA: Diagnosis not present

## 2024-03-26 DIAGNOSIS — M7501 Adhesive capsulitis of right shoulder: Secondary | ICD-10-CM | POA: Diagnosis not present

## 2024-04-05 DIAGNOSIS — M25511 Pain in right shoulder: Secondary | ICD-10-CM | POA: Diagnosis not present

## 2024-04-05 DIAGNOSIS — M7501 Adhesive capsulitis of right shoulder: Secondary | ICD-10-CM | POA: Diagnosis not present

## 2024-04-15 ENCOUNTER — Other Ambulatory Visit: Payer: Self-pay | Admitting: Emergency Medicine

## 2024-04-15 DIAGNOSIS — M7501 Adhesive capsulitis of right shoulder: Secondary | ICD-10-CM | POA: Diagnosis not present

## 2024-04-15 DIAGNOSIS — M25511 Pain in right shoulder: Secondary | ICD-10-CM | POA: Diagnosis not present

## 2024-04-15 DIAGNOSIS — I10 Essential (primary) hypertension: Secondary | ICD-10-CM

## 2024-04-20 ENCOUNTER — Telehealth: Payer: Self-pay | Admitting: Adult Health

## 2024-04-20 DIAGNOSIS — M25511 Pain in right shoulder: Secondary | ICD-10-CM | POA: Diagnosis not present

## 2024-04-20 NOTE — Telephone Encounter (Signed)
 MYC canc

## 2024-04-21 ENCOUNTER — Ambulatory Visit: Admitting: Adult Health

## 2024-06-14 NOTE — Progress Notes (Unsigned)
 [facility] Re[facility]

## 2024-06-15 ENCOUNTER — Ambulatory Visit: Admitting: Adult Health

## 2024-06-15 ENCOUNTER — Encounter: Payer: Self-pay | Admitting: Adult Health

## 2024-06-15 VITALS — BP 128/82 | HR 92 | Ht 75.0 in | Wt 300.0 lb

## 2024-06-15 DIAGNOSIS — G4733 Obstructive sleep apnea (adult) (pediatric): Secondary | ICD-10-CM

## 2024-06-15 NOTE — Patient Instructions (Addendum)
 [facility] [phone]  [facility] [phone]

## 2024-07-14 ENCOUNTER — Other Ambulatory Visit: Payer: Self-pay | Admitting: Emergency Medicine

## 2024-07-14 DIAGNOSIS — E785 Hyperlipidemia, unspecified: Secondary | ICD-10-CM

## 2024-07-14 DIAGNOSIS — E1129 Type 2 diabetes mellitus with other diabetic kidney complication: Secondary | ICD-10-CM

## 2024-07-16 DIAGNOSIS — G4733 Obstructive sleep apnea (adult) (pediatric): Secondary | ICD-10-CM | POA: Diagnosis not present

## 2024-08-31 ENCOUNTER — Encounter: Payer: Self-pay | Admitting: Emergency Medicine

## 2024-08-31 ENCOUNTER — Ambulatory Visit: Admitting: Emergency Medicine

## 2024-08-31 VITALS — BP 128/78 | HR 90 | Temp 98.2°F | Ht 75.0 in | Wt 297.0 lb

## 2024-08-31 DIAGNOSIS — E1169 Type 2 diabetes mellitus with other specified complication: Secondary | ICD-10-CM | POA: Diagnosis not present

## 2024-08-31 DIAGNOSIS — M5432 Sciatica, left side: Secondary | ICD-10-CM | POA: Diagnosis not present

## 2024-08-31 DIAGNOSIS — G4733 Obstructive sleep apnea (adult) (pediatric): Secondary | ICD-10-CM

## 2024-08-31 DIAGNOSIS — E1159 Type 2 diabetes mellitus with other circulatory complications: Secondary | ICD-10-CM | POA: Diagnosis not present

## 2024-08-31 DIAGNOSIS — I152 Hypertension secondary to endocrine disorders: Secondary | ICD-10-CM | POA: Diagnosis not present

## 2024-08-31 DIAGNOSIS — Z1211 Encounter for screening for malignant neoplasm of colon: Secondary | ICD-10-CM

## 2024-08-31 DIAGNOSIS — E785 Hyperlipidemia, unspecified: Secondary | ICD-10-CM

## 2024-08-31 DIAGNOSIS — Z7985 Long-term (current) use of injectable non-insulin antidiabetic drugs: Secondary | ICD-10-CM

## 2024-08-31 DIAGNOSIS — Z125 Encounter for screening for malignant neoplasm of prostate: Secondary | ICD-10-CM

## 2024-08-31 LAB — HEMOGLOBIN A1C: Hgb A1c MFr Bld: 6 % (ref 4.6–6.5)

## 2024-08-31 LAB — MICROALBUMIN / CREATININE URINE RATIO
Creatinine,U: 95.2 mg/dL
Microalb Creat Ratio: 13.2 mg/g (ref 0.0–30.0)
Microalb, Ur: 1.3 mg/dL (ref 0.7–1.9)

## 2024-08-31 LAB — LIPID PANEL
Cholesterol: 98 mg/dL (ref 28–200)
HDL: 32.8 mg/dL — ABNORMAL LOW
LDL Cholesterol: 42 mg/dL (ref 10–99)
NonHDL: 64.94
Total CHOL/HDL Ratio: 3
Triglycerides: 115 mg/dL (ref 10.0–149.0)
VLDL: 23 mg/dL (ref 0.0–40.0)

## 2024-08-31 LAB — CBC WITH DIFFERENTIAL/PLATELET
Basophils Absolute: 0.1 K/uL (ref 0.0–0.1)
Basophils Relative: 0.6 % (ref 0.0–3.0)
Eosinophils Absolute: 0.3 K/uL (ref 0.0–0.7)
Eosinophils Relative: 2.7 % (ref 0.0–5.0)
HCT: 45.1 % (ref 39.0–52.0)
Hemoglobin: 14.7 g/dL (ref 13.0–17.0)
Lymphocytes Relative: 29.8 % (ref 12.0–46.0)
Lymphs Abs: 3.4 K/uL (ref 0.7–4.0)
MCHC: 32.7 g/dL (ref 30.0–36.0)
MCV: 81.8 fl (ref 78.0–100.0)
Monocytes Absolute: 1 K/uL (ref 0.1–1.0)
Monocytes Relative: 8.6 % (ref 3.0–12.0)
Neutro Abs: 6.7 K/uL (ref 1.4–7.7)
Neutrophils Relative %: 58.3 % (ref 43.0–77.0)
Platelets: 280 K/uL (ref 150.0–400.0)
RBC: 5.51 Mil/uL (ref 4.22–5.81)
RDW: 14.6 % (ref 11.5–15.5)
WBC: 11.4 K/uL — ABNORMAL HIGH (ref 4.0–10.5)

## 2024-08-31 LAB — COMPREHENSIVE METABOLIC PANEL WITH GFR
ALT: 19 U/L (ref 3–53)
AST: 12 U/L (ref 5–37)
Albumin: 4.1 g/dL (ref 3.5–5.2)
Alkaline Phosphatase: 62 U/L (ref 39–117)
BUN: 9 mg/dL (ref 6–23)
CO2: 29 meq/L (ref 19–32)
Calcium: 9.3 mg/dL (ref 8.4–10.5)
Chloride: 103 meq/L (ref 96–112)
Creatinine, Ser: 0.67 mg/dL (ref 0.40–1.50)
GFR: 98.3 mL/min
Glucose, Bld: 111 mg/dL — ABNORMAL HIGH (ref 70–99)
Potassium: 4.2 meq/L (ref 3.5–5.1)
Sodium: 139 meq/L (ref 135–145)
Total Bilirubin: 0.4 mg/dL (ref 0.2–1.2)
Total Protein: 6.9 g/dL (ref 6.0–8.3)

## 2024-08-31 LAB — PSA: PSA: 0.14 ng/mL (ref 0.10–4.00)

## 2024-08-31 NOTE — Assessment & Plan Note (Signed)
 Recent flareup.  Clinically stable.  No red flag signs or symptoms Mechanical in nature Responded well to medication Asymptomatic today Pain management discussed May need referral to orthopedics in the future

## 2024-08-31 NOTE — Assessment & Plan Note (Signed)
Stable chronic conditions Continue simvastatin 40 mg daily Nutrition discussed Lipid profile done today

## 2024-08-31 NOTE — Assessment & Plan Note (Signed)
 BP Readings from Last 3 Encounters:  08/31/24 (!) 140/82  06/15/24 128/82  12/17/23 (!) 140/89  Elevated blood pressure reading in the office but normal readings at home.  Has not taken blood pressure medication today. Continue lisinopril  20 mg daily Cardiovascular risks associated with uncontrolled hypertension discussed Advised to monitor blood pressure readings at home daily for the next several weeks and keep a log.  Advised to contact the office if numbers persistently abnormal May need to increase dose of the lisinopril  Well-controlled diabetes with hemoglobin A1c of 6.3 Continue metformin  1000 mg twice a day along with weekly Mounjaro  5 mg Diet and nutrition discussed Follow-up in 6 months

## 2024-08-31 NOTE — Assessment & Plan Note (Signed)
 Wt Readings from Last 3 Encounters:  08/31/24 297 lb (134.7 kg)  06/15/24 300 lb (136.1 kg)  12/17/23 (!) 310 lb (140.6 kg)  Eating better and losing weight Continues weekly Mounjaro  5 mg.  Does not want to increase dose Diet and nutrition discussed Advised to decrease amount of daily carbohydrate intake and daily calories and increase amount of plant-based protein in his diet Benefits of exercise discussed

## 2024-08-31 NOTE — Assessment & Plan Note (Signed)
Stable.  Continue CPAP treatment.
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
## 2024-08-31 NOTE — Patient Instructions (Signed)
 Health Maintenance After Age 65 After age 27, you are at a higher risk for certain long-term diseases and infections as well as injuries from falls. Falls are a major cause of broken bones and head injuries in people who are older than age 73. Getting regular preventive care can help to keep you healthy and well. Preventive care includes getting regular testing and making lifestyle changes as recommended by your health care provider. Talk with your health care provider about: Which screenings and tests you should have. A screening is a test that checks for a disease when you have no symptoms. A diet and exercise plan that is right for you. What should I know about screenings and tests to prevent falls? Screening and testing are the best ways to find a health problem early. Early diagnosis and treatment give you the best chance of managing medical conditions that are common after age 90. Certain conditions and lifestyle choices may make you more likely to have a fall. Your health care provider may recommend: Regular vision checks. Poor vision and conditions such as cataracts can make you more likely to have a fall. If you wear glasses, make sure to get your prescription updated if your vision changes. Medicine review. Work with your health care provider to regularly review all of the medicines you are taking, including over-the-counter medicines. Ask your health care provider about any side effects that may make you more likely to have a fall. Tell your health care provider if any medicines that you take make you feel dizzy or sleepy. Strength and balance checks. Your health care provider may recommend certain tests to check your strength and balance while standing, walking, or changing positions. Foot health exam. Foot pain and numbness, as well as not wearing proper footwear, can make you more likely to have a fall. Screenings, including: Osteoporosis screening. Osteoporosis is a condition that causes  the bones to get weaker and break more easily. Blood pressure screening. Blood pressure changes and medicines to control blood pressure can make you feel dizzy. Depression screening. You may be more likely to have a fall if you have a fear of falling, feel depressed, or feel unable to do activities that you used to do. Alcohol  use screening. Using too much alcohol  can affect your balance and may make you more likely to have a fall. Follow these instructions at home: Lifestyle Do not drink alcohol  if: Your health care provider tells you not to drink. If you drink alcohol : Limit how much you have to: 0-1 drink a day for women. 0-2 drinks a day for men. Know how much alcohol  is in your drink. In the [place] one drink equals one 12 oz bottle of beer (355 mL), one 5 oz glass of wine (148 mL), or one 1 oz glass of hard liquor (44 mL). Do not use any products that contain nicotine or tobacco. These products include cigarettes, chewing tobacco, and vaping devices, such as e-cigarettes. If you need help quitting, ask your health care provider. Activity  Follow a regular exercise program to stay fit. This will help you maintain your balance. Ask your health care provider what types of exercise are appropriate for you. If you need a cane or walker, use it as recommended by your health care provider. Wear supportive shoes that have nonskid soles. Safety  Remove any tripping hazards, such as rugs, cords, and clutter. Install safety equipment such as grab bars in bathrooms and safety rails on stairs. Keep rooms and walkways  well-lit. General instructions Talk with your health care provider about your risks for falling. Tell your health care provider if: You fall. Be sure to tell your health care provider about all falls, even ones that seem minor. You feel dizzy, tiredness (fatigue), or off-balance. Take over-the-counter and prescription medicines only as told by your health care provider. These include  supplements. Eat a healthy diet and maintain a healthy weight. A healthy diet includes low-fat dairy products, low-fat (lean) meats, and fiber from whole grains, beans, and lots of fruits and vegetables. Stay current with your vaccines. Schedule regular health, dental, and eye exams. Summary Having a healthy lifestyle and getting preventive care can help to protect your health and wellness after age 15. Screening and testing are the best way to find a health problem early and help you avoid having a fall. Early diagnosis and treatment give you the best chance for managing medical conditions that are more common for people who are older than age 42. Falls are a major cause of broken bones and head injuries in people who are older than age 64. Take precautions to prevent a fall at home. Work with your health care provider to learn what changes you can make to improve your health and wellness and to prevent falls. This information is not intended to replace advice given to you by your health care provider. Make sure you discuss any questions you have with your health care provider. Document Revised: 12/18/2020 Document Reviewed: 12/18/2020 Elsevier Patient Education  2024 [employer].

## 2024-09-01 ENCOUNTER — Ambulatory Visit: Payer: Self-pay | Admitting: Emergency Medicine
